# Patient Record
Sex: Male | Born: 1965 | Race: Black or African American | Hispanic: No | Marital: Single | Smoking: Never smoker
Health system: Southern US, Community
[De-identification: ages and names within clinical notes are randomized; demographics above are authoritative.]

## PROBLEM LIST (undated history)

## (undated) DIAGNOSIS — E119 Type 2 diabetes mellitus without complications: Secondary | ICD-10-CM

## (undated) DIAGNOSIS — R51 Headache: Secondary | ICD-10-CM

## (undated) DIAGNOSIS — K219 Gastro-esophageal reflux disease without esophagitis: Secondary | ICD-10-CM

## (undated) DIAGNOSIS — I10 Essential (primary) hypertension: Secondary | ICD-10-CM

## (undated) DIAGNOSIS — E785 Hyperlipidemia, unspecified: Secondary | ICD-10-CM

## (undated) HISTORY — DX: Hyperlipidemia, unspecified: E78.5

## (undated) HISTORY — PX: HERNIA REPAIR: SHX51

## (undated) HISTORY — DX: Gastro-esophageal reflux disease without esophagitis: K21.9

---

## 1998-07-25 ENCOUNTER — Emergency Department (HOSPITAL_COMMUNITY): Admission: EM | Admit: 1998-07-25 | Discharge: 1998-07-25 | Payer: Self-pay | Admitting: Emergency Medicine

## 1999-08-30 ENCOUNTER — Emergency Department (HOSPITAL_COMMUNITY): Admission: EM | Admit: 1999-08-30 | Discharge: 1999-08-30 | Payer: Self-pay | Admitting: Emergency Medicine

## 1999-10-03 ENCOUNTER — Emergency Department (HOSPITAL_COMMUNITY): Admission: EM | Admit: 1999-10-03 | Discharge: 1999-10-03 | Payer: Self-pay

## 2000-02-15 ENCOUNTER — Emergency Department (HOSPITAL_COMMUNITY): Admission: EM | Admit: 2000-02-15 | Discharge: 2000-02-16 | Payer: Self-pay | Admitting: Emergency Medicine

## 2000-02-15 ENCOUNTER — Encounter: Payer: Self-pay | Admitting: Emergency Medicine

## 2000-10-26 ENCOUNTER — Emergency Department (HOSPITAL_COMMUNITY): Admission: EM | Admit: 2000-10-26 | Discharge: 2000-10-26 | Payer: Self-pay | Admitting: Emergency Medicine

## 2001-06-11 ENCOUNTER — Emergency Department (HOSPITAL_COMMUNITY): Admission: EM | Admit: 2001-06-11 | Discharge: 2001-06-11 | Payer: Self-pay | Admitting: Emergency Medicine

## 2003-04-22 ENCOUNTER — Emergency Department (HOSPITAL_COMMUNITY): Admission: EM | Admit: 2003-04-22 | Discharge: 2003-04-22 | Payer: Self-pay | Admitting: Emergency Medicine

## 2004-02-28 ENCOUNTER — Emergency Department (HOSPITAL_COMMUNITY): Admission: EM | Admit: 2004-02-28 | Discharge: 2004-02-29 | Payer: Self-pay | Admitting: Emergency Medicine

## 2005-06-06 ENCOUNTER — Emergency Department (HOSPITAL_COMMUNITY): Admission: EM | Admit: 2005-06-06 | Discharge: 2005-06-06 | Payer: Self-pay | Admitting: Emergency Medicine

## 2007-03-12 ENCOUNTER — Emergency Department (HOSPITAL_COMMUNITY): Admission: EM | Admit: 2007-03-12 | Discharge: 2007-03-12 | Payer: Self-pay | Admitting: Emergency Medicine

## 2007-03-17 ENCOUNTER — Emergency Department (HOSPITAL_COMMUNITY): Admission: EM | Admit: 2007-03-17 | Discharge: 2007-03-17 | Payer: Self-pay | Admitting: Emergency Medicine

## 2007-04-06 ENCOUNTER — Encounter: Admission: RE | Admit: 2007-04-06 | Discharge: 2007-04-06 | Payer: Self-pay | Admitting: Cardiology

## 2007-04-23 ENCOUNTER — Emergency Department (HOSPITAL_COMMUNITY): Admission: EM | Admit: 2007-04-23 | Discharge: 2007-04-23 | Payer: Self-pay | Admitting: Emergency Medicine

## 2009-02-18 ENCOUNTER — Emergency Department (HOSPITAL_COMMUNITY): Admission: EM | Admit: 2009-02-18 | Discharge: 2009-02-19 | Payer: Self-pay | Admitting: Emergency Medicine

## 2009-02-25 ENCOUNTER — Ambulatory Visit: Payer: Self-pay | Admitting: Family Medicine

## 2009-02-25 DIAGNOSIS — N400 Enlarged prostate without lower urinary tract symptoms: Secondary | ICD-10-CM

## 2009-02-25 DIAGNOSIS — F329 Major depressive disorder, single episode, unspecified: Secondary | ICD-10-CM

## 2009-02-25 DIAGNOSIS — E785 Hyperlipidemia, unspecified: Secondary | ICD-10-CM | POA: Insufficient documentation

## 2009-02-25 DIAGNOSIS — I10 Essential (primary) hypertension: Secondary | ICD-10-CM

## 2009-03-26 ENCOUNTER — Encounter (INDEPENDENT_AMBULATORY_CARE_PROVIDER_SITE_OTHER): Payer: Self-pay | Admitting: Family Medicine

## 2009-03-26 ENCOUNTER — Ambulatory Visit: Payer: Self-pay | Admitting: Internal Medicine

## 2009-03-26 LAB — CONVERTED CEMR LAB
ALT: 18 units/L (ref 0–53)
AST: 16 units/L (ref 0–37)
Albumin: 4.5 g/dL (ref 3.5–5.2)
Alkaline Phosphatase: 61 units/L (ref 39–117)
BUN: 11 mg/dL (ref 6–23)
Basophils Absolute: 0 10*3/uL (ref 0.0–0.1)
Basophils Relative: 0 % (ref 0–1)
Bilirubin, Direct: 0.1 mg/dL (ref 0.0–0.3)
CO2: 27 meq/L (ref 19–32)
Calcium: 9.9 mg/dL (ref 8.4–10.5)
Chloride: 103 meq/L (ref 96–112)
Cholesterol: 140 mg/dL (ref 0–200)
Creatinine, Ser: 1.4 mg/dL (ref 0.40–1.50)
Eosinophils Absolute: 0.1 10*3/uL (ref 0.0–0.7)
Eosinophils Relative: 2 % (ref 0–5)
Glucose, Bld: 120 mg/dL — ABNORMAL HIGH (ref 70–99)
HCT: 43.8 % (ref 39.0–52.0)
HDL: 28 mg/dL — ABNORMAL LOW (ref >39)
Hemoglobin: 15.6 g/dL (ref 13.0–17.0)
Indirect Bilirubin: 0.5 mg/dL (ref 0.0–0.9)
LDL Cholesterol: 65 mg/dL (ref 0–99)
Lymphocytes Relative: 24 % (ref 12–46)
Lymphs Abs: 1.1 10*3/uL (ref 0.7–4.0)
MCHC: 35.6 g/dL (ref 30.0–36.0)
MCV: 91.8 fL (ref 78.0–100.0)
Monocytes Absolute: 0.3 10*3/uL (ref 0.1–1.0)
Monocytes Relative: 7 % (ref 3–12)
Neutro Abs: 3.2 10*3/uL (ref 1.7–7.7)
Neutrophils Relative %: 67 % (ref 43–77)
PSA: 1.95 ng/mL (ref 0.10–4.00)
Platelets: 224 10*3/uL (ref 150–400)
Potassium: 4.2 meq/L (ref 3.5–5.3)
RBC: 4.77 M/uL (ref 4.22–5.81)
RDW: 13.3 % (ref 11.5–15.5)
Sodium: 142 meq/L (ref 135–145)
TSH: 1.321 microintl units/mL (ref 0.350–4.500)
Total Bilirubin: 0.6 mg/dL (ref 0.3–1.2)
Total CHOL/HDL Ratio: 5
Total Protein: 7.2 g/dL (ref 6.0–8.3)
Triglycerides: 237 mg/dL — ABNORMAL HIGH (ref <150)
VLDL: 47 mg/dL — ABNORMAL HIGH (ref 0–40)
WBC: 4.8 10*3/uL (ref 4.0–10.5)

## 2009-03-28 ENCOUNTER — Ambulatory Visit: Payer: Self-pay | Admitting: Family Medicine

## 2009-03-28 DIAGNOSIS — K219 Gastro-esophageal reflux disease without esophagitis: Secondary | ICD-10-CM

## 2009-03-28 LAB — CONVERTED CEMR LAB: Hgb A1c MFr Bld: 5.5 % (ref 4.6–6.1)

## 2009-04-16 ENCOUNTER — Ambulatory Visit: Payer: Self-pay | Admitting: Internal Medicine

## 2009-05-02 ENCOUNTER — Ambulatory Visit: Payer: Self-pay | Admitting: Internal Medicine

## 2009-05-03 ENCOUNTER — Ambulatory Visit: Payer: Self-pay | Admitting: Internal Medicine

## 2009-05-20 ENCOUNTER — Ambulatory Visit: Payer: Self-pay | Admitting: Internal Medicine

## 2009-05-20 ENCOUNTER — Encounter (INDEPENDENT_AMBULATORY_CARE_PROVIDER_SITE_OTHER): Payer: Self-pay | Admitting: Family Medicine

## 2009-05-20 LAB — CONVERTED CEMR LAB
Helicobacter Pylori Antibody-IgG: 0.5
Testosterone: 372.21 ng/dL (ref 350–890)

## 2009-05-28 ENCOUNTER — Ambulatory Visit: Payer: Self-pay | Admitting: Internal Medicine

## 2009-06-17 ENCOUNTER — Ambulatory Visit: Payer: Self-pay | Admitting: Internal Medicine

## 2009-07-29 ENCOUNTER — Ambulatory Visit: Payer: Self-pay | Admitting: Internal Medicine

## 2009-08-10 ENCOUNTER — Emergency Department (HOSPITAL_COMMUNITY): Admission: EM | Admit: 2009-08-10 | Discharge: 2009-08-11 | Payer: Self-pay | Admitting: Emergency Medicine

## 2009-08-15 ENCOUNTER — Emergency Department (HOSPITAL_COMMUNITY): Admission: EM | Admit: 2009-08-15 | Discharge: 2009-08-15 | Payer: Self-pay | Admitting: Emergency Medicine

## 2009-10-07 ENCOUNTER — Emergency Department (HOSPITAL_COMMUNITY): Admission: EM | Admit: 2009-10-07 | Discharge: 2009-10-08 | Payer: Self-pay | Admitting: Emergency Medicine

## 2009-10-10 ENCOUNTER — Ambulatory Visit: Payer: Self-pay | Admitting: Internal Medicine

## 2009-11-15 ENCOUNTER — Emergency Department (HOSPITAL_COMMUNITY): Admission: EM | Admit: 2009-11-15 | Discharge: 2009-11-16 | Payer: Self-pay | Admitting: Emergency Medicine

## 2009-11-17 ENCOUNTER — Emergency Department (HOSPITAL_COMMUNITY): Admission: EM | Admit: 2009-11-17 | Discharge: 2009-11-17 | Payer: Self-pay | Admitting: Emergency Medicine

## 2009-12-26 ENCOUNTER — Encounter (INDEPENDENT_AMBULATORY_CARE_PROVIDER_SITE_OTHER): Payer: Self-pay | Admitting: Family Medicine

## 2009-12-26 LAB — CONVERTED CEMR LAB
BUN: 12 mg/dL (ref 6–23)
CO2: 30 meq/L (ref 19–32)
Calcium: 9.8 mg/dL (ref 8.4–10.5)
Chloride: 102 meq/L (ref 96–112)
Creatinine, Ser: 1.34 mg/dL (ref 0.40–1.50)
Glucose, Bld: 97 mg/dL (ref 70–99)
Hgb A1c MFr Bld: 5.6 % (ref <5.7)
Potassium: 4.3 meq/L (ref 3.5–5.3)
Sodium: 142 meq/L (ref 135–145)

## 2010-02-18 ENCOUNTER — Emergency Department (HOSPITAL_COMMUNITY)
Admission: EM | Admit: 2010-02-18 | Discharge: 2010-02-18 | Payer: Self-pay | Source: Home / Self Care | Admitting: Family Medicine

## 2010-04-13 ENCOUNTER — Encounter: Payer: Self-pay | Admitting: Cardiology

## 2010-04-22 NOTE — Letter (Signed)
Summary: SLIDING SCALE FEE  SLIDING SCALE FEE   Imported By: Arta Bruce 04/15/2009 16:58:47  _____________________________________________________________________  External Attachment:    Type:   Image     Comment:   External Document

## 2010-04-22 NOTE — Miscellaneous (Signed)
Summary: Office Visit ([facility])    Primary Provider:  Carolyne Fiscal   History of Present Illness: H/o GERD, some substernal chest discomfort after heavy meals while sitting around. Not active, no CP when active.  H/o chronic prostatitis. 2 days left on 4 weeks of Cipro, pt states improved. Recent PSA 1.95. Continues with hesitancy, dribbling, poor flow, polyuria, q 2 hours, no nocturia.  Allergies: No Known Drug Allergies  Past History:  Past Medical History: Last updated: 02/25/2009 Hypertension Hyperlipidemia Depression  Past Surgical History: Last updated: 02/25/2009 Umbilical hernia repair as a child.  Family History: Last updated: 02/25/2009 Father alive with Diabetes Mother alive with Hyperlipidemia Family History Diabetes 1st degree relative  Social History: Last updated: 02/25/2009 Occupation: Unemployed Lives with parents currently, good support Single Never Smoked Alcohol use-no Drug use-no Regular exercise-no  Risk Factors: Caffeine Use: 5-10 cups daily (02/25/2009) Exercise: no (02/25/2009)  Risk Factors: Smoking Status: never (02/25/2009) Passive Smoke Exposure: no (02/25/2009)   Review of Systems General:  Denies chills and fever. GU:  Complains of erectile dysfunction, urinary frequency, and urinary hesitancy; denies discharge, dysuria, genital sores, hematuria, incontinence, and nocturia.   Physical Exam  General:  Well-developed,well-nourished,in no acute distress; alert,appropriate and cooperative throughout examination Head:  Normocephalic and atraumatic without obvious abnormalities. No apparent alopecia or balding. Eyes:  No corneal or conjunctival inflammation noted. EOMI. Perrla.  Ears:  External ear exam shows no significant lesions or deformities.  Otoscopic examination reveals clear canals, tympanic membranes are intact bilaterally without bulging, retraction, inflammation or discharge. Hearing is grossly normal bilaterally. Nose:   External nasal examination shows no deformity or inflammation. Nasal mucosa are pink and moist without lesions or exudates. Mouth:  Oral mucosa and oropharynx without lesions or exudates.  Teeth in good repair. Lungs:  Normal respiratory effort, chest expands symmetrically. Lungs are clear to auscultation, no crackles or wheezes. Heart:  Normal rate and regular rhythm. S1 and S2 normal without gallop, murmur, click, rub or other extra sounds. Abdomen:  Bowel sounds positive,abdomen soft and non-tender without masses, organomegaly or hernias noted.   Problems:  Medical Problems Added: 1)  Dx of Gerd  (ICD-530.81)  Impression & Recommendations:  Problem # 1:  CHRONIC PROSTATITIS (ICD-601.1) Finishing 28 day course of cipro with some improvement. Begin Cardura 4mg  today. Cautions given re: SE.  Problem # 2:  HYPERLIPIDEMIA (ICD-272.4) Diet and exercise. Increase whole plants, decrease meat, esp red meats. Exercise daily. Repeat Lipids after 6months.  Problem # 3:  FAMILY HISTORY DIABETES 1ST DEGREE RELATIVE (ICD-V18.0) FBG 120 recently. Hgb A1c today.  Problem # 4:  GERD (ICD-530.81)  Omeprazole 20mg  one month trial, then trial off with food avoidance.  His updated medication list for this problem includes:    Omeprazole 20 Mg Cpdr (Omeprazole) .Marland Kitchen... 1 by mouth daily  Complete Medication List: 1)  Cipro 500 Mg Tabs (Ciprofloxacin hcl) .Marland Kitchen.. 1 by mouth two times a day x 28 days 2)  Cardura 4 Mg Tabs (Doxazosin mesylate) .Marland Kitchen.. 1 by mouth qam 3)  Omeprazole 20 Mg Cpdr (Omeprazole) .Marland Kitchen.. 1 by mouth daily  Prescriptions: OMEPRAZOLE 20 MG CPDR (OMEPRAZOLE) 1 by mouth daily  #30 x 0   Entered and Authorized by:   Syliva Overman MD   Signed by:   Syliva Overman MD on 03/28/2009   Method used:   Print then Give to Patient   RxID:   [id number] CARDURA 4 MG TABS (DOXAZOSIN MESYLATE) 1 by mouth qAM  #30 x 2  Entered and Authorized by:   Syliva Overman MD   Signed by:   Syliva Overman MD on  03/28/2009   Method used:   Print then Give to Patient   RxID:   [id number]

## 2010-04-22 NOTE — Progress Notes (Signed)
Summary: Office Visit//DEPRESSION SCREENING  Office Visit//DEPRESSION SCREENING   Imported By: Arta Bruce 04/15/2009 16:59:38  _____________________________________________________________________  External Attachment:    Type:   Image     Comment:   External Document

## 2010-04-22 NOTE — Letter (Signed)
Summary: PT INFORMATION SHEET  PT INFORMATION SHEET   Imported By: Arta Bruce 04/15/2009 16:56:36  _____________________________________________________________________  External Attachment:    Type:   Image     Comment:   External Document

## 2010-05-13 ENCOUNTER — Encounter (INDEPENDENT_AMBULATORY_CARE_PROVIDER_SITE_OTHER): Payer: Self-pay | Admitting: *Deleted

## 2010-05-13 LAB — CONVERTED CEMR LAB
Cholesterol: 140 mg/dL (ref 0–200)
HDL: 29 mg/dL — ABNORMAL LOW (ref >39)
LDL Cholesterol: 68 mg/dL (ref 0–99)
PSA: 1.49 ng/mL (ref <=4.00)
Total CHOL/HDL Ratio: 4.8
Triglycerides: 215 mg/dL — ABNORMAL HIGH (ref <150)
VLDL: 43 mg/dL — ABNORMAL HIGH (ref 0–40)

## 2010-06-09 LAB — CULTURE, ROUTINE-ABSCESS

## 2010-06-25 LAB — URINALYSIS, ROUTINE W REFLEX MICROSCOPIC
Glucose, UA: NEGATIVE mg/dL
Ketones, ur: NEGATIVE mg/dL
Protein, ur: NEGATIVE mg/dL

## 2010-09-27 ENCOUNTER — Emergency Department (HOSPITAL_COMMUNITY)
Admission: EM | Admit: 2010-09-27 | Discharge: 2010-09-28 | Disposition: A | Discharge status: Home / Self Care | Payer: Self-pay | Attending: Emergency Medicine | Admitting: Emergency Medicine

## 2010-09-27 DIAGNOSIS — R300 Dysuria: Secondary | ICD-10-CM | POA: Insufficient documentation

## 2010-09-27 DIAGNOSIS — N419 Inflammatory disease of prostate, unspecified: Secondary | ICD-10-CM | POA: Insufficient documentation

## 2010-09-28 LAB — URINALYSIS, ROUTINE W REFLEX MICROSCOPIC
Bilirubin Urine: NEGATIVE
Glucose, UA: NEGATIVE mg/dL
Ketones, ur: NEGATIVE mg/dL
Nitrite: NEGATIVE
pH: 6 (ref 5.0–8.0)

## 2010-09-28 LAB — URINE MICROSCOPIC-ADD ON

## 2010-09-29 LAB — URINE CULTURE
Colony Count: NO GROWTH
Culture  Setup Time: 201207081126

## 2010-12-04 ENCOUNTER — Emergency Department (HOSPITAL_COMMUNITY)
Admission: EM | Admit: 2010-12-04 | Discharge: 2010-12-04 | Disposition: A | Discharge status: Home / Self Care | Payer: Self-pay | Attending: Emergency Medicine | Admitting: Emergency Medicine

## 2010-12-04 DIAGNOSIS — K219 Gastro-esophageal reflux disease without esophagitis: Secondary | ICD-10-CM | POA: Insufficient documentation

## 2010-12-04 DIAGNOSIS — M549 Dorsalgia, unspecified: Secondary | ICD-10-CM | POA: Insufficient documentation

## 2010-12-11 LAB — GC/CHLAMYDIA PROBE AMP, GENITAL: Chlamydia, DNA Probe: NEGATIVE

## 2010-12-11 LAB — URINALYSIS, ROUTINE W REFLEX MICROSCOPIC
Bilirubin Urine: NEGATIVE
Glucose, UA: NEGATIVE
Hgb urine dipstick: NEGATIVE
Specific Gravity, Urine: 1.02
pH: 6.5

## 2010-12-26 LAB — URINALYSIS, ROUTINE W REFLEX MICROSCOPIC
Bilirubin Urine: NEGATIVE
Glucose, UA: NEGATIVE
Hgb urine dipstick: NEGATIVE
Ketones, ur: NEGATIVE
Nitrite: NEGATIVE
Protein, ur: NEGATIVE
Specific Gravity, Urine: 1.023
Urobilinogen, UA: 0.2
pH: 6

## 2010-12-26 LAB — GC/CHLAMYDIA PROBE AMP, GENITAL
Chlamydia, DNA Probe: NEGATIVE
GC Probe Amp, Genital: NEGATIVE

## 2010-12-26 LAB — URINE CULTURE
Colony Count: NO GROWTH
Culture: NO GROWTH

## 2011-03-07 ENCOUNTER — Emergency Department (INDEPENDENT_AMBULATORY_CARE_PROVIDER_SITE_OTHER)
Admission: EM | Admit: 2011-03-07 | Discharge: 2011-03-07 | Disposition: A | Discharge status: Home / Self Care | Payer: BC Managed Care – PPO | Source: Home / Self Care | Attending: Family Medicine | Admitting: Family Medicine

## 2011-03-07 ENCOUNTER — Encounter: Payer: Self-pay | Admitting: *Deleted

## 2011-03-07 DIAGNOSIS — T50905A Adverse effect of unspecified drugs, medicaments and biological substances, initial encounter: Secondary | ICD-10-CM

## 2011-03-07 DIAGNOSIS — I10 Essential (primary) hypertension: Secondary | ICD-10-CM

## 2011-03-07 DIAGNOSIS — T887XXA Unspecified adverse effect of drug or medicament, initial encounter: Secondary | ICD-10-CM

## 2011-03-07 HISTORY — DX: Essential (primary) hypertension: I10

## 2011-03-07 LAB — POCT I-STAT, CHEM 8
BUN: 11 mg/dL (ref 6–23)
Calcium, Ion: 1.19 mmol/L (ref 1.12–1.32)
Chloride: 103 mEq/L (ref 96–112)
Potassium: 4.2 mEq/L (ref 3.5–5.1)

## 2011-03-07 MED ORDER — DOXAZOSIN MESYLATE 8 MG PO TABS: 4.0000 mg | ORAL_TABLET | Freq: Every day | ORAL | Status: DC

## 2011-03-07 NOTE — ED Notes (Signed)
Pt with c/o dizziness and headache onset Monday - per pt had prescription transferred from one pharmacy to another realized was taking 8 mg rather than 4 mg stopped taking medication x 2 days ago

## 2011-03-07 NOTE — ED Provider Notes (Signed)
History     CSN: [id number] Arrival date & time: 03/07/2011  1:15 PM   First MD Initiated Contact with Patient 03/07/11 1230      Chief Complaint  Patient presents with  . Headache    per pt refilled doxazosin 8mg  on 12/3 started taking 12/4 started feeling dizzy 12/10 - stopped taking med thursday called pharmacy told had been taking 4mg  everyday increased to  8 - pharmacist told pt to take 1/2 tablet and call his doctor on monday   . Dizziness    (Consider location/radiation/quality/duration/timing/severity/associated sxs/prior treatment) Patient is a 45 y.o. male presenting with headaches. The history is provided by the patient.  Headache The primary symptoms include headaches. Primary symptoms do not include dizziness, focal weakness, memory loss, fever, nausea or vomiting. The symptoms began 3 to 5 days ago (found to be given higher dose of medicine from pharmacy. ). The symptoms are improving.  Medical issues also include hypertension.    Past Medical History  Diagnosis Date  . Hypertension     History reviewed. No pertinent past surgical history.  History reviewed. No pertinent family history.  History  Substance Use Topics  . Smoking status: Never Smoker   . Smokeless tobacco: Not on file  . Alcohol Use: No      Review of Systems  Constitutional: Negative for fever.  Respiratory: Negative.   Cardiovascular: Negative.   Gastrointestinal: Negative.  Negative for nausea and vomiting.  Neurological: Positive for headaches. Negative for dizziness and focal weakness.  Psychiatric/Behavioral: Negative for memory loss.    Allergies  Review of patient's allergies indicates no known allergies.  Home Medications   Current Outpatient Rx  Name Route Sig Dispense Refill  . IBUPROFEN 800 MG PO TABS Oral Take 800 mg by mouth every 8 (eight) hours as needed.      Marland Kitchen DOXAZOSIN MESYLATE 8 MG PO TABS Oral Take 0.5 tablets (4 mg total) by mouth at bedtime. 30 tablet 0     BP 126/70  Pulse 68  Temp(Src) 98.4 F (36.9 C) (Oral)  Resp 16  SpO2 96%  Physical Exam  Nursing note and vitals reviewed. Constitutional: He is oriented to person, place, and time. He appears well-developed and well-nourished.  HENT:  Head: Normocephalic.  Right Ear: External ear normal.  Left Ear: External ear normal.  Mouth/Throat: Oropharynx is clear and moist.  Eyes: Conjunctivae and EOM are normal. Pupils are equal, round, and reactive to light.  Neck: Normal range of motion. Neck supple.  Cardiovascular: Normal rate, normal heart sounds and intact distal pulses.   Pulmonary/Chest: Effort normal and breath sounds normal.  Musculoskeletal: He exhibits no edema.  Lymphadenopathy:    He has no cervical adenopathy.  Neurological: He is alert and oriented to person, place, and time.  Skin: Skin is warm and dry.  Psychiatric: He has a normal mood and affect.    ED Course  Procedures (including critical care time)  Labs Reviewed  POCT I-STAT, CHEM 8 - Abnormal; Notable for the following:    Creatinine, Ser 1.40 (*)    Glucose, Bld 105 (*)    All other components within normal limits  I-STAT, CHEM 8   No results found.   1. Hypertension   2. Medication adverse effect       MDM  i-stat wnl except for chronic increased cr.        Barkley Bruns, MD 03/07/11 [phone]

## 2012-08-01 ENCOUNTER — Emergency Department (HOSPITAL_COMMUNITY)
Admission: EM | Admit: 2012-08-01 | Discharge: 2012-08-01 | Disposition: A | Discharge status: Home / Self Care | Payer: BC Managed Care – PPO | Source: Home / Self Care | Attending: Emergency Medicine | Admitting: Emergency Medicine

## 2012-08-01 ENCOUNTER — Encounter (HOSPITAL_COMMUNITY): Payer: Self-pay | Admitting: *Deleted

## 2012-08-01 MED ORDER — GI COCKTAIL ~~LOC~~
ORAL | Status: AC
  Filled 2012-08-01: qty 30

## 2012-08-01 MED ORDER — GI COCKTAIL ~~LOC~~
30.0000 mL | Freq: Once | ORAL | Status: AC
  Administered 2012-08-01: 30 mL via ORAL

## 2012-08-01 MED ORDER — OMEPRAZOLE 20 MG PO CPDR: 20.0000 mg | DELAYED_RELEASE_CAPSULE | Freq: Two times a day (BID) | ORAL | Status: DC

## 2012-08-01 MED ORDER — SUCRALFATE 1 GM/10ML PO SUSP: 1.0000 g | Freq: Four times a day (QID) | ORAL | Status: DC

## 2012-08-01 MED ORDER — RANITIDINE HCL 150 MG PO TABS: 150.0000 mg | ORAL_TABLET | Freq: Two times a day (BID) | ORAL | Status: DC

## 2012-08-01 NOTE — ED Provider Notes (Signed)
Chief Complaint:   Chief Complaint  Patient presents with  . Abdominal Pain    History of Present Illness:    Austin Valencia is a 47 year old male who has had a four-day history of difficulty swallowing. He describes pain in the upper sternal area with swelling of any solid food or burping. This does not occur with swallowing of liquids or saliva. The pain does not radiate. He denies any dysphagia, nausea, or vomiting. He's had a history of indigestion and heartburn. He has been on Zantac for this, but ran out of medication recently. He denies any sores on his lips or in his mouth. He is in the habit of dry swallowing his pills, and one of the pills that he does try to swallow without any water is potassium. He also takes ibuprofen. He drinks occasional alcohol and does not use any tobacco or nicotine products. He's had no fever, chills, sore throat, adenopathy, difficulty breathing, pain with inspiration, palpitations, dizziness, abdominal pain, or evidence of GI bleeding.  Review of Systems:  Other than noted above, the patient denies any of the following symptoms: Constitutional:  No fever, chills, fatigue, weight loss or anorexia. Lungs:  No cough or shortness of breath. Heart:  No chest pain, palpitations, syncope or edema.  No cardiac history. Abdomen:  No nausea, vomiting, hematememesis, melena, diarrhea, or hematochezia. GU:  No dysuria, frequency, urgency, or hematuria.  No testicular pain or swelling.  PMFSH:  Past medical history, family history, social history, meds, and allergies were reviewed along with nurse's notes.  No prior abdominal surgeries or history of GI problems. No excessive  alcohol intake.  Physical Exam:   Vital signs:  BP 130/70  Pulse 72  Temp(Src) 98.6 F (37 C) (Oral)  Resp 20  SpO2 100% Gen:  Alert, oriented, in no distress. ENT: Pharynx is clear, no oral ulcerations or ulcerations of lips. Neck: No mass or tenderness or adenopathy. Lungs:  Breath sounds  clear and equal bilaterally.  No wheezes, rales or rhonchi. Heart:  Regular rhythm.  No gallops or murmers.   Abdomen:  Soft, nontender, no organomegaly or mass. Bowel sounds are normally active. No pulsatile midline abdominal mass or bruit. Skin:  Clear, warm and dry.  No rash.  Course in Urgent Care Center:   He was given 30 cc of GI cocktail.  Assessment:  The encounter diagnosis was Esophagitis.  This could be due to acid reflux or possibly due to having gotten a pill stuck in his esophagus.  Plan:   1.  The following meds were prescribed:   New Prescriptions   OMEPRAZOLE (PRILOSEC) 20 MG CAPSULE    Take 1 capsule (20 mg total) by mouth 2 (two) times daily before a meal.   RANITIDINE (ZANTAC) 150 MG TABLET    Take 1 tablet (150 mg total) by mouth 2 (two) times daily.   SUCRALFATE (CARAFATE) 1 GM/10ML SUSPENSION    Take 10 mLs (1 g total) by mouth 4 (four) times daily.   2.  The patient was instructed in symptomatic care and handouts were given. He was warned not to dry swallow pills but to use a full glass of water. Also discussed antireflux diet with him as well. 3.  The patient was told to return if becoming worse in any way, if no better in 3 or 4 days, and given some red flag symptoms such as worsening pain, food sticking in esophagus, or difficulty breathing that would indicate earlier return. 4.  Follow  up with his primary care physician, Dr. Sharyn Lull in 2 weeks.     Reuben Likes, MD 08/01/12 [phone]

## 2012-08-01 NOTE — ED Notes (Signed)
Pt  Reports  Reports    Epigastric  Pain   Which  Started  About  3  Days  Ago    He    Reports  The          Pain  Is  Worse  After  Eating            He  Has  A        History  Of  gerd

## 2012-09-05 ENCOUNTER — Encounter (INDEPENDENT_AMBULATORY_CARE_PROVIDER_SITE_OTHER): Payer: Self-pay | Admitting: General Surgery

## 2012-09-05 ENCOUNTER — Ambulatory Visit (INDEPENDENT_AMBULATORY_CARE_PROVIDER_SITE_OTHER): Payer: Self-pay | Admitting: General Surgery

## 2012-09-05 ENCOUNTER — Ambulatory Visit (INDEPENDENT_AMBULATORY_CARE_PROVIDER_SITE_OTHER): Payer: BC Managed Care – PPO | Admitting: General Surgery

## 2012-09-05 VITALS — BP 130/68 | HR 80 | Temp 97.0°F | Resp 16 | Ht 67.0 in | Wt 227.4 lb

## 2012-09-05 DIAGNOSIS — R2231 Localized swelling, mass and lump, right upper limb: Secondary | ICD-10-CM

## 2012-09-05 DIAGNOSIS — R229 Localized swelling, mass and lump, unspecified: Secondary | ICD-10-CM

## 2012-09-05 NOTE — Progress Notes (Signed)
Patient ID: Austin Valencia, male   DOB: 1966-03-14, 47 y.o.   MRN: [id number]  Chief Complaint  Patient presents with  . 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 male.  The patient is a 47 year old male with a to 4 year history of a right axillary mass. The patient states that it had grown bigger one point has now been stable in size. The patient does have some pain in the area. He states has been no drainage from the area ever.  HPI  Past Medical History  Diagnosis Date  . Hypertension   . GERD (gastroesophageal reflux disease)   . Hyperlipidemia     History reviewed. No pertinent past surgical history.  Family History  Problem Relation Age of Onset  . Cancer Father     lung    Social History History  Substance Use Topics  . Smoking status: Never Smoker   . Smokeless tobacco: Never Used  . Alcohol Use: No    No Known Allergies  Current Outpatient Prescriptions  Medication Sig Dispense Refill  . doxazosin (CARDURA) 8 MG tablet Take 0.5 tablets (4 mg total) by mouth at bedtime.  30 tablet  0  . ibuprofen (ADVIL,MOTRIN) 800 MG tablet Take 800 mg by mouth every 8 (eight) hours as needed.        Marland Kitchen omeprazole (PRILOSEC) 20 MG capsule Take 1 capsule (20 mg total) by mouth 2 (two) times daily before a meal.  30 capsule  0  . ranitidine (ZANTAC) 150 MG tablet Take 1 tablet (150 mg total) by mouth 2 (two) times daily.  60 tablet  12   No current facility-administered medications for this visit.    Review of Systems Review of Systems  Constitutional: Negative.   HENT: Negative.   Respiratory: Negative.   Cardiovascular: Negative.   Gastrointestinal: Negative.   Endocrine: Negative.   Neurological: Negative.   All other systems reviewed and are negative.    Blood pressure 130/68, pulse 80, temperature 97 F (36.1 C), temperature source Temporal, resp. rate 16, height 5\' 7"  (1.702 m), weight 227 lb 6.4 oz (103.148 kg).  Physical  Exam Physical Exam  Constitutional: He is oriented to person, place, and time. He appears well-developed and well-nourished.  HENT:  Head: Normocephalic and atraumatic.  Eyes: Conjunctivae and EOM are normal. Pupils are equal, round, and reactive to light.  Neck: Normal range of motion. Neck supple.  Cardiovascular: Normal rate, regular rhythm and normal heart sounds.   Pulmonary/Chest: Effort normal and breath sounds normal.  Abdominal: Soft.  Musculoskeletal: Normal range of motion.  Neurological: He is alert and oriented to person, place, and time.  Skin: Skin is warm and dry.       Data Reviewed none  Assessment    47 year old male with a right axillary mass likely lipoma     Plan    1. We'll proceed to the operating room for an excision of right axillary mass. 2. We discussed risks and benefits to include infection, bleeding, damage to structures, possible further workup and operations. The patient voiced understanding and wished to proceed with the procedure.        Marigene Ehlers., Cooper Moroney 09/05/2012, 2:22 PM

## 2013-05-02 ENCOUNTER — Ambulatory Visit (INDEPENDENT_AMBULATORY_CARE_PROVIDER_SITE_OTHER): Payer: BC Managed Care – PPO | Admitting: Family Medicine

## 2013-05-02 VITALS — BP 118/68 | HR 80 | Temp 98.1°F | Resp 18 | Ht 67.0 in | Wt 236.2 lb

## 2013-05-02 DIAGNOSIS — D1739 Benign lipomatous neoplasm of skin and subcutaneous tissue of other sites: Secondary | ICD-10-CM

## 2013-05-02 DIAGNOSIS — D1720 Benign lipomatous neoplasm of skin and subcutaneous tissue of unspecified limb: Secondary | ICD-10-CM

## 2013-05-02 NOTE — Progress Notes (Signed)
Subjective: Patient has a growth in his right axilla for the last 3 years. He saw her orthopedic doctor about something was told to come over here for this. He does have a primary care. It is only tender if he squeezes harder. He has not had any drainage from it.  Objective: In the right axilla he has a obvious swollen area about the size of a small egg. It is very soft and fatty feeling. There is a small central puckering when squeezed hard,, but does not seem to be associated with any drainage pore  Assessment: Right axillary lipoma  Plan: Head Windell Hummingbird PA film look at this also. We agreed that is probably a lipoma. It must be inherent to the skin somehow centrally to cause that puckering.. Will refer to Gen. surgery to get this removed.

## 2013-05-02 NOTE — Patient Instructions (Signed)
Office should contact you in a few days to let you know about the referral to a general surgeon to get this evaluated. At that time you can ask them, touched the out-of-pocket costs would be for you. Also you can check with your insurance company. This is what we call a lipoma of the axilla

## 2013-05-15 ENCOUNTER — Ambulatory Visit (INDEPENDENT_AMBULATORY_CARE_PROVIDER_SITE_OTHER): Payer: BC Managed Care – PPO | Admitting: General Surgery

## 2013-05-21 ENCOUNTER — Emergency Department (HOSPITAL_COMMUNITY)
Admission: EM | Admit: 2013-05-21 | Discharge: 2013-05-21 | Disposition: A | Discharge status: Home / Self Care | Payer: BC Managed Care – PPO | Attending: Emergency Medicine | Admitting: Emergency Medicine

## 2013-05-21 ENCOUNTER — Encounter (HOSPITAL_COMMUNITY): Payer: Self-pay | Admitting: Emergency Medicine

## 2013-05-21 DIAGNOSIS — Z79899 Other long term (current) drug therapy: Secondary | ICD-10-CM | POA: Insufficient documentation

## 2013-05-21 DIAGNOSIS — D1779 Benign lipomatous neoplasm of other sites: Secondary | ICD-10-CM | POA: Insufficient documentation

## 2013-05-21 DIAGNOSIS — K219 Gastro-esophageal reflux disease without esophagitis: Secondary | ICD-10-CM | POA: Insufficient documentation

## 2013-05-21 DIAGNOSIS — D1720 Benign lipomatous neoplasm of skin and subcutaneous tissue of unspecified limb: Secondary | ICD-10-CM

## 2013-05-21 DIAGNOSIS — Z8639 Personal history of other endocrine, nutritional and metabolic disease: Secondary | ICD-10-CM | POA: Insufficient documentation

## 2013-05-21 DIAGNOSIS — Z862 Personal history of diseases of the blood and blood-forming organs and certain disorders involving the immune mechanism: Secondary | ICD-10-CM | POA: Insufficient documentation

## 2013-05-21 DIAGNOSIS — I10 Essential (primary) hypertension: Secondary | ICD-10-CM | POA: Insufficient documentation

## 2013-05-21 DIAGNOSIS — E119 Type 2 diabetes mellitus without complications: Secondary | ICD-10-CM

## 2013-05-21 HISTORY — DX: Type 2 diabetes mellitus without complications: E11.9

## 2013-05-21 NOTE — ED Notes (Signed)
Pt reports an abscess to his R axilla for the past two years, states that it has become more painful and has begun to drain in the past few weeks. Pt a&o x4, NAD noted at this time.

## 2013-05-21 NOTE — Discharge Instructions (Signed)
Read the information below.  You may return to the Emergency Department at any time for worsening condition or any new symptoms that concern you.  Please have your doctor recheck the area to make sure that it is a lipoma (benign fatty growth). If you develop redness, swelling, pus draining from the wound, or fevers greater than 100.4, return to the ER immediately for a recheck.     Lipoma A lipoma is a noncancerous (benign) tumor composed of fat cells. They are usually found under the skin (subcutaneous). A lipoma may occur in any tissue of the body that contains fat. Common areas for lipomas to appear include the back, shoulders, buttocks, and thighs. Lipomas are a very common soft tissue growth. They are soft and grow slowly. Most problems caused by a lipoma depend on where it is growing. DIAGNOSIS  A lipoma can be diagnosed with a physical exam. These tumors rarely become cancerous, but radiographic studies can help determine this for certain. Studies used may include:  Computerized X-ray scans (CT or CAT scan).  Computerized magnetic scans (MRI). TREATMENT  Small lipomas that are not causing problems may be watched. If a lipoma continues to enlarge or causes problems, removal is often the best treatment. Lipomas can also be removed to improve appearance. Surgery is done to remove the fatty cells and the surrounding capsule. Most often, this is done with medicine that numbs the area (local anesthetic). The removed tissue is examined under a microscope to make sure it is not cancerous. Keep all follow-up appointments with your caregiver. SEEK MEDICAL CARE IF:   The lipoma becomes larger or hard.  The lipoma becomes painful, red, or increasingly swollen. These could be signs of infection or a more serious condition. Document Released: 02/27/2002 Document Revised: 06/01/2011 Document Reviewed: 08/09/2009 [facility] Patient Information 2014 [place], [place].

## 2013-05-21 NOTE — ED Provider Notes (Signed)
CSN: [id number]     Arrival date & time 05/21/13  4098 History   First MD Initiated Contact with Patient 05/21/13 [phone]     Chief Complaint  Patient presents with  . Abscess     (Consider location/radiation/quality/duration/timing/severity/associated sxs/prior Treatment) HPI Patient reports he has had a lump under his right armpit for the past 2 years.  The lump causes him no pain.  He states it originally drained clear fluid but has not drained anything in two years.  Has seen his PCP who has told him it was a benign fatty tumor in the past but he saw a chiropractor two weeks ago who told him it might be a boil and he should be seen.  Denies fevers, drainage from the area.  Past Medical History  Diagnosis Date  . Hypertension   . GERD (gastroesophageal reflux disease)   . Hyperlipidemia    History reviewed. No pertinent past surgical history. Family History  Problem Relation Age of Onset  . Cancer Father     lung   History  Substance Use Topics  . Smoking status: Never Smoker   . Smokeless tobacco: Never Used  . Alcohol Use: No    Review of Systems  Constitutional: Negative for fever and chills.  Respiratory: Negative for shortness of breath.   Cardiovascular: Negative for chest pain.  Musculoskeletal: Negative for myalgias.  Skin: Negative for color change and wound.  Allergic/Immunologic: Negative for immunocompromised state.  Neurological: Negative for weakness and numbness.  Hematological: Does not bruise/bleed easily.  All other systems reviewed and are negative.      Allergies  Review of patient's allergies indicates no known allergies.  Home Medications   Current Outpatient Rx  Name  Route  Sig  Dispense  Refill  . doxazosin (CARDURA) 8 MG tablet   Oral   Take 0.5 tablets (4 mg total) by mouth at bedtime.   30 tablet   0   . losartan (COZAAR) 50 MG tablet   Oral   Take 50 mg by mouth daily.         . ranitidine (ZANTAC) 150 MG tablet   Oral  Take 1 tablet (150 mg total) by mouth 2 (two) times daily.   60 tablet   12    BP 128/87  Pulse 65  Temp(Src) 97.7 F (36.5 C) (Oral)  Resp 20  SpO2 94% Physical Exam  Nursing note and vitals reviewed. Constitutional: He appears well-developed and well-nourished. No distress.  HENT:  Head: Normocephalic and atraumatic.  Neck: Neck supple.  Pulmonary/Chest: Effort normal.  Neurological: He is alert.  Skin: He is not diaphoretic.  Right axilla with large soft swelling.  No erythema, edema, warmth, tenderness, discharge.  No induration or fluctuance.      ED Course  Procedures (including critical care time) Labs Review Labs Reviewed - No data to display Imaging Review No results found.   EKG Interpretation None      MDM   Final diagnoses:  Lipoma of axilla    Pt with large soft swelling of right axilla that is mobile and non nodular, not indurated or fluctant.  Consistent with lipoma.  No e/o abscess or cellulitis.  Pt reassured but asked to follow up with PCP to confirm diagnosis.  Discussed findings, treatment, and follow up  with patient.  Pt given return precautions.  Pt verbalizes understanding and agrees with plan.        Clayton Bibles, PA-C 05/21/13 1050

## 2013-05-23 ENCOUNTER — Telehealth (INDEPENDENT_AMBULATORY_CARE_PROVIDER_SITE_OTHER): Payer: Self-pay | Admitting: General Surgery

## 2013-05-23 ENCOUNTER — Encounter (INDEPENDENT_AMBULATORY_CARE_PROVIDER_SITE_OTHER): Payer: Self-pay | Admitting: General Surgery

## 2013-05-23 ENCOUNTER — Ambulatory Visit (INDEPENDENT_AMBULATORY_CARE_PROVIDER_SITE_OTHER): Payer: BC Managed Care – PPO | Admitting: General Surgery

## 2013-05-23 VITALS — BP 148/84 | HR 78 | Resp 16 | Ht 67.0 in | Wt 236.0 lb

## 2013-05-23 DIAGNOSIS — D1720 Benign lipomatous neoplasm of skin and subcutaneous tissue of unspecified limb: Secondary | ICD-10-CM | POA: Insufficient documentation

## 2013-05-23 DIAGNOSIS — D1739 Benign lipomatous neoplasm of skin and subcutaneous tissue of other sites: Secondary | ICD-10-CM

## 2013-05-23 NOTE — Telephone Encounter (Signed)
Pt made aware of [facility] financial obligation will call back to schedule  °

## 2013-05-23 NOTE — Patient Instructions (Signed)
Call when ready to schedule removal of lipoma

## 2013-05-23 NOTE — Progress Notes (Signed)
Patient ID: Austin Espinoza., male   DOB: Jul 29, 1965, 48 y.o.   MRN: [id number]  Chief Complaint  Patient presents with  . Other    Eval Lipoma rt axilla    HPI Austin Freund. is a 48 y.o. male.  We're asked to see the patient in consultation by Dr. Terrence Dupont to evaluate him for a lipoma of his right axilla. The patient is a 48 year old black male who says that he has had this mass in his right axilla for the last couple of years. He does not have any acute pain but it is in an unusual position and he does notice it. He denies any fevers or chills. He has no weakness of the right arm. It does not seem to change the whole lot of the last couple years  HPI  Past Medical History  Diagnosis Date  . Hypertension   . GERD (gastroesophageal reflux disease)   . Hyperlipidemia     History reviewed. No pertinent past surgical history.  Family History  Problem Relation Age of Onset  . Cancer Father     lung    Social History History  Substance Use Topics  . Smoking status: Never Smoker   . Smokeless tobacco: Never Used  . Alcohol Use: No    No Known Allergies  Current Outpatient Prescriptions  Medication Sig Dispense Refill  . doxazosin (CARDURA) 8 MG tablet Take 0.5 tablets (4 mg total) by mouth at bedtime.  30 tablet  0  . losartan (COZAAR) 50 MG tablet Take 50 mg by mouth daily.      . ranitidine (ZANTAC) 150 MG tablet Take 1 tablet (150 mg total) by mouth 2 (two) times daily.  60 tablet  12   No current facility-administered medications for this visit.    Review of Systems Review of Systems  Constitutional: Negative.   HENT: Negative.   Eyes: Negative.   Respiratory: Negative.   Cardiovascular: Negative.   Gastrointestinal: Negative.   Endocrine: Negative.   Genitourinary: Negative.   Musculoskeletal: Negative.   Skin: Negative.   Allergic/Immunologic: Negative.   Neurological: Negative.   Hematological: Negative.   Psychiatric/Behavioral: Negative.      Blood pressure 148/84, pulse 78, resp. rate 16, height 5\' 7"  (1.702 m), weight 236 lb (107.049 kg).  Physical Exam Physical Exam  Constitutional: He is oriented to person, place, and time. He appears well-developed and well-nourished.  HENT:  Head: Normocephalic and atraumatic.  Eyes: Conjunctivae and EOM are normal. Pupils are equal, round, and reactive to light.  Neck: Normal range of motion. Neck supple.  Cardiovascular: Normal rate, regular rhythm and normal heart sounds.   Pulmonary/Chest: Effort normal and breath sounds normal.  Abdominal: Soft. Bowel sounds are normal.  Musculoskeletal: Normal range of motion.  There is a 4 cm fatty lipoma centrally in the right axilla.  Neurological: He is alert and oriented to person, place, and time.  Skin: Skin is warm and dry.  Psychiatric: He has a normal mood and affect. His behavior is normal.    Data Reviewed As above  Assessment    The patient has a moderate sized lipoma in the right axilla. Because of its size and the fact that it does cause him some occasional discomfort a figure be reasonable to remove it. I have discussed the operation to remove the lipoma with him including the risks and benefits of surgery as well as some of the technical aspects and he understands and would like to  think about it before scheduling     Plan    The patient will call back when he is ready to schedule excision of the lipoma from his right axilla        TOTH III,PAUL S 05/23/2013, 9:45 AM

## 2013-05-24 NOTE — ED Provider Notes (Signed)
Medical screening examination/treatment/procedure(s) were performed by non-physician practitioner and as supervising physician I was immediately available for consultation/collaboration.   EKG Interpretation None        Orpah Greek, MD 05/24/13 1110

## 2013-11-16 ENCOUNTER — Other Ambulatory Visit (INDEPENDENT_AMBULATORY_CARE_PROVIDER_SITE_OTHER): Payer: Self-pay | Admitting: General Surgery

## 2013-11-24 ENCOUNTER — Encounter (HOSPITAL_COMMUNITY): Payer: Self-pay | Admitting: Pharmacy Technician

## 2013-11-28 ENCOUNTER — Encounter (HOSPITAL_COMMUNITY)
Admission: RE | Admit: 2013-11-28 | Discharge: 2013-11-28 | Disposition: A | Discharge status: Home / Self Care | Payer: BC Managed Care – PPO | Source: Ambulatory Visit | Attending: General Surgery | Admitting: General Surgery

## 2013-11-28 ENCOUNTER — Ambulatory Visit (HOSPITAL_COMMUNITY)
Admission: RE | Admit: 2013-11-28 | Discharge: 2013-11-28 | Disposition: A | Discharge status: Home / Self Care | Payer: BC Managed Care – PPO | Source: Ambulatory Visit | Attending: Anesthesiology | Admitting: Anesthesiology

## 2013-11-28 ENCOUNTER — Encounter (HOSPITAL_COMMUNITY): Payer: Self-pay

## 2013-11-28 DIAGNOSIS — K219 Gastro-esophageal reflux disease without esophagitis: Secondary | ICD-10-CM | POA: Diagnosis not present

## 2013-11-28 DIAGNOSIS — I10 Essential (primary) hypertension: Secondary | ICD-10-CM | POA: Diagnosis not present

## 2013-11-28 DIAGNOSIS — D1779 Benign lipomatous neoplasm of other sites: Secondary | ICD-10-CM | POA: Diagnosis not present

## 2013-11-28 DIAGNOSIS — E785 Hyperlipidemia, unspecified: Secondary | ICD-10-CM | POA: Diagnosis not present

## 2013-11-28 HISTORY — DX: Headache: R51

## 2013-11-28 HISTORY — DX: Type 2 diabetes mellitus without complications: E11.9

## 2013-11-28 LAB — CBC
HCT: 39.1 % (ref 39.0–52.0)
Hemoglobin: 14.4 g/dL (ref 13.0–17.0)
MCH: 33.1 pg (ref 26.0–34.0)
MCHC: 36.8 g/dL — AB (ref 30.0–36.0)
MCV: 89.9 fL (ref 78.0–100.0)
Platelets: 196 10*3/uL (ref 150–400)
RBC: 4.35 MIL/uL (ref 4.22–5.81)
RDW: 12.8 % (ref 11.5–15.5)
WBC: 4.5 10*3/uL (ref 4.0–10.5)

## 2013-11-28 LAB — BASIC METABOLIC PANEL
Anion gap: 13 (ref 5–15)
BUN: 11 mg/dL (ref 6–23)
CALCIUM: 9.4 mg/dL (ref 8.4–10.5)
CO2: 26 meq/L (ref 19–32)
CREATININE: 1.26 mg/dL (ref 0.50–1.35)
Chloride: 99 mEq/L (ref 96–112)
GFR calc Af Amer: 78 mL/min — ABNORMAL LOW (ref >90)
GFR calc non Af Amer: 67 mL/min — ABNORMAL LOW (ref >90)
GLUCOSE: 133 mg/dL — AB (ref 70–99)
Potassium: 4.1 mEq/L (ref 3.7–5.3)
Sodium: 138 mEq/L (ref 137–147)

## 2013-11-28 NOTE — Progress Notes (Signed)
11/28/13 1350  OBSTRUCTIVE SLEEP APNEA  Have you ever been diagnosed with sleep apnea through a sleep study? No  Do you snore loudly (loud enough to be heard through closed doors)?  1  Do you often feel tired, fatigued, or sleepy during the daytime? 0  Has anyone observed you stop breathing during your sleep? 0  Do you have, or are you being treated for high blood pressure? 1  BMI more than 35 kg/m2? 1  Age over 46 years old? 0  Neck circumference greater than 40 cm/16 inches? 1 (17)  Gender: 1  Obstructive Sleep Apnea Score 5  Score 4 or greater  Results sent to PCP

## 2013-11-28 NOTE — Progress Notes (Signed)
11/28/13 1351  OBSTRUCTIVE SLEEP APNEA  Score 4 or greater  Results sent to PCP

## 2013-11-28 NOTE — Pre-Procedure Instructions (Addendum)
Austin Valencia.  11/28/2013   Your procedure is scheduled on:  11/29/13  Report to [facility] cone short stay admitting at 1000 AM.  Call this number if you have problems the morning of surgery: [phone]   Remember:   Do not eat food or drink liquids after midnight.   Take these medicines the morning of surgery with A SIP OF WATER: omeprazole     STOP all herbel meds, nsaids (aleve,naproxen,advil,ibuprofen) now INCLUDING VITAMINS, ASPIRIN  NO DIABETIC MED AM OF SURGERY   Do not wear jewelry, make-up or nail polish.  Do not wear lotions, powders, or perfumes. You may wear deodorant.  Do not shave 48 hours prior to surgery. Men may shave face and neck.  Do not bring valuables to the hospital.  [facility] is not responsible                  for any belongings or valuables.               Contacts, dentures or bridgework may not be worn into surgery.  Leave suitcase in the car. After surgery it may be brought to your room.  For patients admitted to the hospital, discharge time is determined by your                treatment team.               Patients discharged the day of surgery will not be allowed to drive  home.  Name and phone number of your driver:   Special Instructions:  Special Instructions: Austin Valencia - Preparing for Surgery  Before surgery, you can play an important role.  Because skin is not sterile, your skin needs to be as free of germs as possible.  You can reduce the number of germs on you skin by washing with CHG (chlorahexidine gluconate) soap before surgery.  CHG is an antiseptic cleaner which kills germs and bonds with the skin to continue killing germs even after washing.  Please DO NOT use if you have an allergy to CHG or antibacterial soaps.  If your skin becomes reddened/irritated stop using the CHG and inform your nurse when you arrive at Short Stay.  Do not shave (including legs and underarms) for at least 48 hours prior to the first CHG shower.  You may shave  your face.  Please follow these instructions carefully:   1.  Shower with CHG Soap the night before surgery and the morning of Surgery.  2.  If you choose to wash your hair, wash your hair first as usual with your normal shampoo.  3.  After you shampoo, rinse your hair and body thoroughly to remove the Shampoo.  4.  Use CHG as you would any other liquid soap.  You can apply chg directly  to the skin and wash gently with scrungie or a clean washcloth.  5.  Apply the CHG Soap to your body ONLY FROM THE NECK DOWN.  Do not use on open wounds or open sores.  Avoid contact with your eyes ears, mouth and genitals (private parts).  Wash genitals (private parts)       with your normal soap.  6.  Wash thoroughly, paying special attention to the area where your surgery will be performed.  7.  Thoroughly rinse your body with warm water from the neck down.  8.  DO NOT shower/wash with your normal soap after using and rinsing off the CHG Soap.  9.  Pat yourself dry with a clean towel.            10.  Wear clean pajamas.            11.  Place clean sheets on your bed the night of your first shower and do not sleep with pets.  Day of Surgery  Do not apply any lotions/deodorants the morning of surgery.  Please wear clean clothes to the hospital/surgery center.   Please read over the following fact sheets that you were given: Pain Booklet, Coughing and Deep Breathing and Surgical Site Infection Prevention

## 2013-11-29 ENCOUNTER — Encounter (HOSPITAL_BASED_OUTPATIENT_CLINIC_OR_DEPARTMENT_OTHER)
Admission: RE | Disposition: A | Discharge status: Home / Self Care | Payer: Self-pay | Source: Ambulatory Visit | Attending: General Surgery

## 2013-11-29 ENCOUNTER — Encounter (HOSPITAL_BASED_OUTPATIENT_CLINIC_OR_DEPARTMENT_OTHER): Payer: Self-pay | Admitting: Certified Registered"

## 2013-11-29 ENCOUNTER — Ambulatory Visit (HOSPITAL_BASED_OUTPATIENT_CLINIC_OR_DEPARTMENT_OTHER): Payer: BC Managed Care – PPO | Admitting: Certified Registered"

## 2013-11-29 ENCOUNTER — Encounter (HOSPITAL_BASED_OUTPATIENT_CLINIC_OR_DEPARTMENT_OTHER): Payer: BC Managed Care – PPO | Admitting: Certified Registered"

## 2013-11-29 ENCOUNTER — Ambulatory Visit (HOSPITAL_COMMUNITY)
Admission: RE | Admit: 2013-11-29 | Discharge: 2013-11-29 | Disposition: A | Discharge status: Home / Self Care | Payer: BC Managed Care – PPO | Source: Ambulatory Visit | Attending: General Surgery | Admitting: General Surgery

## 2013-11-29 DIAGNOSIS — D1779 Benign lipomatous neoplasm of other sites: Secondary | ICD-10-CM | POA: Diagnosis not present

## 2013-11-29 DIAGNOSIS — D1720 Benign lipomatous neoplasm of skin and subcutaneous tissue of unspecified limb: Secondary | ICD-10-CM

## 2013-11-29 DIAGNOSIS — K219 Gastro-esophageal reflux disease without esophagitis: Secondary | ICD-10-CM | POA: Insufficient documentation

## 2013-11-29 DIAGNOSIS — I10 Essential (primary) hypertension: Secondary | ICD-10-CM | POA: Insufficient documentation

## 2013-11-29 DIAGNOSIS — E785 Hyperlipidemia, unspecified: Secondary | ICD-10-CM | POA: Insufficient documentation

## 2013-11-29 HISTORY — PX: LIPOMA EXCISION: SHX5283

## 2013-11-29 LAB — GLUCOSE, CAPILLARY: GLUCOSE-CAPILLARY: 141 mg/dL — AB (ref 70–99)

## 2013-11-29 SURGERY — EXCISION LIPOMA
Anesthesia: General | Laterality: Right

## 2013-11-29 SURGERY — EXCISION LIPOMA
Anesthesia: General | Site: Axilla | Laterality: Right

## 2013-11-29 MED ORDER — OXYCODONE HCL 5 MG PO TABS
ORAL_TABLET | ORAL | Status: AC
  Filled 2013-11-29: qty 1

## 2013-11-29 MED ORDER — MIDAZOLAM HCL 5 MG/5ML IJ SOLN
INTRAMUSCULAR | Status: DC | PRN
  Administered 2013-11-29: 2 mg via INTRAVENOUS

## 2013-11-29 MED ORDER — CEFAZOLIN SODIUM-DEXTROSE 2-3 GM-% IV SOLR: 2.0000 g | INTRAVENOUS | Status: DC

## 2013-11-29 MED ORDER — OXYCODONE-ACETAMINOPHEN 5-325 MG PO TABS: 1.0000 | ORAL_TABLET | ORAL | Status: DC | PRN

## 2013-11-29 MED ORDER — BUPIVACAINE-EPINEPHRINE (PF) 0.25% -1:200000 IJ SOLN
INTRAMUSCULAR | Status: AC
  Filled 2013-11-29: qty 30

## 2013-11-29 MED ORDER — FENTANYL CITRATE 0.05 MG/ML IJ SOLN
INTRAMUSCULAR | Status: DC | PRN
  Administered 2013-11-29 (×4): 50 ug via INTRAVENOUS

## 2013-11-29 MED ORDER — BUPIVACAINE-EPINEPHRINE 0.25% -1:200000 IJ SOLN
INTRAMUSCULAR | Status: DC | PRN
Start: 2013-11-29 — End: 2013-11-29
  Administered 2013-11-29: 10 mL

## 2013-11-29 MED ORDER — OXYCODONE HCL 5 MG PO TABS
5.0000 mg | ORAL_TABLET | Freq: Once | ORAL | Status: AC | PRN
  Administered 2013-11-29: 5 mg via ORAL

## 2013-11-29 MED ORDER — FENTANYL CITRATE 0.05 MG/ML IJ SOLN
INTRAMUSCULAR | Status: AC
  Filled 2013-11-29: qty 4

## 2013-11-29 MED ORDER — CHLORHEXIDINE GLUCONATE 4 % EX LIQD: 1.0000 "application " | Freq: Once | CUTANEOUS | Status: DC

## 2013-11-29 MED ORDER — LACTATED RINGERS IV SOLN
INTRAVENOUS | Status: DC
  Administered 2013-11-29: 11:00:00 via INTRAVENOUS
  Administered 2013-11-29: 10 mL/h via INTRAVENOUS

## 2013-11-29 MED ORDER — LIDOCAINE HCL (PF) 1 % IJ SOLN
INTRAMUSCULAR | Status: AC
  Filled 2013-11-29: qty 30

## 2013-11-29 MED ORDER — OXYCODONE HCL 5 MG/5ML PO SOLN: 5.0000 mg | Freq: Once | ORAL | Status: AC | PRN

## 2013-11-29 MED ORDER — HYDROMORPHONE HCL PF 1 MG/ML IJ SOLN
0.2500 mg | INTRAMUSCULAR | Status: DC | PRN
  Administered 2013-11-29 (×2): 0.5 mg via INTRAVENOUS

## 2013-11-29 MED ORDER — CEFAZOLIN SODIUM-DEXTROSE 2-3 GM-% IV SOLR
INTRAVENOUS | Status: DC | PRN
  Administered 2013-11-29: 2 g via INTRAVENOUS

## 2013-11-29 MED ORDER — CEFAZOLIN SODIUM-DEXTROSE 2-3 GM-% IV SOLR
INTRAVENOUS | Status: AC
  Filled 2013-11-29: qty 50

## 2013-11-29 MED ORDER — PROPOFOL 10 MG/ML IV BOLUS
INTRAVENOUS | Status: AC
  Filled 2013-11-29: qty 20

## 2013-11-29 MED ORDER — LIDOCAINE HCL (CARDIAC) 20 MG/ML IV SOLN
INTRAVENOUS | Status: DC | PRN
  Administered 2013-11-29: 50 mg via INTRAVENOUS

## 2013-11-29 MED ORDER — ONDANSETRON HCL 4 MG/2ML IJ SOLN: 4.0000 mg | Freq: Once | INTRAMUSCULAR | Status: DC | PRN

## 2013-11-29 MED ORDER — DEXAMETHASONE SODIUM PHOSPHATE 4 MG/ML IJ SOLN
INTRAMUSCULAR | Status: DC | PRN
  Administered 2013-11-29: 10 mg via INTRAVENOUS

## 2013-11-29 MED ORDER — BUPIVACAINE HCL (PF) 0.25 % IJ SOLN
INTRAMUSCULAR | Status: AC
  Filled 2013-11-29: qty 30

## 2013-11-29 MED ORDER — BUPIVACAINE HCL (PF) 0.25 % IJ SOLN
INTRAMUSCULAR | Status: AC
Start: 2013-11-29 — End: 2013-11-29
  Filled 2013-11-29: qty 30

## 2013-11-29 MED ORDER — MIDAZOLAM HCL 2 MG/2ML IJ SOLN
INTRAMUSCULAR | Status: AC
  Filled 2013-11-29: qty 2

## 2013-11-29 MED ORDER — ONDANSETRON HCL 4 MG/2ML IJ SOLN
INTRAMUSCULAR | Status: DC | PRN
  Administered 2013-11-29: 4 mg via INTRAVENOUS

## 2013-11-29 MED ORDER — PROPOFOL 10 MG/ML IV BOLUS
INTRAVENOUS | Status: DC | PRN
  Administered 2013-11-29: 200 mg via INTRAVENOUS

## 2013-11-29 MED ORDER — MEPERIDINE HCL 25 MG/ML IJ SOLN: 6.2500 mg | INTRAMUSCULAR | Status: DC | PRN

## 2013-11-29 MED ORDER — HYDROMORPHONE HCL PF 1 MG/ML IJ SOLN
INTRAMUSCULAR | Status: AC
  Filled 2013-11-29: qty 1

## 2013-11-29 SURGICAL SUPPLY — 47 items
BLADE SURG 10 STRL SS (BLADE) ×3 IMPLANT
BLADE SURG 15 STRL LF DISP TIS (BLADE) ×1 IMPLANT
BLADE SURG 15 STRL SS (BLADE) ×2
CANISTER SUCT 1200ML W/VALVE (MISCELLANEOUS) IMPLANT
CHLORAPREP W/TINT 26ML (MISCELLANEOUS) ×3 IMPLANT
COVER MAYO STAND STRL (DRAPES) ×3 IMPLANT
COVER TABLE BACK 60X90 (DRAPES) ×3 IMPLANT
DECANTER SPIKE VIAL GLASS SM (MISCELLANEOUS) IMPLANT
DERMABOND ADVANCED (GAUZE/BANDAGES/DRESSINGS) ×2
DERMABOND ADVANCED .7 DNX12 (GAUZE/BANDAGES/DRESSINGS) ×1 IMPLANT
DRAPE PED LAPAROTOMY (DRAPES) ×3 IMPLANT
DRAPE UTILITY XL STRL (DRAPES) ×3 IMPLANT
ELECT COATED BLADE 2.86 ST (ELECTRODE) ×3 IMPLANT
ELECT REM PT RETURN 9FT ADLT (ELECTROSURGICAL) ×3
ELECTRODE REM PT RTRN 9FT ADLT (ELECTROSURGICAL) ×1 IMPLANT
GAUZE SPONGE 4X4 16PLY XRAY LF (GAUZE/BANDAGES/DRESSINGS) IMPLANT
GLOVE BIO SURGEON STRL SZ7.5 (GLOVE) ×3 IMPLANT
GLOVE SURG SS PI 7.0 STRL IVOR (GLOVE) ×3 IMPLANT
GOWN STRL REUS W/ TWL LRG LVL3 (GOWN DISPOSABLE) ×2 IMPLANT
GOWN STRL REUS W/TWL LRG LVL3 (GOWN DISPOSABLE) ×4
NEEDLE HYPO 25X1 1.5 SAFETY (NEEDLE) ×3 IMPLANT
NS IRRIG 1000ML POUR BTL (IV SOLUTION) ×3 IMPLANT
PACK BASIN DAY SURGERY FS (CUSTOM PROCEDURE TRAY) ×3 IMPLANT
PENCIL BUTTON HOLSTER BLD 10FT (ELECTRODE) ×3 IMPLANT
SLEEVE SCD COMPRESS KNEE MED (MISCELLANEOUS) ×3 IMPLANT
SPONGE LAP 18X18 X RAY DECT (DISPOSABLE) ×3 IMPLANT
SUT CHROMIC 3 0 SH 27 (SUTURE) IMPLANT
SUT ETHILON 3 0 PS 1 (SUTURE) IMPLANT
SUT MON AB 4-0 PC3 18 (SUTURE) ×6 IMPLANT
SUT PROLENE 3 0 PS 2 (SUTURE) IMPLANT
SUT SILK 2 0 FS (SUTURE) IMPLANT
SUT VIC AB 3-0 54X BRD REEL (SUTURE) IMPLANT
SUT VIC AB 3-0 BRD 54 (SUTURE)
SUT VIC AB 3-0 FS2 27 (SUTURE) IMPLANT
SUT VIC AB 3-0 SH 27 (SUTURE)
SUT VIC AB 3-0 SH 27X BRD (SUTURE) IMPLANT
SUT VIC AB 4-0 RB1 27 (SUTURE)
SUT VIC AB 4-0 RB1 27X BRD (SUTURE) IMPLANT
SUT VICRYL 3-0 CR8 SH (SUTURE) ×3 IMPLANT
SUT VICRYL 4-0 PS2 18IN ABS (SUTURE) IMPLANT
SUT VICRYL AB 3 0 TIES (SUTURE) IMPLANT
SYR CONTROL 10ML LL (SYRINGE) ×3 IMPLANT
TOWEL OR 17X24 6PK STRL BLUE (TOWEL DISPOSABLE) ×6 IMPLANT
TOWEL OR NON WOVEN STRL DISP B (DISPOSABLE) ×3 IMPLANT
TUBE CONNECTING 20'X1/4 (TUBING)
TUBE CONNECTING 20X1/4 (TUBING) IMPLANT
YANKAUER SUCT BULB TIP NO VENT (SUCTIONS) IMPLANT

## 2013-11-29 NOTE — Transfer of Care (Signed)
Immediate Anesthesia Transfer of Care Note  Patient: Austin Valencia.  Procedure(s) Performed: Procedure(s): EXCISION LIPOMA RIGHT AXILLA (Right)  Patient Location: PACU  Anesthesia Type:General  Level of Consciousness: awake, alert  and patient cooperative  Airway & Oxygen Therapy: Patient Spontanous Breathing and Patient connected to face mask oxygen  Post-op Assessment: Report given to PACU RN and Post -op Vital signs reviewed and stable  Post vital signs: Reviewed and stable  Complications: No apparent anesthesia complications

## 2013-11-29 NOTE — Op Note (Signed)
11/29/2013  1:22 PM  PATIENT:  Austin Valencia.  48 y.o. male  PRE-OPERATIVE DIAGNOSIS:  lipoma right axilla  POST-OPERATIVE DIAGNOSIS:  lipoma right axilla, 5cm  PROCEDURE:  Procedure(s): EXCISION LIPOMA RIGHT AXILLA (Right)  SURGEON:  Surgeon(s) and Role:    * Jovita Kussmaul, MD - Primary  PHYSICIAN ASSISTANT:   ASSISTANTS: none   ANESTHESIA:   general  EBL:  Total I/O In: 800 [I.V.:800] Out: -   BLOOD ADMINISTERED:none  DRAINS: none   LOCAL MEDICATIONS USED:  MARCAINE     SPECIMEN:  Source of Specimen:  lipoma right axilla  DISPOSITION OF SPECIMEN:  PATHOLOGY  COUNTS:  YES  TOURNIQUET:  * No tourniquets in log *  DICTATION: .Dragon Dictation After informed consent was obtained the patient was brought to the operating room and placed in the supine position on the operating table. After adequate induction of general anesthesia the patient's right axilla was prepped with ChloraPrep, allowed to dry, and draped in usual sterile manner. There was an obvious lipoma in the right axilla measuring approximately 5 cm. An elliptical incision was made with a 15 blade knife around the base of the lipoma. This incision was carried through the skin and subcutaneous tissue sharply with electrocautery. The entire lipoma was excised sharply with the electrocautery. Once the lipoma was removed it was sent to pathology for further evaluation. Hemostasis was achieved using the Bovie electrocautery. The wound was irrigated with saline and infiltrated with quarter percent Marcaine. The deep layer the wound was then closed with interrupted 3-0 Vicryl stitches. The skin was then closed with interrupted 4-0 Monocryl subcuticular stitches. Dermabond dressings were applied. The patient tolerated the procedure well. At the end of the case all needle sponge and instrument counts were correct. The patient was then awakened and taken to recovery in stable condition.   PLAN OF CARE: Discharge to home  after PACU  PATIENT DISPOSITION:  PACU - hemodynamically stable.   Delay start of Pharmacological VTE agent (>24hrs) due to surgical blood loss or risk of bleeding: not applicable

## 2013-11-29 NOTE — Anesthesia Postprocedure Evaluation (Signed)
Anesthesia Post Note  Patient: Austin Valencia.  Procedure(s) Performed: Procedure(s) (LRB): EXCISION LIPOMA RIGHT AXILLA (Right)  Anesthesia type: General  Patient location: PACU  Post pain: Pain level controlled and Adequate analgesia  Post assessment: Post-op Vital signs reviewed, Patient's Cardiovascular Status Stable, Respiratory Function Stable, Patent Airway and Pain level controlled  Last Vitals:  Filed Vitals:   11/29/13 1415  BP: 136/82  Pulse: 61  Temp:   Resp: 15    Post vital signs: Reviewed and stable  Level of consciousness: awake, alert  and oriented  Complications: No apparent anesthesia complications

## 2013-11-29 NOTE — Anesthesia Procedure Notes (Signed)
Procedure Name: LMA Insertion Date/Time: 11/29/2013 12:35 PM Performed by: Toula Moos L Pre-anesthesia Checklist: Patient identified, Emergency Drugs available, Suction available, Patient being monitored and Timeout performed Patient Re-evaluated:Patient Re-evaluated prior to inductionOxygen Delivery Method: Circle System Utilized Preoxygenation: Pre-oxygenation with 100% oxygen Intubation Type: IV induction Ventilation: Mask ventilation without difficulty LMA: LMA inserted LMA Size: 4.0 Number of attempts: 1 Airway Equipment and Method: bite block Placement Confirmation: positive ETCO2 and breath sounds checked- equal and bilateral Tube secured with: Tape Dental Injury: Teeth and Oropharynx as per pre-operative assessment

## 2013-11-29 NOTE — Discharge Instructions (Signed)

## 2013-11-29 NOTE — Anesthesia Preprocedure Evaluation (Signed)
Anesthesia Evaluation  Patient identified by MRN, date of birth, ID band Patient awake    Reviewed: Allergy & Precautions, H&P , NPO status , Patient's Chart, lab work & pertinent test results  Airway Mallampati: I TM Distance: >3 FB Neck ROM: Full    Dental   Pulmonary          Cardiovascular hypertension, Pt. on medications     Neuro/Psych    GI/Hepatic GERD-  Medicated and Controlled,  Endo/Other  diabetes, Type 2, Oral Hypoglycemic Agents  Renal/GU      Musculoskeletal   Abdominal   Peds  Hematology   Anesthesia Other Findings   Reproductive/Obstetrics                           Anesthesia Physical Anesthesia Plan  ASA: II  Anesthesia Plan: General   Post-op Pain Management:    Induction: Intravenous  Airway Management Planned: Oral ETT  Additional Equipment:   Intra-op Plan:   Post-operative Plan: Extubation in OR  Informed Consent: I have reviewed the patients History and Physical, chart, labs and discussed the procedure including the risks, benefits and alternatives for the proposed anesthesia with the patient or authorized representative who has indicated his/her understanding and acceptance.     Plan Discussed with: CRNA and Surgeon  Anesthesia Plan Comments:         Anesthesia Quick Evaluation

## 2013-11-29 NOTE — H&P (Signed)
Austin Valencia.  05/23/2013 9:10 AM   Office Visit  MRN:  [id number]   Description: 48 year old male  Provider: Autumn Messing III, MD  Department: [facility]         Diagnoses      Lipoma of axilla    -  Primary      ICD-9-CM: 214.1 ICD-10-CM: D17.20             Reason for Visit      Other      Eval Lipoma rt axilla               Current Vitals Most recent update: 05/23/2013  9:28 AM by Illene Regulus, MA      BP Pulse Resp Ht Wt BMI      148/84 78 16 5\' 7"  (1.702 m) 236 lb (107.049 kg) 36.95 kg/m2             Progress Notes      Autumn Messing III, MD at 05/23/2013  9:45 AM      Status: Signed            Patient ID: Austin Roche., male   DOB: 27-Jul-1965, 48 y.o.   MRN: [id number]    Chief Complaint   Patient presents with   .  Other       Eval Lipoma rt axilla        HPI Austin Vanduyne. is a 48 y.o. male.  We're asked to see the patient in consultation by Dr. Terrence Dupont to evaluate him for a lipoma of his right axilla. The patient is a 48 year old black male who says that he has had this mass in his right axilla for the last couple of years. He does not have any acute pain but it is in an unusual position and he does notice it. He denies any fevers or chills. He has no weakness of the right arm. It does not seem to change the whole lot of the last couple years  HPI    Past Medical History   Diagnosis  Date   .  Hypertension     .  GERD (gastroesophageal reflux disease)     .  Hyperlipidemia          History reviewed. No pertinent past surgical history.    Family History   Problem  Relation  Age of Onset   .  Cancer  Father         lung        Social History History   Substance Use Topics   .  Smoking status:  Never Smoker    .  Smokeless tobacco:  Never Used   .  Alcohol Use:  No        No Known Allergies    Current Outpatient Prescriptions   Medication  Sig  Dispense  Refill   .  doxazosin (CARDURA) 8 MG tablet  Take 0.5 tablets  (4 mg total) by mouth at bedtime.   30 tablet   0   .  losartan (COZAAR) 50 MG tablet  Take 50 mg by mouth daily.         .  ranitidine (ZANTAC) 150 MG tablet  Take 1 tablet (150 mg total) by mouth 2 (two) times daily.   60 tablet   12       No current facility-administered medications for this visit.        Review  of Systems Review of Systems  Constitutional: Negative.   HENT: Negative.   Eyes: Negative.   Respiratory: Negative.   Cardiovascular: Negative.   Gastrointestinal: Negative.   Endocrine: Negative.   Genitourinary: Negative.   Musculoskeletal: Negative.   Skin: Negative.   Allergic/Immunologic: Negative.   Neurological: Negative.   Hematological: Negative.   Psychiatric/Behavioral: Negative.       Blood pressure 148/84, pulse 78, resp. rate 16, height 5\' 7"  (1.702 m), weight 236 lb (107.049 kg).   Physical Exam Physical Exam  Constitutional: He is oriented to person, place, and time. He appears well-developed and well-nourished.  HENT:   Head: Normocephalic and atraumatic.  Eyes: Conjunctivae and EOM are normal. Pupils are equal, round, and reactive to light.  Neck: Normal range of motion. Neck supple.  Cardiovascular: Normal rate, regular rhythm and normal heart sounds.   Pulmonary/Chest: Effort normal and breath sounds normal.  Abdominal: Soft. Bowel sounds are normal.  Musculoskeletal: Normal range of motion.  There is a 4 cm fatty lipoma centrally in the right axilla.  Neurological: He is alert and oriented to person, place, and time.  Skin: Skin is warm and dry.  Psychiatric: He has a normal mood and affect. His behavior is normal.      Data Reviewed As above   Assessment    The patient has a moderate sized lipoma in the right axilla. Because of its size and the fact that it does cause him some occasional discomfort a figure be reasonable to remove it. I have discussed the operation to remove the lipoma with him including the risks and  benefits of surgery as well as some of the technical aspects and he understands and would like to think about it before scheduling      Plan    The patient will call back when he is ready to schedule excision of the lipoma from his right axilla

## 2013-11-30 ENCOUNTER — Encounter (INDEPENDENT_AMBULATORY_CARE_PROVIDER_SITE_OTHER): Payer: Self-pay

## 2013-11-30 ENCOUNTER — Encounter (HOSPITAL_BASED_OUTPATIENT_CLINIC_OR_DEPARTMENT_OTHER): Payer: Self-pay | Admitting: General Surgery

## 2013-12-09 ENCOUNTER — Telehealth (INDEPENDENT_AMBULATORY_CARE_PROVIDER_SITE_OTHER): Payer: Self-pay | Admitting: Surgery

## 2013-12-09 NOTE — Telephone Encounter (Signed)
Mr. Austin Valencia had a lipoma excised by Dr. Marlou Starks on 11/29/2013.  He has called me twice today about swelling in the wound and pain.  He was on a cell phone that was breaking up, so some communication was not good.  He is thinking about going to the ER because of the swelling.  I told him that mild swelling would be normal, but a lot of swelling wound not.  I encouraged him to go to the ER if he had a question.  Alphonsa Overall, MD, [facility] Surgery Pager: [phone] Office phone:  [phone]

## 2013-12-11 ENCOUNTER — Encounter (INDEPENDENT_AMBULATORY_CARE_PROVIDER_SITE_OTHER): Payer: Self-pay

## 2014-03-26 ENCOUNTER — Ambulatory Visit: Payer: BC Managed Care – PPO | Admitting: Family

## 2014-03-28 ENCOUNTER — Other Ambulatory Visit (INDEPENDENT_AMBULATORY_CARE_PROVIDER_SITE_OTHER): Payer: BC Managed Care – PPO

## 2014-03-28 ENCOUNTER — Encounter: Payer: Self-pay | Admitting: Family

## 2014-03-28 ENCOUNTER — Telehealth: Payer: Self-pay | Admitting: Family

## 2014-03-28 ENCOUNTER — Ambulatory Visit (INDEPENDENT_AMBULATORY_CARE_PROVIDER_SITE_OTHER): Payer: BC Managed Care – PPO | Admitting: Family

## 2014-03-28 VITALS — BP 130/88 | HR 69 | Temp 98.2°F | Resp 18 | Ht 68.0 in | Wt 239.4 lb

## 2014-03-28 DIAGNOSIS — K219 Gastro-esophageal reflux disease without esophagitis: Secondary | ICD-10-CM

## 2014-03-28 DIAGNOSIS — I10 Essential (primary) hypertension: Secondary | ICD-10-CM

## 2014-03-28 DIAGNOSIS — E118 Type 2 diabetes mellitus with unspecified complications: Secondary | ICD-10-CM

## 2014-03-28 LAB — MICROALBUMIN / CREATININE URINE RATIO
Creatinine,U: 261.7 mg/dL
MICROALB UR: 2.8 mg/dL — AB (ref 0.0–1.9)
Microalb Creat Ratio: 1.1 mg/g (ref 0.0–30.0)

## 2014-03-28 LAB — BASIC METABOLIC PANEL
BUN: 12 mg/dL (ref 6–23)
CO2: 28 meq/L (ref 19–32)
Calcium: 9.1 mg/dL (ref 8.4–10.5)
Chloride: 104 mEq/L (ref 96–112)
Creatinine, Ser: 1.7 mg/dL — ABNORMAL HIGH (ref 0.4–1.5)
GFR: 57.57 mL/min — AB (ref >60.00)
Glucose, Bld: 159 mg/dL — ABNORMAL HIGH (ref 70–99)
POTASSIUM: 4.1 meq/L (ref 3.5–5.1)
SODIUM: 138 meq/L (ref 135–145)

## 2014-03-28 LAB — HEMOGLOBIN A1C: HEMOGLOBIN A1C: 7 % — AB (ref 4.6–6.5)

## 2014-03-28 NOTE — Progress Notes (Signed)
Pre visit review using our clinic review tool, if applicable. No additional management support is needed unless otherwise documented below in the visit note. 

## 2014-03-28 NOTE — Patient Instructions (Signed)
Thank you for choosing [facility].  Summary/Instructions:  Please continue to take your medications as prescribed.   Your prescription(s) have been submitted to your pharmacy or been printed and provided for you. Please take as directed and contact our office if you believe you are having problem(s) with the medication(s) or have any questions.  Please stop by the lab on the basement level of the building for your blood work. Your results will be released to [place] (or called to you) after review, usually within 72 hours after test completion. If any changes need to be made, you will be notified at that same time.

## 2014-03-28 NOTE — Assessment & Plan Note (Signed)
Diabetes currently remains stable with Amaryl. Obtain A1c, urine microalbumin, and basic metabolic panel. Diabetic foot exam complete. There is some decrease in sensation noted to monofilament. Patient is due for diabetic eye exam. Refer to ophthalmology for eye exam. Continue current Amaryl pending lab results.

## 2014-03-28 NOTE — Telephone Encounter (Signed)
Please call the patient and inform him that his new hemoglobin A1c is 7.0 which has increased and is at goal. In addition there was some protein in his urine indicating some damage to his kidneys. Right now the best thing that we can do is to work to control his blood glucose and blood pressure to protect his kidneys. We will recheck his A1c in about 3 months to determine progress. At that time it may be necessary to increase his current medication or add an additional medication. Please let us know if he has any questions.

## 2014-03-28 NOTE — Assessment & Plan Note (Addendum)
Blood pressure remained stable at goal with current regimen. Continue doxazosin and losartan at current dosage. Eye exam will be completed with diabetic eye exam. Obtain basic metabolic panel to check kidney function.

## 2014-03-28 NOTE — Progress Notes (Signed)
   Subjective:    Patient ID: Austin Roche., male    DOB: 12-16-1965, 49 y.o.   MRN: [id number]  Chief Complaint  Patient presents with  . Establish Care    no concerns    HPI:  Austin Prater. is a 49 y.o. male who presents today to establish care and discuss his hypertension, GERD and diabetes.   1) Diabetes - currently treated with Amaryl. Last A1c was in March, which was when he was first diagnosed with diabetes. Due for an eye exam. Due for foot exam.   Lab Results  Component Value Date   HGBA1C 5.6 12/26/2009   Lab Results  Component Value Date   CREATININE 1.26 11/28/2013    2) Hypertension - currently treated with losartan and doxazosin.  BP Readings from Last 3 Encounters:  03/28/14 130/88  11/29/13 136/84  11/28/13 131/78    3) GERD - currently treated with omeprazole.   No Known Allergies   Current Outpatient Prescriptions on File Prior to Visit  Medication Sig Dispense Refill  . doxazosin (CARDURA) 8 MG tablet Take 0.5 tablets (4 mg total) by mouth at bedtime. 30 tablet 0  . glimepiride (AMARYL) 4 MG tablet Take 4 mg by mouth daily with breakfast.    . losartan (COZAAR) 50 MG tablet Take 50 mg by mouth daily.    Marland Kitchen omeprazole (PRILOSEC) 20 MG capsule Take 20 mg by mouth daily.     No current facility-administered medications on file prior to visit.    Past Medical History  Diagnosis Date  . Hypertension   . GERD (gastroesophageal reflux disease)   . Hyperlipidemia   . Diabetes mellitus without complication   . Headache(784.0)     Review of Systems  Eyes:       Indicates some changes in vision recently.  Respiratory: Negative for chest tightness and shortness of breath.   Cardiovascular: Negative for chest pain, palpitations and leg swelling.  Endocrine: Negative for polydipsia, polyphagia and polyuria.      Objective:    BP 130/88 mmHg  Pulse 69  Temp(Src) 98.2 F (36.8 C) (Oral)  Resp 18  Ht 5\' 8"  (1.727 m)  Wt 239 lb 6.4 oz  (108.591 kg)  BMI 36.41 kg/m2  SpO2 93% Nursing note and vital signs reviewed.  Physical Exam  Constitutional: He is oriented to person, place, and time. He appears well-developed and well-nourished. No distress.  Cardiovascular: Normal rate, regular rhythm, normal heart sounds and intact distal pulses.   Pulmonary/Chest: Effort normal and breath sounds normal.  Musculoskeletal:  Diabetic foot exam-bilateral feet are free from skin breakdown. Pulses are intact and appropriate bilaterally. Slight decrease in sensation noted bilaterally with monofilament.  Neurological: He is alert and oriented to person, place, and time.  Skin: Skin is warm and dry.  Psychiatric: He has a normal mood and affect. His behavior is normal. Judgment and thought content normal.       Assessment & Plan:

## 2014-03-28 NOTE — Assessment & Plan Note (Addendum)
Stable with current regimen. Continue current dosage.

## 2014-03-29 NOTE — Telephone Encounter (Signed)
Spoke with pt to discuss his blood work. He understood the results. I transferred to scheduling so he could go ahead and make a 3 mo follow up

## 2014-03-29 NOTE — Telephone Encounter (Signed)
Called pt and mother answered. She said she would get pt to call back for lab results.

## 2014-04-24 ENCOUNTER — Telehealth: Payer: Self-pay | Admitting: Family

## 2014-04-24 NOTE — Telephone Encounter (Signed)
Pt request refill for glimepiride (AMARYL) 4 MG tablet. Please send to [facility]

## 2014-04-25 LAB — HM DIABETES EYE EXAM

## 2014-04-25 MED ORDER — GLIMEPIRIDE 4 MG PO TABS: 4.0000 mg | ORAL_TABLET | Freq: Every day | ORAL | Status: DC

## 2014-04-25 NOTE — Telephone Encounter (Signed)
Medication sent to pharmacy  

## 2014-05-07 ENCOUNTER — Encounter: Payer: Self-pay | Admitting: Family

## 2014-05-22 ENCOUNTER — Encounter (HOSPITAL_COMMUNITY): Payer: Self-pay | Admitting: *Deleted

## 2014-05-22 ENCOUNTER — Emergency Department (HOSPITAL_COMMUNITY)
Admission: EM | Admit: 2014-05-22 | Discharge: 2014-05-22 | Disposition: A | Discharge status: Home / Self Care | Payer: BC Managed Care – PPO | Attending: Emergency Medicine | Admitting: Emergency Medicine

## 2014-05-22 DIAGNOSIS — E119 Type 2 diabetes mellitus without complications: Secondary | ICD-10-CM | POA: Insufficient documentation

## 2014-05-22 DIAGNOSIS — K088 Other specified disorders of teeth and supporting structures: Secondary | ICD-10-CM | POA: Insufficient documentation

## 2014-05-22 DIAGNOSIS — Z8639 Personal history of other endocrine, nutritional and metabolic disease: Secondary | ICD-10-CM | POA: Insufficient documentation

## 2014-05-22 DIAGNOSIS — I10 Essential (primary) hypertension: Secondary | ICD-10-CM | POA: Diagnosis not present

## 2014-05-22 DIAGNOSIS — Z8719 Personal history of other diseases of the digestive system: Secondary | ICD-10-CM | POA: Diagnosis not present

## 2014-05-22 DIAGNOSIS — K0889 Other specified disorders of teeth and supporting structures: Secondary | ICD-10-CM

## 2014-05-22 MED ORDER — PENICILLIN V POTASSIUM 500 MG PO TABS: 500.0000 mg | ORAL_TABLET | Freq: Four times a day (QID) | ORAL | Status: DC

## 2014-05-22 MED ORDER — OXYCODONE-ACETAMINOPHEN 5-325 MG PO TABS: 1.0000 | ORAL_TABLET | ORAL | Status: DC | PRN

## 2014-05-22 NOTE — Discharge Instructions (Signed)
Dental Pain A tooth ache may be caused by cavities (tooth decay). Cavities expose the nerve of the tooth to air and hot or cold temperatures. It may come from an infection or abscess (also called a boil or furuncle) around your tooth. It is also often caused by dental caries (tooth decay). This causes the pain you are having. DIAGNOSIS  Your caregiver can diagnose this problem by exam. TREATMENT   If caused by an infection, it may be treated with medications which kill germs (antibiotics) and pain medications as prescribed by your caregiver. Take medications as directed.  Only take over-the-counter or prescription medicines for pain, discomfort, or fever as directed by your caregiver.  Whether the tooth ache today is caused by infection or dental disease, you should see your dentist as soon as possible for further care. SEEK MEDICAL CARE IF: The exam and treatment you received today has been provided on an emergency basis only. This is not a substitute for complete medical or dental care. If your problem worsens or new problems (symptoms) appear, and you are unable to meet with your dentist, call or return to this location. SEEK IMMEDIATE MEDICAL CARE IF:   You have a fever.  You develop redness and swelling of your face, jaw, or neck.  You are unable to open your mouth.  You have severe pain uncontrolled by pain medicine. MAKE SURE YOU:   Understand these instructions.  Will watch your condition.  Will get help right away if you are not doing well or get worse. Document Released: 03/09/2005 Document Revised: 06/01/2011 Document Reviewed: 10/26/2007 [facility] Patient Information 2015 [place], [place]. This information is not intended to replace advice given to you by your health care provider. Make sure you discuss any questions you have with your health care provider. Dental Abscess A dental abscess is a collection of infected fluid (pus) from a bacterial infection in the inner part of  the tooth (pulp). It usually occurs at the end of the tooth's root.  CAUSES   Severe tooth decay.  Trauma to the tooth that allows bacteria to enter into the pulp, such as a broken or chipped tooth. SYMPTOMS   Severe pain in and around the infected tooth.  Swelling and redness around the abscessed tooth or in the mouth or face.  Tenderness.  Pus drainage.  Bad breath.  Bitter taste in the mouth.  Difficulty swallowing.  Difficulty opening the mouth.  Nausea.  Vomiting.  Chills.  Swollen neck glands. DIAGNOSIS   A medical and dental history will be taken.  An examination will be performed by tapping on the abscessed tooth.  X-rays may be taken of the tooth to identify the abscess. TREATMENT The goal of treatment is to eliminate the infection. You may be prescribed antibiotic medicine to stop the infection from spreading. A root canal may be performed to save the tooth. If the tooth cannot be saved, it may be pulled (extracted) and the abscess may be drained.  HOME CARE INSTRUCTIONS  Only take over-the-counter or prescription medicines for pain, fever, or discomfort as directed by your caregiver.  Rinse your mouth (gargle) often with salt water ( tsp salt in 8 oz [250 ml] of warm water) to relieve pain or swelling.  Do not drive after taking pain medicine (narcotics).  Do not apply heat to the outside of your face.  Return to your dentist for further treatment as directed. SEEK MEDICAL CARE IF:  Your pain is not helped by medicine.  Your pain is getting worse instead of better. SEEK IMMEDIATE MEDICAL CARE IF:  You have a fever or persistent symptoms for more than 2-3 days.  You have a fever and your symptoms suddenly get worse.  You have chills or a very bad headache.  You have problems breathing or swallowing.  You have trouble opening your mouth.  You have swelling in the neck or around the eye. Document Released: 03/09/2005 Document Revised:  12/02/2011 Document Reviewed: 06/17/2010 [facility] Patient Information 2015 [place], [place]. This information is not intended to replace advice given to you by your health care provider. Make sure you discuss any questions you have with your health care provider.

## 2014-05-22 NOTE — ED Notes (Signed)
Declined W/C at D/C and was escorted to lobby by RN. 

## 2014-05-22 NOTE — ED Provider Notes (Signed)
History of Present Illness   Patient Identification Austin Valencia. is a 49 y.o. male.  Patient information was obtained from patient and past medical records. History/Exam limitations: none. Patient presented to the Emergency Department by private vehicle.  Chief Complaint  Dental Pain   Patient who presents with complaint of toothache. Onset of symptoms was abrupt starting 8 hours ago. Patient describes pain as aching. Pain severity at onset was moderate. The pain does not radiate. Patient admits to jaw swelling but denies fever, chills, myalgias, sublingual swelling, diofficulty breathing or swallowing. Pain is aggravated by movement and palpation. Pain is alleviated by norco which patient received form a friend. The patient denies other complaints. Patient has not sought treatment by another care provider for this problem. Care prior to arrival consisted of ice, heat and narcotic, with moderate relief.   Past Medical History  Diagnosis Date  . Hypertension   . GERD (gastroesophageal reflux disease)   . Hyperlipidemia   . Diabetes mellitus without complication   . Headache(784.0)    Family History  Problem Relation Age of Onset  . Cancer Father     lung  . Diabetes Father    Scheduled Meds: Continuous Infusions: PRN Meds:  No Known Allergies History   Social History  . Marital Status: Single    Spouse Name: N/A  . Number of Children: 2  . Years of Education: 12   Occupational History  . Custodian    Social History Main Topics  . Smoking status: Never Smoker   . Smokeless tobacco: Never Used  . Alcohol Use: No  . Drug Use: No  . Sexual Activity: Yes   Other Topics Concern  . Not on file   Social History Narrative   Born in [place], [place] and raised [place], [place]. Currently resides in a house with his mother. No pets. Fun: Go to sporting events, concerts   Denies religious beliefs that would effect health care.    Review of Systems Constitutional:  negative Ears, nose, mouth, throat, and face: positive for dentalgia and facial swelling Cardiovascular: negative Gastrointestinal: negative Musculoskeletal:negative, for myalgias, arthralgias.   Physical Exam   There were no vitals taken for this visit. Physical Exam  Nursing note and vitals reviewed. Constitutional: He appears well-developed and well-nourished. No distress.  HENT:  Head: Normocephalic and atraumatic.  Eyes: Conjunctivae normal are normal. No scleral icterus.  Neck: Normal range of motion. Neck supple.  Mouth: There is mild gingival swelling in the periapical region above the front left central incisor. There is a developing periapical abscess that is forming a small head. TTP. Not large enough to drain. No sublingual swelling, oropharynx clear and moist. Cardiovascular: Normal rate, regular rhythm and normal heart sounds.   Pulmonary/Chest: Effort normal and breath sounds normal. No respiratory distress.  Abdominal: Soft. There is no tenderness.  Musculoskeletal: He exhibits no edema.  Neurological: He is alert.  Skin: Skin is warm and dry. He is not diaphoretic.  Psychiatric: His behavior is normal.     ED Course   Studies: None indicated.  Records Reviewed: Old medical records.  Treatments: Penicillin prescribed. Pain meds  Consultations: none  Disposition: Home Narcotic pain medication and penicillin Condition stable F/u with dentist  Margarita Mail, PA-C 05/22/14 [place], MD 05/22/14 [phone]

## 2014-05-22 NOTE — ED Notes (Addendum)
Pt reports dental pain started last night. Pt took a hydrocodone pill  1HR  To arrival to ED today

## 2014-05-23 ENCOUNTER — Ambulatory Visit (INDEPENDENT_AMBULATORY_CARE_PROVIDER_SITE_OTHER): Payer: BC Managed Care – PPO | Admitting: Internal Medicine

## 2014-05-23 ENCOUNTER — Encounter: Payer: Self-pay | Admitting: Internal Medicine

## 2014-05-23 VITALS — BP 118/78 | HR 71 | Temp 98.2°F | Resp 16 | Ht 68.0 in | Wt 238.0 lb

## 2014-05-23 DIAGNOSIS — T783XXD Angioneurotic edema, subsequent encounter: Secondary | ICD-10-CM

## 2014-05-23 DIAGNOSIS — K047 Periapical abscess without sinus: Secondary | ICD-10-CM

## 2014-05-23 MED ORDER — PREDNISONE 10 MG PO TABS: 10.0000 mg | ORAL_TABLET | Freq: Every day | ORAL | Status: DC

## 2014-05-23 NOTE — Progress Notes (Signed)
   Subjective:    Patient ID: Austin Valencia., male    DOB: February 27, 1966, 49 y.o.   MRN: [id number]  HPI   He began to have low-grade fever 05/19/14. As of 2/29 he noticed swelling and pain of the area around the left anterior tooth. This was associated with swelling of the lips as well as the left face.  He was seen in the emergency room yesterday and prescribed a narcotic pain pill and penicillin for probable abscessed tooth. Despite this the swelling of the lips and left face has progressed.  He saw a Dentist after the ER visit who took confirmatory Xrays.  He has slight wheezing. Also he describes some discomfort or pain in the left maxillary sinus area.  He does not monitor his blood pressure at home. He is on losartan.He states he last took this 2/29.  He  irregularly checks his glucose; it was 163 this morning.It was 7% on 03/28/14  Review of Systems  He denies frontal sinus pain, itchy/ watery eyes, dyspnea, or associated facial rash.  He had had some loose to watery stool previously but this has resolved.    Objective:   Physical Exam  Pertinent or positive findings: Head is shaven.  There is marked edema of the upper lip;it is 2 X size of lower lip. The tongue and oropharynx are not swollen.No UAO clinically. The left anterior tooth is light brown.  Nares are erythematous with polyps bilaterally.  General appearance:Adequately nourished; no acute distress or increased work of breathing is present.  No  lymphadenopathy about the head, neck, or axilla noted.  Eyes: No conjunctival inflammation or lid edema is present. There is no scleral icterus. Ears:  External ear exam shows no significant lesions or deformities.  Otoscopic examination reveals clear canals, tympanic membranes are intact bilaterally without bulging, retraction, inflammation or discharge. Nose:  External nasal examination shows no deformity or inflammation.  Oral exam: Otherwise lips and gums are healthy  appearing.There is no oropharyngeal erythema or exudate noted.  Neck:  No deformities, thyromegaly, masses, or tenderness noted.   Supple with full range of motion without pain.  Heart:  Normal rate and regular rhythm. S1 and S2 normal without gallop, murmur, click, rub or other extra sounds Lungs:Chest clear to auscultation; no wheezes, rhonchi,rales ,or rubs present. Extremities:  No cyanosis, edema, or clubbing  noted  Skin: Warm & dry w/o tenting.      Assessment & Plan:  #1 abscessed tooth  #2 lip and facial swelling most likely related to losartan, angiotensin receptor blocker.  Plan: He will continue the antibiotic and pain medicine. He'll stop the losartan and monitor his blood pressure. If it remains elevated an antihypertensive agent which is not in the ACE inhibitor or ARB class  could be started.

## 2014-05-23 NOTE — Patient Instructions (Addendum)
Swelling of the tongue is MOST likely from the blood pressure medicine. Because of this the class of blood pressure medications called angiotensin receptor blockers  AS WELL AS angiotensin-converting enzyme inhibitors cannot be used. Please consider wearing a Medihaler button bracelet stating this. Monitor BP OFF the Losartan.  Minimal Blood Pressure Goal= AVERAGE < 140/90;  Ideal is an AVERAGE < 135/85. This AVERAGE should be calculated from @ least 5-7 BP readings taken @ different times of day on different days of week. You should not respond to isolated BP readings , but rather the AVERAGE for that week .Please bring your  blood pressure cuff to office visits to verify that it is reliable.It  can also be checked against the blood pressure device at the pharmacy. Finger or wrist cuffs are not dependable; an arm cuff is.   Plain Mucinex (NOT D) for thick secretions ;force NON dairy fluids .   Nasal cleansing in the shower as discussed with lather of mild shampoo.After 10 seconds wash off lather while  exhaling through nostrils. Make sure that all residual soap is removed to prevent irritation.  Flonase OR Nasacort AQ 1 spray in each nostril twice a day as needed. Use the "crossover" technique into opposite nostril spraying toward opposite ear @ 45 degree angle, not straight up into nostril.  Plain Allegra (NOT D )  160 daily , Loratidine 10 mg , OR Zyrtec 10 mg @ bedtime  as needed for itchy eyes & sneezing.

## 2014-06-06 ENCOUNTER — Telehealth: Payer: Self-pay | Admitting: Family

## 2014-06-06 ENCOUNTER — Ambulatory Visit: Payer: BC Managed Care – PPO | Admitting: Family

## 2014-06-06 NOTE — Telephone Encounter (Signed)
Patient no showed for follow up today.  Please advise.

## 2014-06-07 NOTE — Telephone Encounter (Signed)
noted 

## 2014-06-07 NOTE — Telephone Encounter (Signed)
Ok to reschedule if he calls.

## 2014-06-28 ENCOUNTER — Encounter: Payer: Self-pay | Admitting: Family

## 2014-06-28 ENCOUNTER — Other Ambulatory Visit (INDEPENDENT_AMBULATORY_CARE_PROVIDER_SITE_OTHER): Payer: BC Managed Care – PPO

## 2014-06-28 ENCOUNTER — Telehealth: Payer: Self-pay | Admitting: Family

## 2014-06-28 ENCOUNTER — Ambulatory Visit (INDEPENDENT_AMBULATORY_CARE_PROVIDER_SITE_OTHER): Payer: BC Managed Care – PPO | Admitting: Family

## 2014-06-28 VITALS — BP 120/82 | HR 82 | Temp 98.2°F | Resp 18 | Ht 68.0 in | Wt 227.0 lb

## 2014-06-28 DIAGNOSIS — I10 Essential (primary) hypertension: Secondary | ICD-10-CM

## 2014-06-28 DIAGNOSIS — E118 Type 2 diabetes mellitus with unspecified complications: Secondary | ICD-10-CM

## 2014-06-28 LAB — BASIC METABOLIC PANEL
BUN: 10 mg/dL (ref 6–23)
CALCIUM: 10.1 mg/dL (ref 8.4–10.5)
CO2: 29 mEq/L (ref 19–32)
Chloride: 101 mEq/L (ref 96–112)
Creatinine, Ser: 1.5 mg/dL (ref 0.40–1.50)
GFR: 64.64 mL/min (ref >60.00)
Glucose, Bld: 204 mg/dL — ABNORMAL HIGH (ref 70–99)
POTASSIUM: 4.1 meq/L (ref 3.5–5.1)
Sodium: 138 mEq/L (ref 135–145)

## 2014-06-28 LAB — HEMOGLOBIN A1C: Hgb A1c MFr Bld: 7.4 % — ABNORMAL HIGH (ref 4.6–6.5)

## 2014-06-28 NOTE — Assessment & Plan Note (Signed)
Hypertension well controlled with current regimen. Continue amlodipine at current dosage. Follow up in 6 months or sooner if needed.

## 2014-06-28 NOTE — Progress Notes (Signed)
   Subjective:    Patient ID: Austin Valencia., male    DOB: 1965-04-11, 49 y.o.   MRN: [id number]   Chief Complaint  Patient presents with  . Follow-up    Hypertension, Diabetes   HPI:  Austin Valencia. is a 49 y.o. male who presents today for follow up.   1) Diabetes - Indicates that his diabetes appears to be under control. Learning to control what he eats. Denies any hypoglycemic events.   Lab Results  Component Value Date   HGBA1C 7.0* 03/28/2014    2) Blood pressure - Was previously seen in the office for a dental abscess and found to possibly have some angioedema. He was taken of the losartan and started on the amlodipine. Denies any lower extremity edema.   BP Readings from Last 3 Encounters:  06/28/14 120/82  05/23/14 118/78  05/22/14 135/87    Allergies  Allergen Reactions  . 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 Ace Inhibitors     See 05/23/14 angioedema with Losartan    Current Outpatient Prescriptions on File Prior to Visit  Medication Sig Dispense Refill  . doxazosin (CARDURA) 8 MG tablet Take 0.5 tablets (4 mg total) by mouth at bedtime. 30 tablet 0  . glimepiride (AMARYL) 4 MG tablet Take 1 tablet (4 mg total) by mouth daily with breakfast. 30 tablet 3   No current facility-administered medications on file prior to visit.    Review of Systems  Eyes:       Denies changes in vision.   Respiratory: Negative for chest tightness and shortness of breath.   Cardiovascular: Negative for chest pain, palpitations and leg swelling.  Neurological: Negative for headaches.       Objective:    BP 120/82 mmHg  Pulse 82  Temp(Src) 98.2 F (36.8 C) (Oral)  Resp 18  Ht 5\' 8"  (1.727 m)  Wt 227 lb (102.967 kg)  BMI 34.52 kg/m2  SpO2 96% Nursing note and vital signs reviewed.  Physical Exam  Constitutional: He is oriented to person, place, and time. He appears well-developed and well-nourished. No distress.  Cardiovascular: Normal rate, regular  rhythm, normal heart sounds and intact distal pulses.   Pulmonary/Chest: Effort normal and breath sounds normal.  Neurological: He is alert and oriented to person, place, and time.  Skin: Skin is warm and dry.  Psychiatric: He has a normal mood and affect. His behavior is normal. Judgment and thought content normal.       Assessment & Plan:

## 2014-06-28 NOTE — Telephone Encounter (Signed)
Please inform patient that his A1c increased to 7.4. Therefore please have him continue to take the glipizide as is currently prescribed. We will plan to follow up in about 4 months to check his next A1c. His kidney function has improved some which is good.

## 2014-06-28 NOTE — Patient Instructions (Signed)
Thank you for choosing [facility].  Summary/Instructions:  Your prescription(s) have been submitted to your pharmacy or been printed and provided for you. Please take as directed and contact our office if you believe you are having problem(s) with the medication(s) or have any questions.  Please stop by the lab on the basement level of the building for your blood work. Your results will be released to MyChart (or called to you) after review, usually within 72 hours after test completion. If any changes need to be made, you will be notified at that same time.   

## 2014-06-28 NOTE — Progress Notes (Signed)
Pre visit review using our clinic review tool, if applicable. No additional management support is needed unless otherwise documented below in the visit note. 

## 2014-06-28 NOTE — Assessment & Plan Note (Signed)
Diabetes appears to be stable and fairly well controlled with previous A1c at 7.0.  Continue current dosage of glimepiride. Discussed with patient about decreasing medication. Obtain A1c and BMET. Will determine decreases pending lab work.  Follow up in 6 months.

## 2014-06-28 NOTE — Telephone Encounter (Signed)
Pt aware of results 

## 2014-07-08 ENCOUNTER — Ambulatory Visit (INDEPENDENT_AMBULATORY_CARE_PROVIDER_SITE_OTHER): Payer: BC Managed Care – PPO | Admitting: Physician Assistant

## 2014-07-08 VITALS — BP 118/82 | HR 80 | Temp 97.9°F | Resp 18 | Ht 68.5 in | Wt 230.0 lb

## 2014-07-08 DIAGNOSIS — B9789 Other viral agents as the cause of diseases classified elsewhere: Principal | ICD-10-CM

## 2014-07-08 DIAGNOSIS — J069 Acute upper respiratory infection, unspecified: Secondary | ICD-10-CM

## 2014-07-08 MED ORDER — IPRATROPIUM BROMIDE 0.06 % NA SOLN: 2.0000 | Freq: Three times a day (TID) | NASAL | Status: DC

## 2014-07-08 MED ORDER — GUAIFENESIN ER 1200 MG PO TB12: 1.0000 | ORAL_TABLET | Freq: Two times a day (BID) | ORAL | Status: AC

## 2014-07-08 NOTE — Progress Notes (Signed)
Subjective:    Patient ID: Austin Valencia., male    DOB: Jul 26, 1965, 49 y.o.   MRN: [id number]  HPI Pt presents to clinic with 4 day h/o cold symptoms.  Everything seems worse in the am and then improves throughout the day.  His symptoms have not changed since they started 4 days ago.  He think he may have some allergies but this feels different/worse then the symptoms he normally has with allergies.    Home treatments - cold preps, left over PCN (a couple of days) Sick contacts - work,   Review of Systems  Constitutional: Positive for fever (subjective). Negative for chills.  HENT: Positive for congestion, postnasal drip, rhinorrhea (blood tinged) and sore throat (worse in the am).   Respiratory: Positive for cough.        No h/o asthma, h/o childhood allergies.  nonsmoker  Gastrointestinal: Positive for diarrhea. Negative for nausea and vomiting.  Allergic/Immunologic: Positive for environmental allergies.  Neurological: Positive for headaches.   Patient Active Problem List   Diagnosis Date Noted  . Type II diabetes mellitus with complication 53/66/4403  . 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 GERD 03/28/2009  . HYPERLIPIDEMIA 02/25/2009  . DEPRESSION 02/25/2009  . Essential hypertension 02/25/2009  . CHRONIC PROSTATITIS 02/25/2009   Prior to Admission medications   Medication Sig Start Date End Date Taking? Authorizing Provider  amLODipine (NORVASC) 5 MG tablet Take 5 mg by mouth daily.   Yes Historical Provider, MD  doxazosin (CARDURA) 8 MG tablet Take 0.5 tablets (4 mg total) by mouth at bedtime. 03/07/11  Yes Billy Fischer, MD  glimepiride (AMARYL) 4 MG tablet Take 1 tablet (4 mg total) by mouth daily with breakfast. 04/25/14  Yes Golden Circle, FNP  omeprazole (PRILOSEC) 20 MG capsule Take 20 mg by mouth daily.   Yes Historical Provider, MD   Allergies  Allergen Reactions  . 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 Ace Inhibitors     See 05/23/14 angioedema with  Losartan    Medications, allergies, past medical history, surgical history, family history, social history and problem list reviewed and updated.      Objective:   Physical Exam  Constitutional: He is oriented to person, place, and time. He appears well-developed and well-nourished.  BP 118/82 mmHg  Pulse 80  Temp(Src) 97.9 F (36.6 C) (Oral)  Resp 18  Ht 5' 8.5" (1.74 m)  Wt 230 lb (104.327 kg)  BMI 34.46 kg/m2  SpO2 95%   HENT:  Head: Normocephalic and atraumatic.  Right Ear: Hearing, tympanic membrane, external ear and ear canal normal.  Left Ear: Hearing, tympanic membrane, external ear and ear canal normal.  Nose: Mucosal edema (red) present.  Mouth/Throat: Uvula is midline, oropharynx is clear and moist and mucous membranes are normal.  Eyes: Conjunctivae are normal.  Neck: Normal range of motion.  Cardiovascular: Normal rate, regular rhythm and normal heart sounds.   No murmur heard. Pulmonary/Chest: Effort normal and breath sounds normal. He has no wheezes.  Lymphadenopathy:       Head (right side): No tonsillar and no occipital adenopathy present.       Head (left side): No tonsillar and no occipital adenopathy present.    He has no cervical adenopathy.       Right: No supraclavicular adenopathy present.       Left: No supraclavicular adenopathy present.  Neurological: He is alert and oriented to person, place, and time.  Skin: Skin  is warm and dry.  Psychiatric: He has a normal mood and affect. His behavior is normal. Judgment and thought content normal.      Assessment & Plan:  Viral URI with cough - Plan: Guaifenesin (MUCINEX MAXIMUM STRENGTH) 1200 MG TB12, ipratropium (ATROVENT) 0.06 % nasal spray  Symptomatic care d/w pt.  Windell Hummingbird PA-C  [facility] 07/08/2014 11:37 AM

## 2014-07-08 NOTE — Patient Instructions (Signed)
Please push fluids.  Tylenol and Motrin for fever and body aches.    

## 2014-08-29 ENCOUNTER — Ambulatory Visit (INDEPENDENT_AMBULATORY_CARE_PROVIDER_SITE_OTHER): Payer: BC Managed Care – PPO | Admitting: Family

## 2014-08-29 ENCOUNTER — Encounter: Payer: Self-pay | Admitting: Family

## 2014-08-29 VITALS — BP 116/76 | HR 70 | Temp 98.2°F | Wt 222.2 lb

## 2014-08-29 DIAGNOSIS — E118 Type 2 diabetes mellitus with unspecified complications: Secondary | ICD-10-CM

## 2014-08-29 DIAGNOSIS — I10 Essential (primary) hypertension: Secondary | ICD-10-CM | POA: Diagnosis not present

## 2014-08-29 MED ORDER — DOXAZOSIN MESYLATE 4 MG PO TABS: 4.0000 mg | ORAL_TABLET | Freq: Every day | ORAL | Status: DC

## 2014-08-29 MED ORDER — METFORMIN HCL 500 MG PO TABS: 500.0000 mg | ORAL_TABLET | Freq: Two times a day (BID) | ORAL | Status: DC

## 2014-08-29 NOTE — Progress Notes (Signed)
Pre visit review using our clinic review tool, if applicable. No additional management support is needed unless otherwise documented below in the visit note. 

## 2014-08-29 NOTE — Patient Instructions (Signed)
Thank you for choosing [facility].  Summary/Instructions:  Please continue to take your blood sugars at home.  Please continue to take your medications as prescribed and START the METFORMIN.  Please follow up with the LAB in about 1 MONTH for your A1C. You do not have to have an appointment or be fasting.   Your prescription(s) have been submitted to your pharmacy or been printed and provided for you. Please take as directed and contact our office if you believe you are having problem(s) with the medication(s) or have any questions.  If your symptoms worsen or fail to improve, please contact our office for further instruction, or in case of emergency go directly to the emergency room at the closest medical facility.

## 2014-08-29 NOTE — Progress Notes (Signed)
3  Subjective:    Patient ID: Austin Valencia., male    DOB: [birth year], 49 y.o.   MRN: [id number]  Chief Complaint  Patient presents with  . Follow-up    Diabetes and hypertension    HPI:  [name]. is a 49 y.o. male with a PMH of hyperlipidemia, depression, hypertension, and type 2 diabetes who presents today for an office follow-up.    1.) Type 2 diabetes - Indicates that his home blood sugars have been increased around the 300 area. He is currently taking glimepiride. Takes the medication as prescribed and denies any adverse side effects or hypoglycemic events.  Lab Results  Component Value Date   HGBA1C 7.4* 06/28/2014    2.) Hypertension - Currently maintained on amlodipine. Takes his medications as prescribed and denies adverse side effects.   BP Readings from Last 3 Encounters:  08/29/14 116/76  07/08/14 118/82  06/28/14 120/82    Allergies  Allergen Reactions  . 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 Ace Inhibitors     See 05/23/14 angioedema with Losartan    Current Outpatient Prescriptions on File Prior to Visit  Medication Sig Dispense Refill  . amLODipine (NORVASC) 5 MG tablet Take 5 mg by mouth daily.    Marland Kitchen glimepiride (AMARYL) 4 MG tablet Take 1 tablet (4 mg total) by mouth daily with breakfast. 30 tablet 3  . ipratropium (ATROVENT) 0.06 % nasal spray Place 2 sprays into the nose 3 (three) times daily. 15 mL 0  . omeprazole (PRILOSEC) 20 MG capsule Take 20 mg by mouth daily.     No current facility-administered medications on file prior to visit.    Review of Systems  Eyes:       Negative for changes in vision.   Respiratory: Negative for chest tightness and shortness of breath.   Cardiovascular: Negative for chest pain, palpitations and leg swelling.  Neurological: Positive for headaches.      Objective:    BP 116/76 mmHg  Pulse 70  Temp(Src) 98.2 F (36.8 C)  Wt 222 lb 4 oz (100.812 kg)  SpO2 94% Nursing note and vital  signs reviewed.  Physical Exam  Constitutional: He is oriented to person, place, and time. He appears well-developed and well-nourished. No distress.  Cardiovascular: Normal rate, regular rhythm, normal heart sounds and intact distal pulses.   Pulmonary/Chest: Effort normal and breath sounds normal.  Neurological: He is alert and oriented to person, place, and time.  Skin: Skin is warm and dry.  Psychiatric: He has a normal mood and affect. His behavior is normal. Judgment and thought content normal.       Assessment & Plan:   Problem List Items Addressed This Visit      Cardiovascular and Mediastinum   Essential hypertension    Blood pressure remains below goal of 140/90 with current regimen of amlodipine. Continue current dosage of amlodipine. Continue to monitor blood pressure at home. Follow-up with diabetes.      Relevant Medications   doxazosin (CARDURA) 4 MG tablet     Endocrine   Type II diabetes mellitus with complication - Primary    Indicates increased blood sugar at home greater than 300 on occasion. Currently maintained on glimepiride. Continue current dosage of glimepiride. Start metformin. Referral placed for diabetic education. Follow-up A1c in 1 month.      Relevant Medications   metFORMIN (GLUCOPHAGE) 500 MG tablet   Other Relevant Orders   Ambulatory referral  to diabetic education

## 2014-08-29 NOTE — Assessment & Plan Note (Signed)
Indicates increased blood sugar at home greater than 300 on occasion. Currently maintained on glimepiride. Continue current dosage of glimepiride. Start metformin. Referral placed for diabetic education. Follow-up A1c in 1 month.

## 2014-08-29 NOTE — Assessment & Plan Note (Signed)
Blood pressure remains below goal of 140/90 with current regimen of amlodipine. Continue current dosage of amlodipine. Continue to monitor blood pressure at home. Follow-up with diabetes.

## 2014-09-23 ENCOUNTER — Ambulatory Visit (INDEPENDENT_AMBULATORY_CARE_PROVIDER_SITE_OTHER): Payer: BC Managed Care – PPO

## 2014-09-23 ENCOUNTER — Ambulatory Visit (INDEPENDENT_AMBULATORY_CARE_PROVIDER_SITE_OTHER): Payer: BC Managed Care – PPO | Admitting: Family Medicine

## 2014-09-23 VITALS — BP 110/70 | HR 79 | Temp 98.6°F | Resp 16 | Ht 68.0 in | Wt 216.0 lb

## 2014-09-23 DIAGNOSIS — M25561 Pain in right knee: Secondary | ICD-10-CM

## 2014-09-23 DIAGNOSIS — M25461 Effusion, right knee: Secondary | ICD-10-CM | POA: Diagnosis not present

## 2014-09-23 NOTE — Progress Notes (Signed)
Subjective:    Patient ID: Austin Roche., male    DOB: 1966-03-04, 49 y.o.   MRN: [id number]  HPI Austin Witkop. is a 49 y.o. male  C/o R knee pain past 2 weeks.  No known injury.  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  Noticed when woke up one morning and had pain.  Walking ok - with a limp. Slightly tight, hard to bend all the way back.  No locking or giving way, just some popping at times, occasional catching sensation. No prior surgery, but had cortisone shot to one of his knees 3 years ago by Dr. Noemi Chapel.   Tx: over the counter knee pad.  Has not taken any new medications for this. Initial ice, but none recently.    Patient Active Problem List   Diagnosis Date Noted  . Type II diabetes mellitus with complication 97/04/6376  . 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 GERD 03/28/2009  . HYPERLIPIDEMIA 02/25/2009  . DEPRESSION 02/25/2009  . Essential hypertension 02/25/2009  . CHRONIC PROSTATITIS 02/25/2009   Past Medical History  Diagnosis Date  . Hypertension   . GERD (gastroesophageal reflux disease)   . Hyperlipidemia   . Diabetes mellitus without complication   . HYIFOYDX(412.8)    Past Surgical History  Procedure Laterality Date  . Lipoma excision Right 11/29/2013    Procedure: EXCISION LIPOMA RIGHT AXILLA;  Surgeon: Autumn Messing III, MD;  Location: [facility];  Service: General;  Laterality: Right;  . Hernia repair      umbilical child   Allergies  Allergen Reactions  . 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 Ace Inhibitors     See 05/23/14 angioedema with Losartan   Prior to Admission medications   Medication Sig Start Date End Date Taking? Authorizing Provider  amLODipine (NORVASC) 5 MG tablet Take 5 mg by mouth daily.   Yes Historical Provider, MD  doxazosin (CARDURA) 4 MG tablet Take 1 tablet (4 mg total) by mouth daily. 08/29/14  Yes Golden Circle, FNP  glimepiride (AMARYL) 4 MG tablet Take 1 tablet (4 mg total) by mouth daily with  breakfast. 04/25/14  Yes Golden Circle, FNP  metFORMIN (GLUCOPHAGE) 500 MG tablet Take 1 tablet (500 mg total) by mouth 2 (two) times daily with a meal. 08/29/14  Yes Golden Circle, FNP  omeprazole (PRILOSEC) 20 MG capsule Take 20 mg by mouth daily.   Yes Historical Provider, MD  ipratropium (ATROVENT) 0.06 % nasal spray Place 2 sprays into the nose 3 (three) times daily. Patient not taking: Reported on 09/23/2014 07/08/14   Mancel Bale, PA-C   History   Social History  . Marital Status: Single    Spouse Name: N/A  . Number of Children: 2  . Years of Education: 12   Occupational History  . Custodian    Social History Main Topics  . Smoking status: Never Smoker   . Smokeless tobacco: Never Used  . Alcohol Use: No  . Drug Use: No  . Sexual Activity: Yes   Other Topics Concern  . Not on file   Social History Narrative   Born in [place], [place] and raised [place], [place]. Currently resides in a house with his mother. No pets. Fun: Go to sporting events, concerts   Denies religious beliefs that would effect health care.      Review of Systems  Constitutional: Negative for fever and chills.  Musculoskeletal: Positive for joint swelling  and arthralgias.  Skin: Negative for rash and wound.       Objective:   Physical Exam  Constitutional: He is oriented to person, place, and time. He appears well-developed and well-nourished. No distress.  Cardiovascular: Normal rate and regular rhythm.   Pulmonary/Chest: Effort normal and breath sounds normal.  Musculoskeletal:       Right knee: He exhibits decreased range of motion (lacking approx 15-20 degrees flex vs. left. full extension, no extensor lag. ) and bony tenderness (medial jt line. ). He exhibits no LCL laxity, normal patellar mobility and normal meniscus (no click with McMurray, but some pain in medial jt line with knee flexion/IR. ). Tenderness found. Medial joint line tenderness noted. No MCL, no LCL and no patellar tendon  tenderness noted.  Negative anterior/posterior drawer. Negative lachman's.   Neurological: He is alert and oriented to person, place, and time.  NVI distally  Skin: Skin is warm and dry. No rash noted.  Psychiatric: He has a normal mood and affect. His behavior is normal.  Vitals reviewed.  Filed Vitals:   09/23/14 1339  BP: 110/70  Pulse: 79  Temp: 98.6 F (37 C)  TempSrc: Oral  Resp: 16  Height: 5\' 8"  (1.727 m)  Weight: 216 lb (97.977 kg)  SpO2: 96%    [facility] reading (PRIMARY) by  Dr. Carlota Raspberry: Right knee. Remote fragmentation of tibial tubercle. No acute bony findings.     Assessment & Plan:  Austin Ezzell. is a 49 y.o. male Right knee pain - Plan: DG Knee Complete 4 Views Right  Knee swelling, right  Possible flare of osteoarthritis, or degenerative meniscus tear without known injury. No apparent instability on exam, and minimal effusion. We'll try hinged knee brace, Tylenol or episodic use of NSAID given his history of hypertension and diabetes (heart risks discussed of NSAIDs), ice and elevation as needed.  If not improving in the next week to 10 days, consider orthopedic evaluation for possible injection versus further imaging. Return to clinic precautions discussed.   No orders of the defined types were placed in this encounter.   Patient Instructions  Your knee pain may be due to a flare of arthritis or injury to the meniscus (the cartilage inside the knee). Start with over-the-counter ibuprofen or Aleve just as needed (with diabetes and high blood pressure, there is some risk to heart with these medications, so only use as needed).  Ice to knee at the end of the day as necessary, wear the hinged knee brace as needed, and if not improving in the next week to 10 days, I can refer you back to Dr. Noemi Chapel for evaluation. Return to the clinic or go to the nearest emergency room if any of your symptoms worsen or new symptoms occur.   Knee Pain The knee is the complex joint  between your thigh and your lower leg. It is made up of bones, tendons, ligaments, and cartilage. The bones that make up the knee are:  The femur in the thigh.  The tibia and fibula in the lower leg.  The patella or kneecap riding in the groove on the lower femur. CAUSES  Knee pain is a common complaint with many causes. A few of these causes are:  Injury, such as:  A ruptured ligament or tendon injury.  Torn cartilage.  Medical conditions, such as:  Gout  Arthritis  Infections  Overuse, over training, or overdoing a physical activity. Knee pain can be minor or severe. Knee pain  can accompany debilitating injury. Minor knee problems often respond well to self-care measures or get well on their own. More serious injuries may need medical intervention or even surgery. SYMPTOMS The knee is complex. Symptoms of knee problems can vary widely. Some of the problems are:  Pain with movement and weight bearing.  Swelling and tenderness.  Buckling of the knee.  Inability to straighten or extend your knee.  Your knee locks and you cannot straighten it.  Warmth and redness with pain and fever.  Deformity or dislocation of the kneecap. DIAGNOSIS  Determining what is wrong may be very straight forward such as when there is an injury. It can also be challenging because of the complexity of the knee. Tests to make a diagnosis may include:  Your caregiver taking a history and doing a physical exam.  Routine X-rays can be used to rule out other problems. X-rays will not reveal a cartilage tear. Some injuries of the knee can be diagnosed by:  Arthroscopy a surgical technique by which a small video camera is inserted through tiny incisions on the sides of the knee. This procedure is used to examine and repair internal knee joint problems. Tiny instruments can be used during arthroscopy to repair the torn knee cartilage (meniscus).  Arthrography is a radiology technique. A contrast  liquid is directly injected into the knee joint. Internal structures of the knee joint then become visible on X-ray film.  An MRI scan is a non X-ray radiology procedure in which magnetic fields and a computer produce two- or three-dimensional images of the inside of the knee. Cartilage tears are often visible using an MRI scanner. MRI scans have largely replaced arthrography in diagnosing cartilage tears of the knee.  Blood work.  Examination of the fluid that helps to lubricate the knee joint (synovial fluid). This is done by taking a sample out using a needle and a syringe. TREATMENT The treatment of knee problems depends on the cause. Some of these treatments are:  Depending on the injury, proper casting, splinting, surgery, or physical therapy care will be needed.  Give yourself adequate recovery time. Do not overuse your joints. If you begin to get sore during workout routines, back off. Slow down or do fewer repetitions.  For repetitive activities such as cycling or running, maintain your strength and nutrition.  Alternate muscle groups. For example, if you are a weight lifter, work the upper body on one day and the lower body the next.  Either tight or weak muscles do not give the proper support for your knee. Tight or weak muscles do not absorb the stress placed on the knee joint. Keep the muscles surrounding the knee strong.  Take care of mechanical problems.  If you have flat feet, orthotics or special shoes may help. See your caregiver if you need help.  Arch supports, sometimes with wedges on the inner or outer aspect of the heel, can help. These can shift pressure away from the side of the knee most bothered by osteoarthritis.  A brace called an "unloader" brace also may be used to help ease the pressure on the most arthritic side of the knee.  If your caregiver has prescribed crutches, braces, wraps or ice, use as directed. The acronym for this is PRICE. This means  protection, rest, ice, compression, and elevation.  Nonsteroidal anti-inflammatory drugs (NSAIDs), can help relieve pain. But if taken immediately after an injury, they may actually increase swelling. Take NSAIDs with food in your stomach. Stop  them if you develop stomach problems. Do not take these if you have a history of ulcers, stomach pain, or bleeding from the bowel. Do not take without your caregiver's approval if you have problems with fluid retention, heart failure, or kidney problems.  For ongoing knee problems, physical therapy may be helpful.  Glucosamine and chondroitin are over-the-counter dietary supplements. Both may help relieve the pain of osteoarthritis in the knee. These medicines are different from the usual anti-inflammatory drugs. Glucosamine may decrease the rate of cartilage destruction.  Injections of a corticosteroid drug into your knee joint may help reduce the symptoms of an arthritis flare-up. They may provide pain relief that lasts a few months. You may have to wait a few months between injections. The injections do have a small increased risk of infection, water retention, and elevated blood sugar levels.  Hyaluronic acid injected into damaged joints may ease pain and provide lubrication. These injections may work by reducing inflammation. A series of shots may give relief for as long as 6 months.  Topical painkillers. Applying certain ointments to your skin may help relieve the pain and stiffness of osteoarthritis. Ask your pharmacist for suggestions. Many over the-counter products are approved for temporary relief of arthritis pain.  In some countries, doctors often prescribe topical NSAIDs for relief of chronic conditions such as arthritis and tendinitis. A review of treatment with NSAID creams found that they worked as well as oral medications but without the serious side effects. PREVENTION  Maintain a healthy weight. Extra pounds put more strain on your  joints.  Get strong, stay limber. Weak muscles are a common cause of knee injuries. Stretching is important. Include flexibility exercises in your workouts.  Be smart about exercise. If you have osteoarthritis, chronic knee pain or recurring injuries, you may need to change the way you exercise. This does not mean you have to stop being active. If your knees ache after jogging or playing basketball, consider switching to swimming, water aerobics, or other low-impact activities, at least for a few days a week. Sometimes limiting high-impact activities will provide relief.  Make sure your shoes fit well. Choose footwear that is right for your sport.  Protect your knees. Use the proper gear for knee-sensitive activities. Use kneepads when playing volleyball or laying carpet. Buckle your seat belt every time you drive. Most shattered kneecaps occur in car accidents.  Rest when you are tired. SEEK MEDICAL CARE IF:  You have knee pain that is continual and does not seem to be getting better.  SEEK IMMEDIATE MEDICAL CARE IF:  Your knee joint feels hot to the touch and you have a high fever. MAKE SURE YOU:   Understand these instructions.  Will watch your condition.  Will get help right away if you are not doing well or get worse. Document Released: 01/04/2007 Document Revised: 06/01/2011 Document Reviewed: 01/04/2007 [facility] Patient Information 2015 [place], [place]. This information is not intended to replace advice given to you by your health care provider. Make sure you discuss any questions you have with your health care provider.

## 2014-09-23 NOTE — Patient Instructions (Signed)
Your knee pain may be due to a flare of arthritis or injury to the meniscus (the cartilage inside the knee). Start with over-the-counter ibuprofen or Aleve just as needed (with diabetes and high blood pressure, there is some risk to heart with these medications, so only use as needed).  Ice to knee at the end of the day as necessary, wear the hinged knee brace as needed, and if not improving in the next week to 10 days, I can refer you back to Dr. Noemi Chapel for evaluation. Return to the clinic or go to the nearest emergency room if any of your symptoms worsen or new symptoms occur.   Knee Pain The knee is the complex joint between your thigh and your lower leg. It is made up of bones, tendons, ligaments, and cartilage. The bones that make up the knee are:  The femur in the thigh.  The tibia and fibula in the lower leg.  The patella or kneecap riding in the groove on the lower femur. CAUSES  Knee pain is a common complaint with many causes. A few of these causes are:  Injury, such as:  A ruptured ligament or tendon injury.  Torn cartilage.  Medical conditions, such as:  Gout  Arthritis  Infections  Overuse, over training, or overdoing a physical activity. Knee pain can be minor or severe. Knee pain can accompany debilitating injury. Minor knee problems often respond well to self-care measures or get well on their own. More serious injuries may need medical intervention or even surgery. SYMPTOMS The knee is complex. Symptoms of knee problems can vary widely. Some of the problems are:  Pain with movement and weight bearing.  Swelling and tenderness.  Buckling of the knee.  Inability to straighten or extend your knee.  Your knee locks and you cannot straighten it.  Warmth and redness with pain and fever.  Deformity or dislocation of the kneecap. DIAGNOSIS  Determining what is wrong may be very straight forward such as when there is an injury. It can also be challenging because  of the complexity of the knee. Tests to make a diagnosis may include:  Your caregiver taking a history and doing a physical exam.  Routine X-rays can be used to rule out other problems. X-rays will not reveal a cartilage tear. Some injuries of the knee can be diagnosed by:  Arthroscopy a surgical technique by which a small video camera is inserted through tiny incisions on the sides of the knee. This procedure is used to examine and repair internal knee joint problems. Tiny instruments can be used during arthroscopy to repair the torn knee cartilage (meniscus).  Arthrography is a radiology technique. A contrast liquid is directly injected into the knee joint. Internal structures of the knee joint then become visible on X-ray film.  An MRI scan is a non X-ray radiology procedure in which magnetic fields and a computer produce two- or three-dimensional images of the inside of the knee. Cartilage tears are often visible using an MRI scanner. MRI scans have largely replaced arthrography in diagnosing cartilage tears of the knee.  Blood work.  Examination of the fluid that helps to lubricate the knee joint (synovial fluid). This is done by taking a sample out using a needle and a syringe. TREATMENT The treatment of knee problems depends on the cause. Some of these treatments are:  Depending on the injury, proper casting, splinting, surgery, or physical therapy care will be needed.  Give yourself adequate recovery time. Do not  overuse your joints. If you begin to get sore during workout routines, back off. Slow down or do fewer repetitions.  For repetitive activities such as cycling or running, maintain your strength and nutrition.  Alternate muscle groups. For example, if you are a weight lifter, work the upper body on one day and the lower body the next.  Either tight or weak muscles do not give the proper support for your knee. Tight or weak muscles do not absorb the stress placed on the knee  joint. Keep the muscles surrounding the knee strong.  Take care of mechanical problems.  If you have flat feet, orthotics or special shoes may help. See your caregiver if you need help.  Arch supports, sometimes with wedges on the inner or outer aspect of the heel, can help. These can shift pressure away from the side of the knee most bothered by osteoarthritis.  A brace called an "unloader" brace also may be used to help ease the pressure on the most arthritic side of the knee.  If your caregiver has prescribed crutches, braces, wraps or ice, use as directed. The acronym for this is PRICE. This means protection, rest, ice, compression, and elevation.  Nonsteroidal anti-inflammatory drugs (NSAIDs), can help relieve pain. But if taken immediately after an injury, they may actually increase swelling. Take NSAIDs with food in your stomach. Stop them if you develop stomach problems. Do not take these if you have a history of ulcers, stomach pain, or bleeding from the bowel. Do not take without your caregiver's approval if you have problems with fluid retention, heart failure, or kidney problems.  For ongoing knee problems, physical therapy may be helpful.  Glucosamine and chondroitin are over-the-counter dietary supplements. Both may help relieve the pain of osteoarthritis in the knee. These medicines are different from the usual anti-inflammatory drugs. Glucosamine may decrease the rate of cartilage destruction.  Injections of a corticosteroid drug into your knee joint may help reduce the symptoms of an arthritis flare-up. They may provide pain relief that lasts a few months. You may have to wait a few months between injections. The injections do have a small increased risk of infection, water retention, and elevated blood sugar levels.  Hyaluronic acid injected into damaged joints may ease pain and provide lubrication. These injections may work by reducing inflammation. A series of shots may give  relief for as long as 6 months.  Topical painkillers. Applying certain ointments to your skin may help relieve the pain and stiffness of osteoarthritis. Ask your pharmacist for suggestions. Many over the-counter products are approved for temporary relief of arthritis pain.  In some countries, doctors often prescribe topical NSAIDs for relief of chronic conditions such as arthritis and tendinitis. A review of treatment with NSAID creams found that they worked as well as oral medications but without the serious side effects. PREVENTION  Maintain a healthy weight. Extra pounds put more strain on your joints.  Get strong, stay limber. Weak muscles are a common cause of knee injuries. Stretching is important. Include flexibility exercises in your workouts.  Be smart about exercise. If you have osteoarthritis, chronic knee pain or recurring injuries, you may need to change the way you exercise. This does not mean you have to stop being active. If your knees ache after jogging or playing basketball, consider switching to swimming, water aerobics, or other low-impact activities, at least for a few days a week. Sometimes limiting high-impact activities will provide relief.  Make sure your shoes fit  well. Choose footwear that is right for your sport.  Protect your knees. Use the proper gear for knee-sensitive activities. Use kneepads when playing volleyball or laying carpet. Buckle your seat belt every time you drive. Most shattered kneecaps occur in car accidents.  Rest when you are tired. SEEK MEDICAL CARE IF:  You have knee pain that is continual and does not seem to be getting better.  SEEK IMMEDIATE MEDICAL CARE IF:  Your knee joint feels hot to the touch and you have a high fever. MAKE SURE YOU:   Understand these instructions.  Will watch your condition.  Will get help right away if you are not doing well or get worse. Document Released: 01/04/2007 Document Revised: 06/01/2011 Document  Reviewed: 01/04/2007 [facility] Patient Information 2015 [place], [place]. This information is not intended to replace advice given to you by your health care provider. Make sure you discuss any questions you have with your health care provider.

## 2014-10-01 ENCOUNTER — Other Ambulatory Visit: Payer: Self-pay | Admitting: Family

## 2014-10-01 ENCOUNTER — Telehealth: Payer: Self-pay | Admitting: Family

## 2014-10-01 ENCOUNTER — Other Ambulatory Visit (INDEPENDENT_AMBULATORY_CARE_PROVIDER_SITE_OTHER): Payer: BC Managed Care – PPO

## 2014-10-01 DIAGNOSIS — E118 Type 2 diabetes mellitus with unspecified complications: Secondary | ICD-10-CM

## 2014-10-01 LAB — HEMOGLOBIN A1C: Hgb A1c MFr Bld: 9.6 % — ABNORMAL HIGH (ref 4.6–6.5)

## 2014-10-01 NOTE — Telephone Encounter (Signed)
Please inform patient that his A1c was 9.6 up from 7.4. We did add his metformin about 1 month ago so it may take a little to see the changes. Please find out what his morning blood sugar average is.

## 2014-10-02 MED ORDER — SITAGLIPTIN PHOSPHATE 100 MG PO TABS: 100.0000 mg | ORAL_TABLET | Freq: Every day | ORAL | Status: DC

## 2014-10-02 NOTE — Telephone Encounter (Signed)
I have sent a new prescription for Januvia for him to add to his current medications because his morning blood sugars remain to high. The goal is 80-130.

## 2014-10-02 NOTE — Telephone Encounter (Signed)
Sometimes 200s lower 300s

## 2014-10-03 NOTE — Telephone Encounter (Signed)
Pt aware.

## 2014-10-26 ENCOUNTER — Ambulatory Visit: Payer: BC Managed Care – PPO | Admitting: *Deleted

## 2014-11-15 ENCOUNTER — Other Ambulatory Visit: Payer: Self-pay | Admitting: Family

## 2014-12-22 ENCOUNTER — Other Ambulatory Visit: Payer: Self-pay | Admitting: Family

## 2015-01-02 ENCOUNTER — Other Ambulatory Visit: Payer: Self-pay | Admitting: Family

## 2015-01-24 ENCOUNTER — Telehealth: Payer: Self-pay | Admitting: Family

## 2015-01-24 ENCOUNTER — Other Ambulatory Visit (INDEPENDENT_AMBULATORY_CARE_PROVIDER_SITE_OTHER): Payer: BC Managed Care – PPO

## 2015-01-24 ENCOUNTER — Ambulatory Visit (INDEPENDENT_AMBULATORY_CARE_PROVIDER_SITE_OTHER): Payer: BC Managed Care – PPO | Admitting: Family

## 2015-01-24 ENCOUNTER — Encounter: Payer: Self-pay | Admitting: Family

## 2015-01-24 VITALS — BP 118/76 | HR 83 | Temp 98.2°F | Resp 18 | Ht 68.0 in | Wt 234.8 lb

## 2015-01-24 DIAGNOSIS — Z0000 Encounter for general adult medical examination without abnormal findings: Secondary | ICD-10-CM

## 2015-01-24 DIAGNOSIS — R7989 Other specified abnormal findings of blood chemistry: Secondary | ICD-10-CM | POA: Diagnosis not present

## 2015-01-24 DIAGNOSIS — N411 Chronic prostatitis: Secondary | ICD-10-CM

## 2015-01-24 LAB — CBC
HCT: 44.3 % (ref 39.0–52.0)
Hemoglobin: 15.3 g/dL (ref 13.0–17.0)
MCHC: 34.6 g/dL (ref 30.0–36.0)
MCV: 94.5 fl (ref 78.0–100.0)
PLATELETS: 236 10*3/uL (ref 150.0–400.0)
RBC: 4.69 Mil/uL (ref 4.22–5.81)
RDW: 13.3 % (ref 11.5–15.5)
WBC: 5.8 10*3/uL (ref 4.0–10.5)

## 2015-01-24 LAB — COMPREHENSIVE METABOLIC PANEL
ALBUMIN: 4.6 g/dL (ref 3.5–5.2)
ALT: 21 U/L (ref 0–53)
AST: 18 U/L (ref 0–37)
Alkaline Phosphatase: 79 U/L (ref 39–117)
BILIRUBIN TOTAL: 0.6 mg/dL (ref 0.2–1.2)
BUN: 11 mg/dL (ref 6–23)
CALCIUM: 10 mg/dL (ref 8.4–10.5)
CO2: 28 mEq/L (ref 19–32)
CREATININE: 1.35 mg/dL (ref 0.40–1.50)
Chloride: 102 mEq/L (ref 96–112)
GFR: 72.81 mL/min (ref >60.00)
Glucose, Bld: 193 mg/dL — ABNORMAL HIGH (ref 70–99)
Potassium: 4.6 mEq/L (ref 3.5–5.1)
Sodium: 138 mEq/L (ref 135–145)
Total Protein: 7.2 g/dL (ref 6.0–8.3)

## 2015-01-24 LAB — LIPID PANEL
CHOLESTEROL: 129 mg/dL (ref 0–200)
HDL: 27.7 mg/dL — ABNORMAL LOW (ref >39.00)
Total CHOL/HDL Ratio: 5

## 2015-01-24 LAB — TSH: TSH: 0.75 u[IU]/mL (ref 0.35–4.50)

## 2015-01-24 LAB — HEMOGLOBIN A1C: Hgb A1c MFr Bld: 6.4 % (ref 4.6–6.5)

## 2015-01-24 LAB — PSA: PSA: 2.73 ng/mL (ref 0.10–4.00)

## 2015-01-24 LAB — LDL CHOLESTEROL, DIRECT: Direct LDL: 69 mg/dL

## 2015-01-24 MED ORDER — SIMVASTATIN 10 MG PO TABS: 10.0000 mg | ORAL_TABLET | Freq: Every day | ORAL | Status: DC

## 2015-01-24 NOTE — Assessment & Plan Note (Signed)
1) Anticipatory Guidance: Discussed importance of wearing a seatbelt while driving and not texting while driving; changing batteries in smoke detector at least once annually; wearing suntan lotion when outside; eating a balanced and moderate diet; getting physical activity at least 30 minutes per day.  2) Immunizations / Screenings / Labs:  Due for Pneumovax and tetanus at next office visit. All other immunizations are up to date per recommendations. Obtain PSA for prostate screening. Obtain A1c for diabetes screen. All other screenings are up to date per recommendation. Obtain CBC, CMET, Lipid profile and TSH.   Overall well exam. Risk factors for cardiovascular disease include diabetes, hyperlipidemia, hypertension, obesity, sedentary lifestyle and poor nutrition. Chronic conditions currently managed with medications. Discussed importance of nutrition that provides variety and is moderate. Recommend low carbohydrate secondary to diabetes. Encouraged nutrient dense foods and decreased saturated fats with reduction of empty calories and processed foods. Increase physical activity to 30 minutes daily. Recommended weight loss of 5-10% of current body weight. Continue previously prescribed medications. Follow up office visit pending blood work to review chronic conditions. Follow up prevention exam in 1 year.

## 2015-01-24 NOTE — Telephone Encounter (Signed)
Please inform patient that his A1c is 6.4 which is excellent and therefore no changes are needed for his diabetes. His prostate, thyroid function, liver function, kidney function, white/red blood cells are within the normal limits. His cholesterol is an area that does need work. His HDL or good cholesterol is low and his triglycerides are high, which is the fat circulating in the blood. Therefore I have sent a prescription for simvastatin to his pharmacy to reduce his risk of coronary artery disease. If he develops muscle pain after starting the medication, please let us know. Otherwise we can follow up in about 3 months.

## 2015-01-24 NOTE — Progress Notes (Signed)
Subjective:    Patient ID: Austin Valencia., male    DOB: [birth year], 49 y.o.   MRN: [id number]  Chief Complaint  Patient presents with  . CPE    Fasting    HPI:  Dagen Beevers. is a 49 y.o. male who presents today for an annual wellness visit.   1) Health Maintenance -   Diet - Regular diet; Averages about 3 meals per day consisting of beef, pork, sweets, vegetables, sodas; Average caffeine intake of about 6-8 cups of caffeine daily  Exercise - No structured exercise   2) Preventative Exams / Immunizations:  Dental -- Up to date  Vision -- Up to date   Health Maintenance  Topic Date Due  . PNEUMOCOCCAL POLYSACCHARIDE VACCINE (1) 12/06/1969  . TETANUS/TDAP  12/07/1986  . FOOT EXAM  03/29/2015  . URINE MICROALBUMIN  03/29/2015  . HEMOGLOBIN A1C  04/03/2015  . OPHTHALMOLOGY EXAM  04/26/2015  . INFLUENZA VACCINE  10/22/2015  . HIV Screening  Completed  PPSV 23; Next office visit tetanus shot.    There is no immunization history on file for this patient.  Allergies  Allergen Reactions  . 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 Ace Inhibitors     See 05/23/14 angioedema with Losartan     Outpatient Prescriptions Prior to Visit  Medication Sig Dispense Refill  . amLODipine (NORVASC) 5 MG tablet Take 5 mg by mouth daily.    Marland Kitchen doxazosin (CARDURA) 4 MG tablet Take 1 tablet (4 mg total) by mouth daily. 90 tablet 1  . glimepiride (AMARYL) 4 MG tablet TAKE ONE TABLET BY MOUTH ONCE DAILY WITH BREAKFAST 30 tablet 0  . JANUVIA 100 MG tablet TAKE ONE TABLET BY MOUTH ONCE DAILY 30 tablet 0  . omeprazole (PRILOSEC) 20 MG capsule Take 20 mg by mouth daily.    . metFORMIN (GLUCOPHAGE) 500 MG tablet Take 1 tablet (500 mg total) by mouth 2 (two) times daily with a meal. 60 tablet 0  . ipratropium (ATROVENT) 0.06 % nasal spray Place 2 sprays into the nose 3 (three) times daily. (Patient not taking: Reported on 09/23/2014) 15 mL 0   No facility-administered  medications prior to visit.     Past Medical History  Diagnosis Date  . Hypertension   . GERD (gastroesophageal reflux disease)   . Hyperlipidemia   . Diabetes mellitus without complication ([place])   . WCHENIDP(824.2)      Past Surgical History  Procedure Laterality Date  . Lipoma excision Right 11/29/2013    Procedure: EXCISION LIPOMA RIGHT AXILLA;  Surgeon: Autumn Messing III, MD;  Location: [facility];  Service: General;  Laterality: Right;  . Hernia repair      umbilical child     Family History  Problem Relation Age of Onset  . Cancer Father     lung  . Diabetes Father   . Hypertension Father   . Hyperlipidemia Mother   . Hypertension Mother      Social History   Social History  . Marital Status: Single    Spouse Name: N/A  . Number of Children: 2  . Years of Education: 12   Occupational History  . Custodian    Social History Main Topics  . Smoking status: Never Smoker   . Smokeless tobacco: Never Used  . Alcohol Use: No  . Drug Use: No  . Sexual Activity: Yes   Other Topics Concern  . Not on file  Social History Narrative   Born in [place], [place] and raised [place], [place]. Currently resides in a house with his mother. No pets. Fun: Go to sporting events, concerts   Denies religious beliefs that would effect health care.      Review of Systems  Constitutional: Denies fever, chills, fatigue, or significant weight gain/loss. HENT: Head: Denies headache or neck pain Ears: Denies changes in hearing, ringing in ears, earache, drainage Nose: Denies discharge, stuffiness, itching, nosebleed, sinus pain Throat: Denies sore throat, hoarseness, dry mouth, sores, thrush Eyes: Denies loss/changes in vision, pain, redness, blurry/double vision, flashing lights Cardiovascular: Denies chest pain/discomfort, tightness, palpitations, shortness of breath with activity, difficulty lying down, swelling, sudden awakening with shortness of breath Respiratory:  Denies shortness of breath, cough, sputum production, wheezing Gastrointestinal: Denies dysphasia, heartburn, change in appetite, nausea, change in bowel habits, rectal bleeding, constipation, diarrhea, yellow skin or eyes Genitourinary: Denies frequency, urgency, burning/pain, blood in urine, Positive for decreased urine stream and some dribbling Musculoskeletal: Denies muscle/joint pain, stiffness, back pain, redness or swelling of joints, trauma Skin: Denies rashes, lumps, itching, dryness, color changes, or hair/nail changes Neurological: Denies dizziness, fainting, seizures, weakness, numbness, tingling, tremor Psychiatric - Denies nervousness, stress, depression or memory loss Endocrine: Denies heat or cold intolerance, sweating, frequent urination, excessive thirst, changes in appetite Hematologic: Denies ease of bruising or bleeding     Objective:     BP 118/76 mmHg  Pulse 83  Temp(Src) 98.2 F (36.8 C) (Oral)  Resp 18  Ht 5\' 8"  (1.727 m)  Wt 234 lb 12.8 oz (106.505 kg)  BMI 35.71 kg/m2  SpO2 94% Nursing note and vital signs reviewed.  Physical Exam  Constitutional: He is oriented to person, place, and time. He appears well-developed and well-nourished.  HENT:  Head: Normocephalic.  Right Ear: Hearing, tympanic membrane, external ear and ear canal normal.  Left Ear: Hearing, tympanic membrane, external ear and ear canal normal.  Nose: Nose normal.  Mouth/Throat: Uvula is midline, oropharynx is clear and moist and mucous membranes are normal.  Eyes: Conjunctivae and EOM are normal. Pupils are equal, round, and reactive to light.  Neck: Neck supple. No JVD present. No tracheal deviation present. No thyromegaly present.  Cardiovascular: Normal rate, regular rhythm, normal heart sounds and intact distal pulses.   Pulmonary/Chest: Effort normal and breath sounds normal.  Abdominal: Soft. Bowel sounds are normal. He exhibits no distension and no mass. There is no tenderness.  There is no rebound and no guarding.  Musculoskeletal: Normal range of motion. He exhibits no edema or tenderness.  Lymphadenopathy:    He has no cervical adenopathy.  Neurological: He is alert and oriented to person, place, and time. He has normal reflexes. No cranial nerve deficit. He exhibits normal muscle tone. Coordination normal.  Skin: Skin is warm and dry.  Psychiatric: He has a normal mood and affect. His behavior is normal. Judgment and thought content normal.       Assessment & Plan:   Problem List Items Addressed This Visit      Genitourinary   CHRONIC PROSTATITIS   Relevant Orders   Ambulatory referral to Urology     Other   Routine general medical examination at a health care facility - Primary    1) Anticipatory Guidance: Discussed importance of wearing a seatbelt while driving and not texting while driving; changing batteries in smoke detector at least once annually; wearing suntan lotion when outside; eating a balanced and moderate diet; getting physical activity at least  30 minutes per day.  2) Immunizations / Screenings / Labs:  Due for Pneumovax and tetanus at next office visit. All other immunizations are up to date per recommendations. Obtain PSA for prostate screening. Obtain A1c for diabetes screen. All other screenings are up to date per recommendation. Obtain CBC, CMET, Lipid profile and TSH.   Overall well exam. Risk factors for cardiovascular disease include diabetes, hyperlipidemia, hypertension, obesity, sedentary lifestyle and poor nutrition. Chronic conditions currently managed with medications. Discussed importance of nutrition that provides variety and is moderate. Recommend low carbohydrate secondary to diabetes. Encouraged nutrient dense foods and decreased saturated fats with reduction of empty calories and processed foods. Increase physical activity to 30 minutes daily. Recommended weight loss of 5-10% of current body weight. Continue previously  prescribed medications. Follow up office visit pending blood work to review chronic conditions. Follow up prevention exam in 1 year.        Relevant Orders   CBC (Completed)   Comprehensive metabolic panel (Completed)   Lipid panel (Completed)   PSA (Completed)   TSH (Completed)   Hemoglobin A1c (Completed)

## 2015-01-24 NOTE — Patient Instructions (Addendum)
Thank you for choosing [facility].  Summary/Instructions:  Please stop by the lab on the basement level of the building for your blood work. Your results will be released to [place] (or called to you) after review, usually within 72 hours after test completion. If any changes need to be made, you will be notified at that same time.  If your symptoms worsen or fail to improve, please contact our office for further instruction, or in case of emergency go directly to the emergency room at the closest medical facility.   Health Maintenance, Male A healthy lifestyle and preventative care can promote health and wellness.  Maintain regular health, dental, and eye exams.  Eat a healthy diet. Foods like vegetables, fruits, whole grains, low-fat dairy products, and lean protein foods contain the nutrients you need and are low in calories. Decrease your intake of foods high in solid fats, added sugars, and salt. Get information about a proper diet from your health care provider, if necessary.  Regular physical exercise is one of the most important things you can do for your health. Most adults should get at least 150 minutes of moderate-intensity exercise (any activity that increases your heart rate and causes you to sweat) each week. In addition, most adults need muscle-strengthening exercises on 2 or more days a week.   Maintain a healthy weight. The body mass index (BMI) is a screening tool to identify possible weight problems. It provides an estimate of body fat based on height and weight. Your health care provider can find your BMI and can help you achieve or maintain a healthy weight. For males 20 years and older:  A BMI below 18.5 is considered underweight.  A BMI of 18.5 to 24.9 is normal.  A BMI of 25 to 29.9 is considered overweight.  A BMI of 30 and above is considered obese.  Maintain normal blood lipids and cholesterol by exercising and minimizing your intake of saturated fat.  Eat a balanced diet with plenty of fruits and vegetables. Blood tests for lipids and cholesterol should begin at age 72 and be repeated every 5 years. If your lipid or cholesterol levels are high, you are over age 71, or you are at high risk for heart disease, you may need your cholesterol levels checked more frequently.Ongoing high lipid and cholesterol levels should be treated with medicines if diet and exercise are not working.  If you smoke, find out from your health care provider how to quit. If you do not use tobacco, do not start.  Lung cancer screening is recommended for adults aged 55-80 years who are at high risk for developing lung cancer because of a history of smoking. A yearly low-dose CT scan of the lungs is recommended for people who have at least a 30-pack-year history of smoking and are current smokers or have quit within the past 15 years. A pack year of smoking is smoking an average of 1 pack of cigarettes a day for 1 year (for example, a 30-pack-year history of smoking could mean smoking 1 pack a day for 30 years or 2 packs a day for 15 years). Yearly screening should continue until the smoker has stopped smoking for at least 15 years. Yearly screening should be stopped for people who develop a health problem that would prevent them from having lung cancer treatment.  If you choose to drink alcohol, do not have more than 2 drinks per day. One drink is considered to be 12 oz (360 mL) of  beer, 5 oz (150 mL) of wine, or 1.5 oz (45 mL) of liquor.  Avoid the use of street drugs. Do not share needles with anyone. Ask for help if you need support or instructions about stopping the use of drugs.  High blood pressure causes heart disease and increases the risk of stroke. High blood pressure is more likely to develop in:  People who have blood pressure in the end of the normal range (100-139/85-89 mm Hg).  People who are overweight or obese.  People who are African American.  If you are  59-28 years of age, have your blood pressure checked every 3-5 years. If you are 58 years of age or older, have your blood pressure checked every year. You should have your blood pressure measured twice--once when you are at a hospital or clinic, and once when you are not at a hospital or clinic. Record the average of the two measurements. To check your blood pressure when you are not at a hospital or clinic, you can use:  An automated blood pressure machine at a pharmacy.  A home blood pressure monitor.  If you are 47-62 years old, ask your health care provider if you should take aspirin to prevent heart disease.  Diabetes screening involves taking a blood sample to check your fasting blood sugar level. This should be done once every 3 years after age 24 if you are at a normal weight and without risk factors for diabetes. Testing should be considered at a younger age or be carried out more frequently if you are overweight and have at least 1 risk factor for diabetes.  Colorectal cancer can be detected and often prevented. Most routine colorectal cancer screening begins at the age of 57 and continues through age 48. However, your health care provider may recommend screening at an earlier age if you have risk factors for colon cancer. On a yearly basis, your health care provider may provide home test kits to check for hidden blood in the stool. A small camera at the end of a tube may be used to directly examine the colon (sigmoidoscopy or colonoscopy) to detect the earliest forms of colorectal cancer. Talk to your health care provider about this at age 70 when routine screening begins. A direct exam of the colon should be repeated every 5-10 years through age 70, unless early forms of precancerous polyps or small growths are found.  People who are at an increased risk for hepatitis B should be screened for this virus. You are considered at high risk for hepatitis B if:  You were born in a country where  hepatitis B occurs often. Talk with your health care provider about which countries are considered high risk.  Your parents were born in a high-risk country and you have not received a shot to protect against hepatitis B (hepatitis B vaccine).  You have HIV or AIDS.  You use needles to inject street drugs.  You live with, or have sex with, someone who has hepatitis B.  You are a man who has sex with other men (MSM).  You get hemodialysis treatment.  You take certain medicines for conditions like cancer, organ transplantation, and autoimmune conditions.  Hepatitis C blood testing is recommended for all people born from [birth year] through 1963 and any individual with known risk factors for hepatitis C.  Healthy men should no longer receive prostate-specific antigen (PSA) blood tests as part of routine cancer screening. Talk to your health care provider about prostate  cancer screening.  Testicular cancer screening is not recommended for adolescents or adult males who have no symptoms. Screening includes self-exam, a health care provider exam, and other screening tests. Consult with your health care provider about any symptoms you have or any concerns you have about testicular cancer.  Practice safe sex. Use condoms and avoid high-risk sexual practices to reduce the spread of sexually transmitted infections (STIs).  You should be screened for STIs, including gonorrhea and chlamydia if:  You are sexually active and are younger than 24 years.  You are older than 24 years, and your health care provider tells you that you are at risk for this type of infection.  Your sexual activity has changed since you were last screened, and you are at an increased risk for chlamydia or gonorrhea. Ask your health care provider if you are at risk.  If you are at risk of being infected with HIV, it is recommended that you take a prescription medicine daily to prevent HIV infection. This is called pre-exposure  prophylaxis (PrEP). You are considered at risk if:  You are a man who has sex with other men (MSM).  You are a heterosexual man who is sexually active with multiple partners.  You take drugs by injection.  You are sexually active with a partner who has HIV.  Talk with your health care provider about whether you are at high risk of being infected with HIV. If you choose to begin PrEP, you should first be tested for HIV. You should then be tested every 3 months for as long as you are taking PrEP.  Use sunscreen. Apply sunscreen liberally and repeatedly throughout the day. You should seek shade when your shadow is shorter than you. Protect yourself by wearing long sleeves, pants, a wide-brimmed hat, and sunglasses year round whenever you are outdoors.  Tell your health care provider of new moles or changes in moles, especially if there is a change in shape or color. Also, tell your health care provider if a mole is larger than the size of a pencil eraser.  A one-time screening for abdominal aortic aneurysm (AAA) and surgical repair of large AAAs by ultrasound is recommended for men aged 76-75 years who are current or former smokers.  Stay current with your vaccines (immunizations).   This information is not intended to replace advice given to you by your health care provider. Make sure you discuss any questions you have with your health care provider.   Document Released: 09/05/2007 Document Revised: 03/30/2014 Document Reviewed: 08/04/2010 Elsevier Interactive Patient Education 2016 [employer].  Diabetes and Exercise Exercising regularly is important. It is not just about losing weight. It has many health benefits, such as:  Improving your overall fitness, flexibility, and endurance.  Increasing your bone density.  Helping with weight control.  Decreasing your body fat.  Increasing your muscle strength.  Reducing stress and tension.  Improving your overall health. People  with diabetes who exercise gain additional benefits because exercise:  Reduces appetite.  Improves the body's use of blood sugar (glucose).  Helps lower or control blood glucose.  Decreases blood pressure.  Helps control blood lipids (such as cholesterol and triglycerides).  Improves the body's use of the hormone insulin by:  Increasing the body's insulin sensitivity.  Reducing the body's insulin needs.  Decreases the risk for heart disease because exercising:  Lowers cholesterol and triglycerides levels.  Increases the levels of good cholesterol (such as high-density lipoproteins [HDL]) in the body.  Lowers blood glucose levels. YOUR ACTIVITY PLAN  Choose an activity that you enjoy, and set realistic goals. To exercise safely, you should begin practicing any new physical activity slowly, and gradually increase the intensity of the exercise over time. Your health care provider or diabetes educator can help create an activity plan that works for you. General recommendations include:  Encouraging children to engage in at least 60 minutes of physical activity each day.  Stretching and performing strength training exercises, such as yoga or weight lifting, at least 2 times per week.  Performing a total of at least 150 minutes of moderate-intensity exercise each week, such as brisk walking or water aerobics.  Exercising at least 3 days per week, making sure you allow no more than 2 consecutive days to pass without exercising.  Avoiding long periods of inactivity (90 minutes or more). When you have to spend an extended period of time sitting down, take frequent breaks to walk or stretch. RECOMMENDATIONS FOR EXERCISING WITH TYPE 1 OR TYPE 2 DIABETES   Check your blood glucose before exercising. If blood glucose levels are greater than 240 mg/dL, check for urine ketones. Do not exercise if ketones are present.  Avoid injecting insulin into areas of the body that are going to be  exercised. For example, avoid injecting insulin into:  The arms when playing tennis.  The legs when jogging.  Keep a record of:  Food intake before and after you exercise.  Expected peak times of insulin action.  Blood glucose levels before and after you exercise.  The type and amount of exercise you have done.  Review your records with your health care provider. Your health care provider will help you to develop guidelines for adjusting food intake and insulin amounts before and after exercising.  If you take insulin or oral hypoglycemic agents, watch for signs and symptoms of hypoglycemia. They include:  Dizziness.  Shaking.  Sweating.  Chills.  Confusion.  Drink plenty of water while you exercise to prevent dehydration or heat stroke. Body water is lost during exercise and must be replaced.  Talk to your health care provider before starting an exercise program to make sure it is safe for you. Remember, almost any type of activity is better than none.   This information is not intended to replace advice given to you by your health care provider. Make sure you discuss any questions you have with your health care provider.   Document Released: 05/30/2003 Document Revised: 07/24/2014 Document Reviewed: 08/16/2012 Elsevier Interactive Patient Education [employer].

## 2015-01-24 NOTE — Progress Notes (Signed)
Pre visit review using our clinic review tool, if applicable. No additional management support is needed unless otherwise documented below in the visit note. 

## 2015-01-25 ENCOUNTER — Ambulatory Visit (INDEPENDENT_AMBULATORY_CARE_PROVIDER_SITE_OTHER): Payer: BC Managed Care – PPO

## 2015-01-25 DIAGNOSIS — Z23 Encounter for immunization: Secondary | ICD-10-CM | POA: Diagnosis not present

## 2015-01-25 NOTE — Telephone Encounter (Signed)
Pt aware of results 

## 2015-02-06 ENCOUNTER — Encounter: Payer: Self-pay | Admitting: Family

## 2015-02-10 ENCOUNTER — Other Ambulatory Visit: Payer: Self-pay | Admitting: Family

## 2015-03-26 ENCOUNTER — Other Ambulatory Visit: Payer: Self-pay | Admitting: Family

## 2015-04-25 ENCOUNTER — Ambulatory Visit (INDEPENDENT_AMBULATORY_CARE_PROVIDER_SITE_OTHER): Payer: BC Managed Care – PPO | Admitting: Family

## 2015-04-25 ENCOUNTER — Other Ambulatory Visit (INDEPENDENT_AMBULATORY_CARE_PROVIDER_SITE_OTHER): Payer: BC Managed Care – PPO

## 2015-04-25 ENCOUNTER — Encounter: Payer: Self-pay | Admitting: Family

## 2015-04-25 VITALS — BP 122/78 | HR 72 | Temp 98.0°F | Resp 16 | Ht 68.0 in | Wt 234.0 lb

## 2015-04-25 DIAGNOSIS — E118 Type 2 diabetes mellitus with unspecified complications: Secondary | ICD-10-CM

## 2015-04-25 DIAGNOSIS — E785 Hyperlipidemia, unspecified: Secondary | ICD-10-CM | POA: Diagnosis not present

## 2015-04-25 LAB — LIPID PANEL
CHOL/HDL RATIO: 5
CHOLESTEROL: 122 mg/dL (ref 0–200)
HDL: 26.6 mg/dL — ABNORMAL LOW (ref >39.00)
Triglycerides: 706 mg/dL — ABNORMAL HIGH (ref 0.0–149.0)

## 2015-04-25 LAB — MICROALBUMIN / CREATININE URINE RATIO
CREATININE, U: 251.1 mg/dL
MICROALB UR: 1.8 mg/dL (ref 0.0–1.9)
MICROALB/CREAT RATIO: 0.7 mg/g (ref 0.0–30.0)

## 2015-04-25 LAB — LDL CHOLESTEROL, DIRECT: Direct LDL: 56 mg/dL

## 2015-04-25 LAB — HEMOGLOBIN A1C: HEMOGLOBIN A1C: 7.2 % — AB (ref 4.6–6.5)

## 2015-04-25 NOTE — Progress Notes (Signed)
Pre visit review using our clinic review tool, if applicable. No additional management support is needed unless otherwise documented below in the visit note. 

## 2015-04-25 NOTE — Patient Instructions (Signed)
Thank you for choosing [facility].  Summary/Instructions:  Your prescription(s) have been submitted to your pharmacy or been printed and provided for you. Please take as directed and contact our office if you believe you are having problem(s) with the medication(s) or have any questions.  Please stop by the lab on the basement level of the building for your blood work. Your results will be released to [place] (or called to you) after review, usually within 72 hours after test completion. If any changes need to be made, you will be notified at that same time.  Please continue to take her medications as prescribed.  Please work to reduce the sugary and processed food intake and increase fruits, vegetables, and whole grains.  We will check your A1c and cholesterol today and determine continued use of medications as needed.  Follow-up will be pending your blood work.

## 2015-04-25 NOTE — Assessment & Plan Note (Addendum)
Hyperlipidemia appears labile with current dosage of simvastatin. Noted to have hypertriglyceridemia on previous blood work. Obtain new lipid profile to confirm. Continue current dosage of simvastatin pending lipid profile. Discussed and answered questions regarding nutrition with encouragement for reduction and sugary and processed foods and increase in nutrient dense foods. Also encouraged physical activity 30 minutes daily of moderate level intensity. Consider starting fenofibrate if triglycerides remain elevated.

## 2015-04-25 NOTE — Progress Notes (Signed)
Subjective:    Patient ID: Austin Valencia., male    DOB: 1965/05/21, 50 y.o.   MRN: [id number]  Chief Complaint  Patient presents with  . Follow-up    had lab work done when he was last seen in november and wants to go over that.    HPI:  Austin Valencia. is a 50 y.o. male who  has a past medical history of Hypertension; GERD (gastroesophageal reflux disease); Hyperlipidemia; Diabetes mellitus without complication ([place]); and [id number]). and presents today for a follow-up office visit.  1.) Diabetes - currently maintained on glimepiride. Takes medication as prescribed and denies adverse side effects. No hypoglycemic readings. Due for eye exam, urine microalbumin, and foot exam. Home blood sugars have been slightly elevated between 170-200. Denies symptoms of end organ damage.   Lab Results  Component Value Date   HGBA1C 6.4 01/24/2015   2.) Hyperlipidemia - currently maintained on simvastatin. Previous blood work shows elevated triglyceride levels. Takes medication as prescribed and denies adverse side effects or myalgias.  Lab Results  Component Value Date   CHOL 129 01/24/2015   HDL 27.70* 01/24/2015   LDLCALC 68 05/13/2010   LDLDIRECT 69.0 01/24/2015   TRIG * 01/24/2015    446.0 Triglyceride is over 400; calculations on Lipids are invalid.   CHOLHDL 5 01/24/2015    Allergies  Allergen Reactions  . 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 Ace Inhibitors     See 05/23/14 angioedema with Losartan     Current Outpatient Prescriptions on File Prior to Visit  Medication Sig Dispense Refill  . amLODipine (NORVASC) 5 MG tablet Take 5 mg by mouth daily.    Marland Kitchen doxazosin (CARDURA) 4 MG tablet Take 1 tablet (4 mg total) by mouth daily. 90 tablet 1  . glimepiride (AMARYL) 4 MG tablet TAKE ONE TABLET BY MOUTH ONCE DAILY FOR  BREAKFAST 30 tablet 2  . omeprazole (PRILOSEC) 20 MG capsule Take 20 mg by mouth daily.    . simvastatin (ZOCOR) 10 MG tablet Take 1 tablet (10  mg total) by mouth at bedtime. 30 tablet 3   No current facility-administered medications on file prior to visit.     Review of Systems  Eyes:       Negative for changes in vision.   Respiratory: Negative for chest tightness and shortness of breath.   Cardiovascular: Negative for chest pain, palpitations and leg swelling.  Neurological: Negative for weakness, numbness and headaches.      Objective:    BP 122/78 mmHg  Pulse 72  Temp(Src) 98 F (36.7 C) (Oral)  Resp 16  Ht 5\' 8"  (1.727 m)  Wt 234 lb (106.142 kg)  BMI 35.59 kg/m2  SpO2 95% Nursing note and vital signs reviewed.  Physical Exam  Constitutional: He is oriented to person, place, and time. He appears well-developed and well-nourished. No distress.  Cardiovascular: Normal rate, regular rhythm, normal heart sounds and intact distal pulses.   Pulmonary/Chest: Effort normal and breath sounds normal.  Neurological: He is alert and oriented to person, place, and time.  Diabetic Foot Exam - Simple   Simple Foot Form  Visual Inspection  No deformities, no ulcerations, no other skin breakdown bilaterally:  Yes  Sensation Testing  Intact to touch and monofilament testing bilaterally:  Yes  Pulse Check  Posterior Tibialis and Dorsalis pulse intact bilaterally:  Yes  Comments      Skin: Skin is warm and dry.  Psychiatric: He  has a normal mood and affect. His behavior is normal. Judgment and thought content normal.       Assessment & Plan:   Problem List Items Addressed This Visit      Endocrine   Type II diabetes mellitus with complication ([place]) - Primary    Type 2 diabetes with previous A1c of 6.4. Reports not having taken the Januvia since the prescription ran out. Continues to take Amaryl without side effects. Obtain A1c and urine microalbumin. Foot exam completed today. Diabetic eye exam encouraged to be completed independently. Maintained on simvastatin for CAD risk reduction. Intolerant to Ace inhibitors and  angiotensin receptor blockers. Pneumovax is up-to-date. Continue current dosage of Amaryl. Follow-up pending A1c results.      Relevant Orders   Hemoglobin A1c   Urine Microalbumin w/creat. ratio     Other   Hyperlipidemia    Hyperlipidemia appears labile with current dosage of simvastatin. Noted to have hypertriglyceridemia on previous blood work. Obtain new lipid profile to confirm. Continue current dosage of simvastatin pending lipid profile. Discussed and answered questions regarding nutrition with encouragement for reduction and sugary and processed foods and increase in nutrient dense foods. Also encouraged physical activity 30 minutes daily of moderate level intensity. Consider starting fenofibrate if triglycerides remain elevated.      Relevant Orders   Lipid Profile

## 2015-04-25 NOTE — Assessment & Plan Note (Signed)
Type 2 diabetes with previous A1c of 6.4. Reports not having taken the Januvia since the prescription ran out. Continues to take Amaryl without side effects. Obtain A1c and urine microalbumin. Foot exam completed today. Diabetic eye exam encouraged to be completed independently. Maintained on simvastatin for CAD risk reduction. Intolerant to Ace inhibitors and angiotensin receptor blockers. Pneumovax is up-to-date. Continue current dosage of Amaryl. Follow-up pending A1c results.

## 2015-04-29 ENCOUNTER — Telehealth: Payer: Self-pay | Admitting: Family

## 2015-04-29 DIAGNOSIS — E119 Type 2 diabetes mellitus without complications: Secondary | ICD-10-CM

## 2015-04-29 DIAGNOSIS — Z0100 Encounter for examination of eyes and vision without abnormal findings: Principal | ICD-10-CM

## 2015-04-29 MED ORDER — FENOFIBRATE 145 MG PO TABS: 145.0000 mg | ORAL_TABLET | Freq: Every day | ORAL | Status: DC

## 2015-04-29 NOTE — Telephone Encounter (Signed)
Please inform patient that his kidney function from the urine sample looks good. His A1c is 7.2 which is increased from previous. I would like him to continue to take the glimepiride at the current dosage and work on nutrition and physical activity. Lastly his triglycerides are significantly elevated and therefore I have sent a medication for him to start called fenofibrate. I would like him to follow up in 3 months.

## 2015-05-01 NOTE — Telephone Encounter (Signed)
Pt aware of results 

## 2015-05-27 ENCOUNTER — Other Ambulatory Visit: Payer: Self-pay | Admitting: Family

## 2015-05-30 ENCOUNTER — Encounter: Payer: Self-pay | Admitting: Family

## 2015-05-30 LAB — HM DIABETES EYE EXAM

## 2015-06-22 ENCOUNTER — Ambulatory Visit (INDEPENDENT_AMBULATORY_CARE_PROVIDER_SITE_OTHER): Payer: BC Managed Care – PPO | Admitting: Family Medicine

## 2015-06-22 VITALS — BP 100/60 | HR 81 | Temp 98.0°F | Resp 20 | Ht 67.5 in | Wt 216.5 lb

## 2015-06-22 DIAGNOSIS — E1165 Type 2 diabetes mellitus with hyperglycemia: Secondary | ICD-10-CM | POA: Diagnosis not present

## 2015-06-22 DIAGNOSIS — IMO0001 Reserved for inherently not codable concepts without codable children: Secondary | ICD-10-CM

## 2015-06-22 DIAGNOSIS — N183 Chronic kidney disease, stage 3 (moderate): Secondary | ICD-10-CM

## 2015-06-22 DIAGNOSIS — E1122 Type 2 diabetes mellitus with diabetic chronic kidney disease: Secondary | ICD-10-CM | POA: Insufficient documentation

## 2015-06-22 LAB — POCT URINALYSIS DIP (MANUAL ENTRY)
Bilirubin, UA: NEGATIVE
Blood, UA: NEGATIVE
Glucose, UA: 500 — AB
Ketones, POC UA: NEGATIVE
Leukocytes, UA: NEGATIVE
Nitrite, UA: NEGATIVE
Protein Ur, POC: NEGATIVE
Spec Grav, UA: 1.01
Urobilinogen, UA: 1
pH, UA: 5.5

## 2015-06-22 LAB — COMPLETE METABOLIC PANEL WITH GFR
ALT: 23 U/L (ref 9–46)
AST: 15 U/L (ref 10–40)
Albumin: 4.6 g/dL (ref 3.6–5.1)
Alkaline Phosphatase: 93 U/L (ref 40–115)
BUN: 13 mg/dL (ref 7–25)
CO2: 27 mmol/L (ref 20–31)
Calcium: 9.7 mg/dL (ref 8.6–10.3)
Chloride: 100 mmol/L (ref 98–110)
Creat: 1.55 mg/dL — ABNORMAL HIGH (ref 0.60–1.35)
GFR, Est African American: 61 mL/min (ref >=60)
GFR, Est Non African American: 53 mL/min — ABNORMAL LOW (ref >=60)
Glucose, Bld: 362 mg/dL — ABNORMAL HIGH (ref 65–99)
Potassium: 4.7 mmol/L (ref 3.5–5.3)
Sodium: 136 mmol/L (ref 135–146)
Total Bilirubin: 0.7 mg/dL (ref 0.2–1.2)
Total Protein: 7 g/dL (ref 6.1–8.1)

## 2015-06-22 LAB — GLUCOSE, POCT (MANUAL RESULT ENTRY): POC Glucose: 393 mg/dl — AB (ref 70–99)

## 2015-06-22 LAB — POCT GLYCOSYLATED HEMOGLOBIN (HGB A1C): Hemoglobin A1C: 12.9

## 2015-06-22 MED ORDER — DAPAGLIFLOZIN PRO-METFORMIN ER 5-1000 MG PO TB24: 5.0000 mg | ORAL_TABLET | Freq: Every morning | ORAL | Status: DC

## 2015-06-22 MED ORDER — INSULIN GLARGINE 100 UNITS/ML SOLOSTAR PEN
30.0000 [IU] | PEN_INJECTOR | Freq: Once | SUBCUTANEOUS | Status: AC
  Administered 2015-06-22: 30 [IU] via SUBCUTANEOUS

## 2015-06-22 NOTE — Patient Instructions (Signed)
Take the new diabetes medicine daily.  Xigduo XR Let's recheck your sugar in one week.

## 2015-06-22 NOTE — Progress Notes (Signed)
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 Has a two-year history of diabetes for which she has taken Amaryl.   He also has hypertension and takes doxazosin and amlodipine for that.  He has hyperlipidemia for which he takes simvastatin and fenofibrate.  Last couple weeks patient's had an increase in his urination associated with weight loss and blurry vision. About a month ago he had his eyes examined by a ophthalmologist and was found to have no acute problems in his retina. Patient denies numbness or tingling in his hands and feet. He's noticed that he's lost over 10 pounds in the last month.  Patient has had about a week of vague left-sided abdominal pain. He still having bowel movements. He's had no fever.  Objective: Middle-aged African-American individual in no acute distress HEENT: Unremarkable with normal oropharynx, moist mucous membranes. Neck: Supple no adenopathy or thyromegaly Chest: Clear to auscultation Heart: Regular, no murmur Abdomen: Soft minimally tender in the left midabdomen, no masses, no HSM, no rebound or guarding Skin: No rashes or skin breaks, no diabetic callus BP 100/60 mmHg  Pulse 81  Temp(Src) 98 F (36.7 C) (Oral)  Resp 20  Ht 5' 7.5" (1.715 m)  Wt 216 lb 8 oz (98.204 kg)  BMI 33.39 kg/m2  SpO2 96% Wt Readings from Last 3 Encounters:  06/22/15 216 lb 8 oz (98.204 kg)  04/25/15 234 lb (106.142 kg)  01/24/15 234 lb 12.8 oz (106.505 kg)   Results for orders placed or performed in visit on 06/22/15  POCT urinalysis dipstick  Result Value Ref Range   Color, UA yellow yellow   Clarity, UA clear clear   Glucose, UA =500 (A) negative   Bilirubin, UA negative negative   Ketones, POC UA negative negative   Spec Grav, UA 1.010    Blood, UA negative negative   pH, UA 5.5    Protein Ur, POC negative negative   Urobilinogen, UA 1.0    Nitrite, UA Negative Negative   Leukocytes, UA Negative Negative  POCT glucose (manual entry)  Result Value Ref  Range   POC Glucose 393 (A) 70 - 99 mg/dl  POCT glycosylated hemoglobin (Hb A1C)  Result Value Ref Range   Hemoglobin A1C 12.9      Assessment: Uncontrolled diabetes with vague left-sided abdominal pain which is probably due to the hyperglycemia.  Rechck one week Uncontrolled type 2 diabetes mellitus without complication, without long-term current use of insulin ([place]) - Plan: POCT urinalysis dipstick, POCT glucose (manual entry), POCT glycosylated hemoglobin (Hb A1C), COMPLETE METABOLIC PANEL WITH GFR, Dapagliflozin-Metformin HCl ER (XIGDUO XR) 07-998 MG TB24, insulin glargine (LANTUS) Solostar Pen 30 Units  Robyn Haber, MD

## 2015-06-29 ENCOUNTER — Ambulatory Visit (INDEPENDENT_AMBULATORY_CARE_PROVIDER_SITE_OTHER): Payer: BC Managed Care – PPO | Admitting: Family Medicine

## 2015-06-29 VITALS — BP 102/64 | HR 75 | Temp 98.1°F | Resp 16 | Ht 68.0 in | Wt 218.0 lb

## 2015-06-29 DIAGNOSIS — IMO0001 Reserved for inherently not codable concepts without codable children: Secondary | ICD-10-CM

## 2015-06-29 DIAGNOSIS — E1165 Type 2 diabetes mellitus with hyperglycemia: Secondary | ICD-10-CM | POA: Diagnosis not present

## 2015-06-29 NOTE — Patient Instructions (Signed)
     IF you received an x-ray today, you will receive an invoice from [facility]. Please contact [facility] at [phone] with questions or concerns regarding your invoice.   IF you received labwork today, you will receive an invoice from [facility]. Please contact [facility] at [phone] with questions or concerns regarding your invoice.   Our billing staff will not be able to assist you with questions regarding bills from these companies.  You will be contacted with the lab results as soon as they are available. The fastest way to get your results is to activate your My Chart account. Instructions are located on the last page of this paperwork. If you have not heard from us regarding the results in 2 weeks, please contact this office.      

## 2015-06-29 NOTE — Progress Notes (Signed)
This 50 year old gentleman is come in for recheck on his diabetes. You seen recently with a blood sugar over 400. He was given a glucometer and told to check his sugar.  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  He was also changed from glimepiride to xiduo xr. His blood sugars now are running in the 200-250 range and is feeling much better with chest symptoms resolved and polyuria diminished. He still has some nocturia. His vision is improving.  Objective:BP 102/64 mmHg  Pulse 75  Temp(Src) 98.1 F (36.7 C) (Oral)  Resp 16  Ht 5\' 8"  (1.727 m)  Wt 218 lb (98.884 kg)  BMI 33.15 kg/m2  SpO2 97% Patient checked his blood sugar this morning was 250. I spent 40 minutes with the patient discussing the importance of exercise and diet. 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  I've asked him to limit soft drinks 22 beverages a day or less and make sure he has fresh fruits and fresh vegetables at every meal. We reviewed the foods to avoid such as high carbohydrate and high fructose containing foods.  Plan: Return in one month and continue monitoring blood sugar. Work on exercise and diet Signed, Carola Frost.D.

## 2015-07-15 ENCOUNTER — Ambulatory Visit (INDEPENDENT_AMBULATORY_CARE_PROVIDER_SITE_OTHER): Payer: BC Managed Care – PPO | Admitting: Family

## 2015-07-15 ENCOUNTER — Encounter: Payer: Self-pay | Admitting: Family

## 2015-07-15 VITALS — BP 120/64 | HR 77 | Temp 97.7°F | Resp 16 | Ht 67.5 in | Wt 221.0 lb

## 2015-07-15 DIAGNOSIS — E1165 Type 2 diabetes mellitus with hyperglycemia: Secondary | ICD-10-CM

## 2015-07-15 DIAGNOSIS — IMO0001 Reserved for inherently not codable concepts without codable children: Secondary | ICD-10-CM

## 2015-07-15 MED ORDER — AMLODIPINE BESYLATE 5 MG PO TABS: 5.0000 mg | ORAL_TABLET | Freq: Every day | ORAL | Status: DC

## 2015-07-15 NOTE — Assessment & Plan Note (Signed)
Blood sugars with improved control with the current regimen. Foot exam completed today. Maintained on a statin for CAD risk reduction. Intolerant to ACE/ARBs. Pneumovax is up to date. Continue to monitor blood sugars every other morning. Eye exam is up to date. Continue current dosage of Xigduo. Follow up in 2 months for A1c. Continue with lifestyle management.

## 2015-07-15 NOTE — Progress Notes (Signed)
Subjective:    Patient ID: Austin Valencia., male    DOB: 1965-08-11, 50 y.o.   MRN: [id number]  Chief Complaint  Patient presents with  . Follow-up    had lab work done at [facility], A1c was high, needed to follow up    HPI:  Austin Valencia. is a 50 y.o. male who  has a past medical history of Hypertension; GERD (gastroesophageal reflux disease); Hyperlipidemia; Diabetes mellitus without complication ([place]); and [id number]). and presents today for a follow up office visit.  Type 2 diabetes - Currently managed on Xigduo. Recently seen at [facility] and was changed from glimepiride with improvement in his blood sugars with encouragement to decrease sugary intake and improve fruits and vegetable intake. At the time he was complaining of frequent urination, increased hunger and fatigue. Since starting the [facility], he reports feeling better with no adverse symptoms. Reports taking his blood sugars once per week.     Lab Results  Component Value Date   HGBA1C 12.9 06/22/2015    Allergies  Allergen Reactions  . 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 Ace Inhibitors     See 05/23/14 angioedema with Losartan     Current Outpatient Prescriptions on File Prior to Visit  Medication Sig Dispense Refill  . Dapagliflozin-Metformin HCl ER (XIGDUO XR) 07-998 MG TB24 Take 5 mg by mouth every morning. 30 tablet 3  . doxazosin (CARDURA) 4 MG tablet TAKE ONE TABLET BY MOUTH ONCE DAILY 90 tablet 1  . fenofibrate (TRICOR) 145 MG tablet Take 1 tablet (145 mg total) by mouth daily. 30 tablet 2  . omeprazole (PRILOSEC) 20 MG capsule Take 20 mg by mouth daily.    . simvastatin (ZOCOR) 10 MG tablet Take 1 tablet (10 mg total) by mouth at bedtime. 30 tablet 3   No current facility-administered medications on file prior to visit.    Review of Systems  Constitutional: Negative for fever and chills.  Respiratory: Negative for chest tightness and shortness of breath.   Cardiovascular: Negative for  chest pain, palpitations and leg swelling.  Neurological: Negative for dizziness, weakness and headaches.      Objective:    BP 120/64 mmHg  Pulse 77  Temp(Src) 97.7 F (36.5 C) (Oral)  Resp 16  Ht 5' 7.5" (1.715 m)  Wt 221 lb (100.245 kg)  BMI 34.08 kg/m2  SpO2 97% Nursing note and vital signs reviewed.  Physical Exam  Constitutional: He is oriented to person, place, and time. He appears well-developed and well-nourished. No distress.  Cardiovascular: Normal rate, regular rhythm, normal heart sounds and intact distal pulses.   Pulmonary/Chest: Effort normal and breath sounds normal.  Neurological: He is alert and oriented to person, place, and time.  Diabetic foot exam - bilateral feet are free from skin breakdown, cuts, and abrasions. Toenails are neatly trimmed. Pulses are intact and appropriate. Sensation is intact to monofilament bilaterally.  Skin: Skin is warm and dry.  Psychiatric: He has a normal mood and affect. His behavior is normal. Judgment and thought content normal.       Assessment & Plan:   Problem List Items Addressed This Visit      Endocrine   Uncontrolled type 2 diabetes mellitus without complication, without long-term current use of insulin ([place]) - Primary    Blood sugars with improved control with the current regimen. Foot exam completed today. Maintained on a statin for CAD risk reduction. Intolerant to ACE/ARBs. Pneumovax is up to  date. Continue to monitor blood sugars every other morning. Eye exam is up to date. Continue current dosage of Xigduo. Follow up in 2 months for A1c. Continue with lifestyle management.           I have changed Mr. Austin Valencia's amLODipine. I am also having him maintain his omeprazole, simvastatin, fenofibrate, doxazosin, and Dapagliflozin-Metformin HCl ER.   Meds ordered this encounter  Medications  . amLODipine (NORVASC) 5 MG tablet    Sig: Take 1 tablet (5 mg total) by mouth daily.    Dispense:  90 tablet    Refill:   1    Order Specific Question:  Supervising Provider    Answer:  Pricilla Holm A [id number]     Follow-up: Return in about 2 months (around 09/14/2015).  Mauricio Po, FNP

## 2015-07-15 NOTE — Patient Instructions (Addendum)
Thank you for choosing [facility].  Summary/Instructions:  Please continue to take the Xigduo as prescribed.   Monitor your blood sugars in the morning every other day.  Continue to take your medications as prescribed.   If your symptoms worsen or fail to improve, please contact our office for further instruction, or in case of emergency go directly to the emergency room at the closest medical facility.   Diabetes and Exercise Exercising regularly is important. It is not just about losing weight. It has many health benefits, such as:  Improving your overall fitness, flexibility, and endurance.  Increasing your bone density.  Helping with weight control.  Decreasing your body fat.  Increasing your muscle strength.  Reducing stress and tension.  Improving your overall health. People with diabetes who exercise gain additional benefits because exercise:  Reduces appetite.  Improves the body's use of blood sugar (glucose).  Helps lower or control blood glucose.  Decreases blood pressure.  Helps control blood lipids (such as cholesterol and triglycerides).  Improves the body's use of the hormone insulin by:  Increasing the body's insulin sensitivity.  Reducing the body's insulin needs.  Decreases the risk for heart disease because exercising:  Lowers cholesterol and triglycerides levels.  Increases the levels of good cholesterol (such as high-density lipoproteins [HDL]) in the body.  Lowers blood glucose levels. YOUR ACTIVITY PLAN  Choose an activity that you enjoy, and set realistic goals. To exercise safely, you should begin practicing any new physical activity slowly, and gradually increase the intensity of the exercise over time. Your health care provider or diabetes educator can help create an activity plan that works for you. General recommendations include:  Encouraging children to engage in at least 60 minutes of physical activity each  day.  Stretching and performing strength training exercises, such as yoga or weight lifting, at least 2 times per week.  Performing a total of at least 150 minutes of moderate-intensity exercise each week, such as brisk walking or water aerobics.  Exercising at least 3 days per week, making sure you allow no more than 2 consecutive days to pass without exercising.  Avoiding long periods of inactivity (90 minutes or more). When you have to spend an extended period of time sitting down, take frequent breaks to walk or stretch. RECOMMENDATIONS FOR EXERCISING WITH TYPE 1 OR TYPE 2 DIABETES   Check your blood glucose before exercising. If blood glucose levels are greater than 240 mg/dL, check for urine ketones. Do not exercise if ketones are present.  Avoid injecting insulin into areas of the body that are going to be exercised. For example, avoid injecting insulin into:  The arms when playing tennis.  The legs when jogging.  Keep a record of:  Food intake before and after you exercise.  Expected peak times of insulin action.  Blood glucose levels before and after you exercise.  The type and amount of exercise you have done.  Review your records with your health care provider. Your health care provider will help you to develop guidelines for adjusting food intake and insulin amounts before and after exercising.  If you take insulin or oral hypoglycemic agents, watch for signs and symptoms of hypoglycemia. They include:  Dizziness.  Shaking.  Sweating.  Chills.  Confusion.  Drink plenty of water while you exercise to prevent dehydration or heat stroke. Body water is lost during exercise and must be replaced.  Talk to your health care provider before starting an exercise program to make sure  it is safe for you. Remember, almost any type of activity is better than none.   This information is not intended to replace advice given to you by your health care provider. Make sure you  discuss any questions you have with your health care provider.   Document Released: 05/30/2003 Document Revised: 07/24/2014 Document Reviewed: 08/16/2012 Elsevier Interactive Patient Education [employer].

## 2015-07-15 NOTE — Progress Notes (Signed)
Pre visit review using our clinic review tool, if applicable. No additional management support is needed unless otherwise documented below in the visit note. 

## 2015-07-21 ENCOUNTER — Ambulatory Visit (INDEPENDENT_AMBULATORY_CARE_PROVIDER_SITE_OTHER): Payer: BC Managed Care – PPO | Admitting: Family Medicine

## 2015-07-21 ENCOUNTER — Other Ambulatory Visit: Payer: Self-pay | Admitting: Radiology

## 2015-07-21 VITALS — BP 110/72 | HR 75 | Temp 99.2°F | Resp 16 | Ht 67.5 in | Wt 217.0 lb

## 2015-07-21 DIAGNOSIS — R220 Localized swelling, mass and lump, head: Secondary | ICD-10-CM | POA: Diagnosis not present

## 2015-07-21 DIAGNOSIS — E1165 Type 2 diabetes mellitus with hyperglycemia: Secondary | ICD-10-CM | POA: Diagnosis not present

## 2015-07-21 DIAGNOSIS — IMO0001 Reserved for inherently not codable concepts without codable children: Secondary | ICD-10-CM

## 2015-07-21 MED ORDER — MUPIROCIN 2 % EX OINT: 1.0000 "application " | TOPICAL_OINTMENT | Freq: Three times a day (TID) | CUTANEOUS | Status: DC

## 2015-07-21 MED ORDER — DAPAGLIFLOZIN PRO-METFORMIN ER 5-1000 MG PO TB24: 10.0000 mg | ORAL_TABLET | Freq: Every morning | ORAL | Status: DC

## 2015-07-21 NOTE — Progress Notes (Signed)
Subjective:    Patient ID: Austin Valencia., male    DOB: [birth year], 50 y.o.   MRN: [id number] By signing my name below, I, Judithe Modest, attest that this documentation has been prepared under the direction and in the presence of [name], MD. Electronically Signed: Judithe Modest, ER Scribe. 07/21/2015. 2:25 PM.  Chief Complaint  Patient presents with  . lump on head    On left side of head, painful - notice Friday   . Follow-up    Glucose check     HPI HPI Comments: Austin Valencia. is a 50 y.o. male who presents to [facility] reporting for a medication refill. He was seen a month ago by Dr. Joseph Art and was prescribed Dapagliflozin-Metformin due to blood sugar in the 200s. He has been checking his fasting glucose in the morning three days per weeks and it has been between 160-190. He is not aware of any side effects of his medication.   He is also complaining of a mildly painful knot on the left side of his head, that is more painful with pressure. The knot is not red or hot. He has some seasonal allergies.    Past Medical History  Diagnosis Date  . Hypertension   . GERD (gastroesophageal reflux disease)   . Hyperlipidemia   . Diabetes mellitus without complication ([place])   . Headache(784.0)    Allergies  Allergen Reactions  . 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 Ace Inhibitors     See 05/23/14 angioedema with Losartan   Current Outpatient Prescriptions on File Prior to Visit  Medication Sig Dispense Refill  . amLODipine (NORVASC) 5 MG tablet Take 1 tablet (5 mg total) by mouth daily. 90 tablet 1  . Dapagliflozin-Metformin HCl ER (XIGDUO XR) 07-998 MG TB24 Take 5 mg by mouth every morning. 30 tablet 3  . doxazosin (CARDURA) 4 MG tablet TAKE ONE TABLET BY MOUTH ONCE DAILY 90 tablet 1  . fenofibrate (TRICOR) 145 MG tablet Take 1 tablet (145 mg total) by mouth daily. 30 tablet 2  . omeprazole (PRILOSEC) 20 MG capsule Take 20 mg by mouth daily.    .  simvastatin (ZOCOR) 10 MG tablet Take 1 tablet (10 mg total) by mouth at bedtime. 30 tablet 3   No current facility-administered medications on file prior to visit.    Review of Systems  Constitutional: Negative for fever and chills.  Eyes: Negative for visual disturbance.  Genitourinary: Negative for dysuria.  Skin: Negative for color change and wound.  Neurological: Negative for dizziness and light-headedness.      Objective:  BP 110/72 mmHg  Pulse 75  Temp(Src) 99.2 F (37.3 C) (Oral)  Resp 16  Ht 5' 7.5" (1.715 m)  Wt 217 lb (98.431 kg)  BMI 33.47 kg/m2  SpO2 97%  Physical Exam  Constitutional: He is oriented to person, place, and time. He appears well-developed and well-nourished. No distress.  HENT:  Head: Normocephalic and atraumatic.  Eyes: Pupils are equal, round, and reactive to light.  Neck: Neck supple.  Cardiovascular: Normal rate.   Pulmonary/Chest: Effort normal. No respiratory distress.  Musculoskeletal: Normal range of motion.  Neurological: He is alert and oriented to person, place, and time. Coordination normal.  Skin: Skin is warm and dry. He is not diaphoretic.  1/2cm poorly defined sub cutaneous nodule. No fluctuance or warmth, no overlying skin changes. Not adhered to overlying skin. No pre or postauricular adenopathy. Normal thyroid. No  cervical or supraclavicular adenopathy.   Psychiatric: He has a normal mood and affect. His behavior is normal.  Nursing note and vitals reviewed.     Assessment & Plan:   1. Uncontrolled type 2 diabetes mellitus without complication, without long-term current use of insulin (H[facility])   2. Lump of scalp - feels very benign - like a tiny lipoma or subcutaneous tissue thickening - could even be a rogue lymph node. Does not feel well defined enough to be a cyst - rec wet warm compresses and can follow w/ top mupirocin prn. Watchful waiting, RTC for further eval if becomes tender, enlarges, or any other concerns.  home cbgs  are much improved on xigou 07-998 qd rather than the sufonylurea but still above goal so double up on pill to max dose. Recheck in 3 mos.   Meds ordered this encounter  Medications  . Dapagliflozin-Metformin HCl ER (XIGDUO XR) 07-998 MG TB24    Sig: Take 10 mg by mouth every morning. Take 2 tabs with breakfast daily    Dispense:  60 tablet    Refill:  11  . mupirocin ointment (BACTROBAN) 2 %    Sig: Apply 1 application topically 3 (three) times daily.    Dispense:  22 g    Refill:  1    I personally performed the services described in this documentation, which was scribed in my presence. The recorded information has been reviewed and considered, and addended by me as needed.  [name], MD MPH

## 2015-07-21 NOTE — Patient Instructions (Addendum)
IF you received an x-ray today, you will receive an invoice from [facility]. Please contact [facility] at [phone] with questions or concerns regarding your invoice.   IF you received labwork today, you will receive an invoice from [facility]. Please contact [facility] at [phone] with questions or concerns regarding your invoice.   Our billing staff will not be able to assist you with questions regarding bills from these companies.  You will be contacted with the lab results as soon as they are available. The fastest way to get your results is to activate your My Chart account. Instructions are located on the last page of this paperwork. If you have not heard from [place] regarding the results in 2 weeks, please contact this office.     Heat to the scalp (warm wet washcloth, consider wrapping it around a spoon dipped in boiling water) 2 to 4 times a day followed by mupirocin topically.  If it is growing or getting bigger come back.  Double up on your Xigduo XR - 2 tabs with breakfast - you should get this for free indefinitely.  Recheck in about 3 months. Watch for getting to low of sugars - check if you feel they are to low.  Hypoglycemia Hypoglycemia occurs when the glucose in your blood is too low. Glucose is a type of sugar that is your body's main energy source. Hormones, such as insulin and glucagon, control the level of glucose in the blood. Insulin lowers blood glucose and glucagon increases blood glucose. Having too much insulin in your blood stream, or not eating enough food containing sugar, can result in hypoglycemia. Hypoglycemia can happen to people with or without diabetes. It can develop quickly and can be a medical emergency.  CAUSES   Missing or delaying meals.  Not eating enough carbohydrates at meals.  Taking too much diabetes medicine.  Not timing your oral diabetes medicine or insulin doses with meals, snacks, and  exercise.  Nausea and vomiting.  Certain medicines.  Severe illnesses, such as hepatitis, kidney disorders, and certain eating disorders.  Increased activity or exercise without eating something extra or adjusting medicines.  Drinking too much alcohol.  A nerve disorder that affects body functions like your heart rate, blood pressure, and digestion (autonomic neuropathy).  A condition where the stomach muscles do not function properly (gastroparesis). Therefore, medicines and food may not absorb properly.  Rarely, a tumor of the pancreas can produce too much insulin. SYMPTOMS   Hunger.  Sweating (diaphoresis).  Change in body temperature.  Shakiness.  Headache.  Anxiety.  Lightheadedness.  Irritability.  Difficulty concentrating.  Dry mouth.  Tingling or numbness in the hands or feet.  Restless sleep or sleep disturbances.  Altered speech and coordination.  Change in mental status.  Seizures or prolonged convulsions.  Combativeness.  Drowsiness (lethargic).  Weakness.  Increased heart rate or palpitations.  Confusion.  Pale, gray skin color.  Blurred or double vision.  Fainting. DIAGNOSIS  A physical exam and medical history will be performed. Your caregiver may make a diagnosis based on your symptoms. Blood tests and other lab tests may be performed to confirm a diagnosis. Once the diagnosis is made, your caregiver will see if your signs and symptoms go away once your blood glucose is raised.  TREATMENT  Usually, you can easily treat your hypoglycemia when you notice symptoms.  Check your blood glucose. If it is less than 70 mg/dl, take one of the following:  3-4 glucose tablets.    cup juice.    cup regular soda.   1 cup skim milk.   -1 tube of glucose gel.   5-6 hard candies.   Avoid high-fat drinks or food that may delay a rise in blood glucose levels.  Do not take more than the recommended amount of sugary foods,  drinks, gel, or tablets. Doing so will cause your blood glucose to go too high.   Wait 10-15 minutes and recheck your blood glucose. If it is still less than 70 mg/dl or below your target range, repeat treatment.   Eat a snack if it is more than 1 hour until your next meal.  There may be a time when your blood glucose may go so low that you are unable to treat yourself at home when you start to notice symptoms. You may need someone to help you. You may even faint or be unable to swallow. If you cannot treat yourself, someone will need to bring you to the hospital.  [place]  If you have diabetes, follow your diabetes management plan by:  Taking your medicines as directed.  Following your exercise plan.  Following your meal plan. Do not skip meals. Eat on time.  Testing your blood glucose regularly. Check your blood glucose before and after exercise. If you exercise longer or different than usual, be sure to check blood glucose more frequently.  Wearing your medical alert jewelry that says you have diabetes.  Identify the cause of your hypoglycemia. Then, develop ways to prevent the recurrence of hypoglycemia.  Do not take a hot bath or shower right after an insulin shot.  Always carry treatment with you. Glucose tablets are the easiest to carry.  If you are going to drink alcohol, drink it only with meals.  Tell friends or family members ways to keep you safe during a seizure. This may include removing hard or sharp objects from the area or turning you on your side.  Maintain a healthy weight. SEEK MEDICAL CARE IF:   You are having problems keeping your blood glucose in your target range.  You are having frequent episodes of hypoglycemia.  You feel you might be having side effects from your medicines.  You are not sure why your blood glucose is dropping so low.  You notice a change in vision or a new problem with your vision. SEEK IMMEDIATE MEDICAL CARE IF:    Confusion develops.  A change in mental status occurs.  The inability to swallow develops.  Fainting occurs.   This information is not intended to replace advice given to you by your health care provider. Make sure you discuss any questions you have with your health care provider.   Document Released: 03/09/2005 Document Revised: 03/14/2013 Document Reviewed: 11/13/2014 Elsevier Interactive Patient Education [employer].

## 2015-08-17 ENCOUNTER — Other Ambulatory Visit: Payer: Self-pay | Admitting: Family

## 2015-08-20 ENCOUNTER — Other Ambulatory Visit: Payer: Self-pay | Admitting: *Deleted

## 2015-08-20 MED ORDER — FENOFIBRATE 145 MG PO TABS: 145.0000 mg | ORAL_TABLET | Freq: Every day | ORAL | Status: DC

## 2015-09-12 ENCOUNTER — Encounter: Payer: Self-pay | Admitting: Family

## 2015-09-12 ENCOUNTER — Ambulatory Visit (INDEPENDENT_AMBULATORY_CARE_PROVIDER_SITE_OTHER): Payer: BC Managed Care – PPO | Admitting: Family

## 2015-09-12 VITALS — BP 122/80 | HR 86 | Temp 98.6°F | Resp 16 | Ht 67.5 in | Wt 225.0 lb

## 2015-09-12 DIAGNOSIS — E1165 Type 2 diabetes mellitus with hyperglycemia: Secondary | ICD-10-CM | POA: Diagnosis not present

## 2015-09-12 DIAGNOSIS — Z23 Encounter for immunization: Secondary | ICD-10-CM

## 2015-09-12 DIAGNOSIS — E785 Hyperlipidemia, unspecified: Secondary | ICD-10-CM | POA: Diagnosis not present

## 2015-09-12 DIAGNOSIS — IMO0001 Reserved for inherently not codable concepts without codable children: Secondary | ICD-10-CM

## 2015-09-12 LAB — POCT GLYCOSYLATED HEMOGLOBIN (HGB A1C): Hemoglobin A1C: 6.2

## 2015-09-12 MED ORDER — SIMVASTATIN 10 MG PO TABS: 10.0000 mg | ORAL_TABLET | Freq: Every day | ORAL | Status: DC

## 2015-09-12 NOTE — Assessment & Plan Note (Addendum)
Type 2 diabetes with significantly improved control with in POCT A1c of 6.2. Continue current dosage of Xigduo. All diabetes prevention is up-to-date. Maintained on simvastatin for CAD risk reduction. Follow-up in 6 months or sooner.

## 2015-09-12 NOTE — Patient Instructions (Addendum)
Thank you for choosing [facility].  Summary/Instructions:  Please continue to take your medication as prescribed.   Please stop by the lab when fasting for a cholesterol/triglyceride check. The lab is open from 7:30 in the morning until 5:30 in the evening with no appointment necessary. Please ensure no food or drink with the exception of black coffee or water prior to the test.  Your prescription(s) have been submitted to your pharmacy or been printed and provided for you. Please take as directed and contact our office if you believe you are having problem(s) with the medication(s) or have any questions.  Please stop by the lab on the basement level of the building for your blood work. Your results will be released to [place] (or called to you) after review, usually within 72 hours after test completion. If any changes need to be made, you will be notified at that same time.  If your symptoms worsen or fail to improve, please contact our office for further instruction, or in case of emergency go directly to the emergency room at the closest medical facility.

## 2015-09-12 NOTE — Progress Notes (Signed)
Pre visit review using our clinic review tool, if applicable. No additional management support is needed unless otherwise documented below in the visit note. 

## 2015-09-12 NOTE — Assessment & Plan Note (Signed)
Hyperlipidemia currently maintained on simvastatin and fenofibrate. Obtain lipid profile to check current status and improvement in overall lipids. Continue current dosage of simvastatin and fenofibrate pending lipid profile results.

## 2015-09-12 NOTE — Progress Notes (Signed)
Subjective:    Patient ID: Austin Valencia., male    DOB: 12/09/1965, 50 y.o.   MRN: [id number]  Chief Complaint  Patient presents with  . Follow-up    diabetes    HPI:  Austin Valencia. is a 50 y.o. male who  has a past medical history of Hypertension; GERD (gastroesophageal reflux disease); Hyperlipidemia; Diabetes mellitus without complication ([place]); and [id number]). and presents today for a follow up office visit.   1.) Type 2 diabetes - Currently maintained on the Xigduo. Reports taking the medication as prescribed and denies adverse side effects or hypoglycemic readings. Blood sugars at home . Denies symptoms of end organ damage. Blood sugars at home have been averaging around 130. Continues to work on nutrition and exercise.  Lab Results  Component Value Date   HGBA1C 6.2 09/12/2015    2.) Hyperlipidemia - currently maintained on simvastatin and fenofibrate. Reports taking the medication as prescribed and denies adverse side effects. No symptoms of myalgias. Denies chest pain, shortness of breath, or heart palpitations.  Lab Results  Component Value Date   CHOL 122 04/25/2015   HDL 26.60* 04/25/2015   LDLCALC 68 05/13/2010   LDLDIRECT 56.0 04/25/2015   TRIG * 04/25/2015    706.0 Triglyceride is over 400; calculations on Lipids are invalid.   CHOLHDL 5 04/25/2015    Allergies  Allergen Reactions  . 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 Ace Inhibitors     See 05/23/14 angioedema with Losartan     Current Outpatient Prescriptions on File Prior to Visit  Medication Sig Dispense Refill  . amLODipine (NORVASC) 5 MG tablet Take 1 tablet (5 mg total) by mouth daily. 90 tablet 1  . Dapagliflozin-Metformin HCl ER (XIGDUO XR) 07-998 MG TB24 Take 10 mg by mouth every morning. Take 2 tabs with breakfast daily 60 tablet 11  . fenofibrate (TRICOR) 145 MG tablet Take 1 tablet (145 mg total) by mouth daily. 30 tablet 11  . omeprazole (PRILOSEC) 20 MG capsule Take  20 mg by mouth daily.     No current facility-administered medications on file prior to visit.     Review of Systems  Constitutional: Negative for fever and chills.  Eyes: Negative for visual disturbance.  Respiratory: Negative for chest tightness and shortness of breath.   Cardiovascular: Negative for chest pain, palpitations and leg swelling.  Endocrine: Negative for polydipsia, polyphagia and polyuria.      Objective:    BP 122/80 mmHg  Pulse 86  Temp(Src) 98.6 F (37 C) (Oral)  Resp 16  Ht 5' 7.5" (1.715 m)  Wt 225 lb (102.059 kg)  BMI 34.70 kg/m2  SpO2 96% Nursing note and vital signs reviewed.  Physical Exam  Constitutional: He is oriented to person, place, and time. He appears well-developed and well-nourished. No distress.  Cardiovascular: Normal rate, regular rhythm, normal heart sounds and intact distal pulses.   Pulmonary/Chest: Effort normal and breath sounds normal.  Neurological: He is alert and oriented to person, place, and time.  Skin: Skin is warm and dry.  Psychiatric: He has a normal mood and affect. His behavior is normal. Judgment and thought content normal.       Assessment & Plan:   Problem List Items Addressed This Visit      Endocrine   Uncontrolled type 2 diabetes mellitus without complication, without long-term current use of insulin ([place]) - Primary    Type 2 diabetes with significantly improved control with  in POCT A1c of 6.2. Continue current dosage of Xigduo. All diabetes prevention is up-to-date. Maintained on simvastatin for CAD risk reduction. Follow-up in 6 months or sooner.      Relevant Medications   simvastatin (ZOCOR) 10 MG tablet   Other Relevant Orders   POCT HgB A1C (Completed)     Other   Hyperlipidemia    Hyperlipidemia currently maintained on simvastatin and fenofibrate. Obtain lipid profile to check current status and improvement in overall lipids. Continue current dosage of simvastatin and fenofibrate pending lipid  profile results.      Relevant Medications   simvastatin (ZOCOR) 10 MG tablet   Other Relevant Orders   Lipid Profile    Other Visit Diagnoses    Need for Tdap vaccination        Relevant Orders    Tdap vaccine greater than or equal to 7yo IM (Completed)        I have discontinued Mr. Atlas's doxazosin and mupirocin ointment. I am also having him maintain his omeprazole, amLODipine, Dapagliflozin-Metformin HCl ER, fenofibrate, alfuzosin, and simvastatin.   Meds ordered this encounter  Medications  . alfuzosin (UROXATRAL) 10 MG 24 hr tablet    Sig: Take 10 mg by mouth daily with breakfast.  . simvastatin (ZOCOR) 10 MG tablet    Sig: Take 1 tablet (10 mg total) by mouth at bedtime.    Dispense:  90 tablet    Refill:  1    Order Specific Question:  Supervising Provider    Answer:  Pricilla Holm A [id number]     Follow-up: Return in about 6 months (around 03/13/2016).  Mauricio Po, FNP

## 2015-09-13 ENCOUNTER — Ambulatory Visit: Payer: BC Managed Care – PPO | Admitting: Family

## 2015-09-23 ENCOUNTER — Other Ambulatory Visit (INDEPENDENT_AMBULATORY_CARE_PROVIDER_SITE_OTHER): Payer: BC Managed Care – PPO

## 2015-09-23 DIAGNOSIS — E785 Hyperlipidemia, unspecified: Secondary | ICD-10-CM

## 2015-09-23 LAB — LIPID PANEL
CHOLESTEROL: 105 mg/dL (ref 0–200)
HDL: 27.9 mg/dL — ABNORMAL LOW (ref >39.00)
LDL CALC: 58 mg/dL (ref 0–99)
NonHDL: 76.78
TRIGLYCERIDES: 95 mg/dL (ref 0.0–149.0)
Total CHOL/HDL Ratio: 4
VLDL: 19 mg/dL (ref 0.0–40.0)

## 2015-10-04 IMAGING — CR DG CHEST 2V
2 series · 2 of 2 positions shown · non-contrast
Comparison: None.

CLINICAL DATA: GERD. Hypertension. Excision of lipoma from right
axilla. Preop chest. Dyspnea on exertion. Diabetes.

EXAM:
CHEST  2 VIEW

[w chest pa]
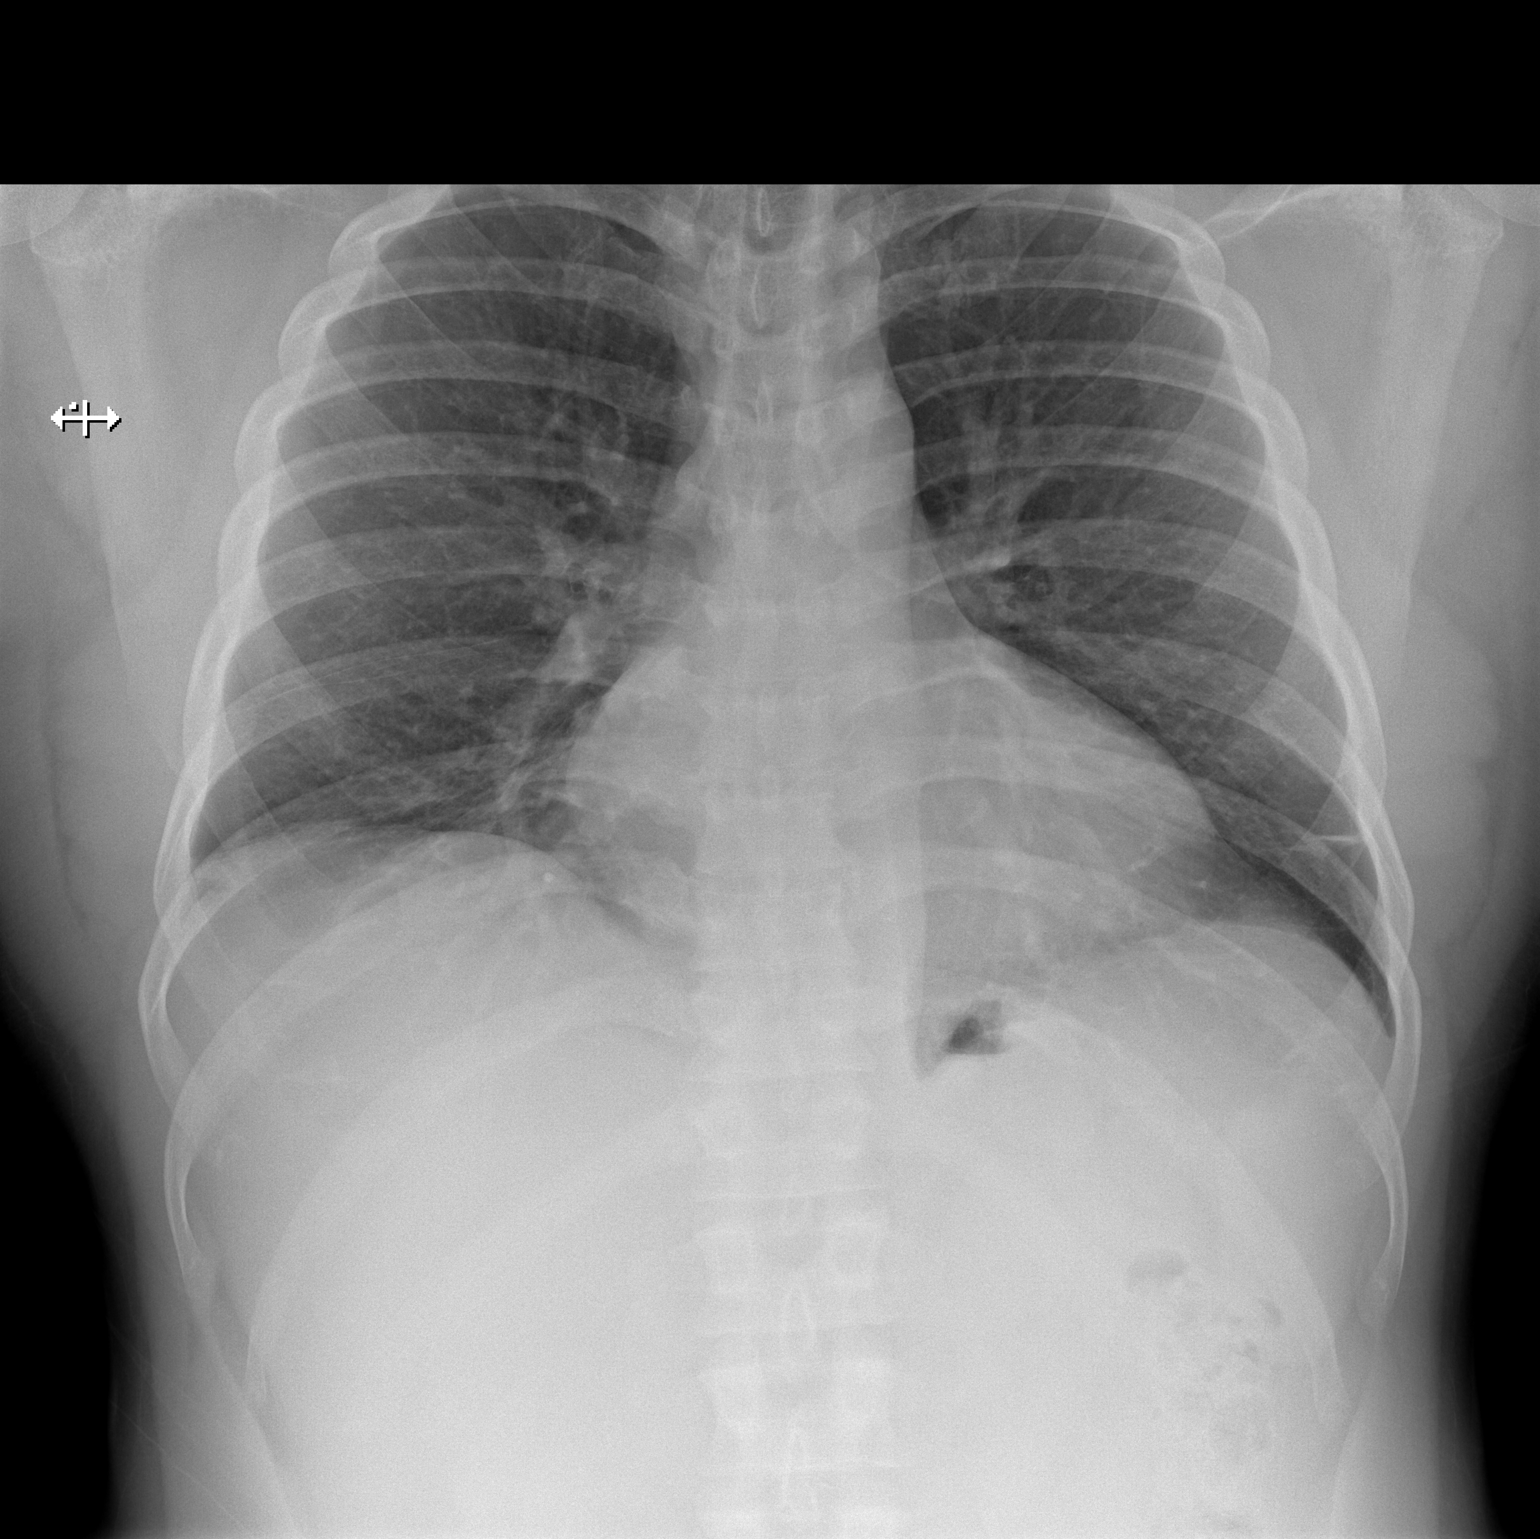

[w chest lat]
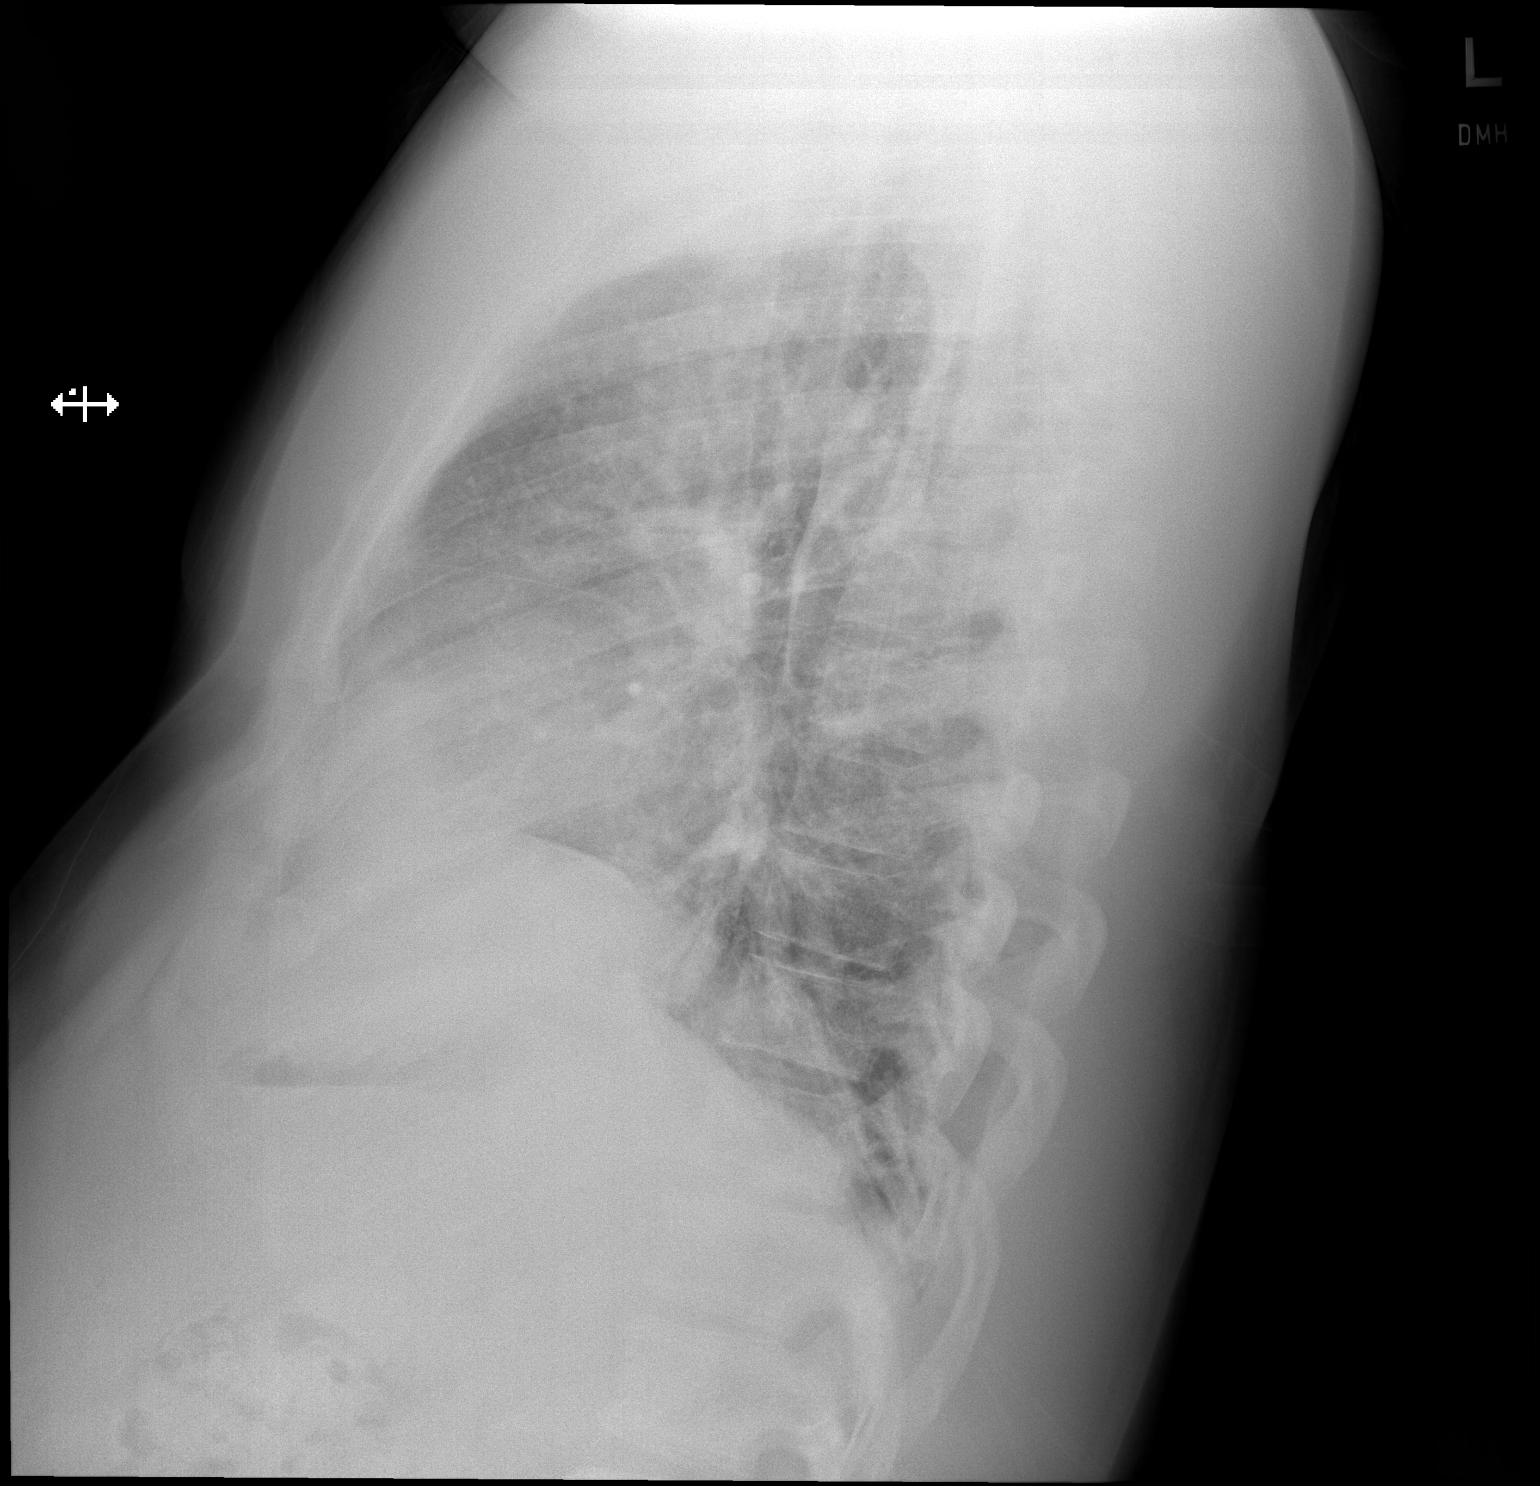

[2 of 2 positions shown; findings below may reference images not displayed]

FINDINGS: Shallow inflation. Minimal bibasilar atelectasis. There are no focal
consolidations or pleural effusions. No pulmonary edema. Heart size
is upper limits normal.
IMPRESSION: No active cardiopulmonary disease.

## 2016-01-27 ENCOUNTER — Emergency Department (HOSPITAL_COMMUNITY)
Admission: EM | Admit: 2016-01-27 | Discharge: 2016-01-27 | Disposition: A | Discharge status: Home / Self Care | Payer: BC Managed Care – PPO | Attending: Emergency Medicine | Admitting: Emergency Medicine

## 2016-01-27 ENCOUNTER — Encounter (HOSPITAL_COMMUNITY): Payer: Self-pay | Admitting: *Deleted

## 2016-01-27 DIAGNOSIS — Z79899 Other long term (current) drug therapy: Secondary | ICD-10-CM | POA: Diagnosis not present

## 2016-01-27 DIAGNOSIS — E119 Type 2 diabetes mellitus without complications: Secondary | ICD-10-CM | POA: Insufficient documentation

## 2016-01-27 DIAGNOSIS — N342 Other urethritis: Secondary | ICD-10-CM | POA: Diagnosis not present

## 2016-01-27 DIAGNOSIS — I10 Essential (primary) hypertension: Secondary | ICD-10-CM | POA: Diagnosis not present

## 2016-01-27 DIAGNOSIS — Z202 Contact with and (suspected) exposure to infections with a predominantly sexual mode of transmission: Secondary | ICD-10-CM

## 2016-01-27 LAB — URINALYSIS, ROUTINE W REFLEX MICROSCOPIC
Bilirubin Urine: NEGATIVE
Ketones, ur: NEGATIVE mg/dL
LEUKOCYTES UA: NEGATIVE
Nitrite: NEGATIVE
Protein, ur: NEGATIVE mg/dL
SPECIFIC GRAVITY, URINE: 1.025 (ref 1.005–1.030)
pH: 5.5 (ref 5.0–8.0)

## 2016-01-27 LAB — URINE MICROSCOPIC-ADD ON: SQUAMOUS EPITHELIAL / LPF: NONE SEEN

## 2016-01-27 MED ORDER — METRONIDAZOLE 500 MG PO TABS
2000.0000 mg | ORAL_TABLET | Freq: Once | ORAL | Status: AC
  Administered 2016-01-27: 2000 mg via ORAL
  Filled 2016-01-27: qty 4

## 2016-01-27 MED ORDER — LIDOCAINE HCL (PF) 1 % IJ SOLN
INTRAMUSCULAR | Status: AC
  Filled 2016-01-27: qty 5

## 2016-01-27 MED ORDER — CEFTRIAXONE SODIUM 250 MG IJ SOLR
250.0000 mg | Freq: Once | INTRAMUSCULAR | Status: AC
  Administered 2016-01-27: 250 mg via INTRAMUSCULAR
  Filled 2016-01-27: qty 250

## 2016-01-27 MED ORDER — AZITHROMYCIN 250 MG PO TABS
1000.0000 mg | ORAL_TABLET | Freq: Once | ORAL | Status: AC
  Administered 2016-01-27: 1000 mg via ORAL
  Filled 2016-01-27: qty 4

## 2016-01-27 MED ORDER — DOXYCYCLINE HYCLATE 100 MG PO CAPS: 100.0000 mg | ORAL_CAPSULE | Freq: Two times a day (BID) | ORAL | 0 refills | Status: DC

## 2016-01-27 NOTE — ED Provider Notes (Signed)
[facility] DEPT Provider Note   CSN: [id number] Arrival date & time: 01/27/16  0137     History   Chief Complaint Chief Complaint  Patient presents with  . Exposure to STD    HPI Austin Sulla. is a 50 y.o. male.  Patient reports today history of tingling and burning when he urinates. He does report a slight urethral discharge as well. No external sores noted. No testicular pain or swelling. Patient reports that he had a recent sexual encounter with a woman he didn't know well and the condom broke.      Past Medical History:  Diagnosis Date  . Diabetes mellitus without complication ([place])   . GERD (gastroesophageal reflux disease)   . Headache(784.0)   . Hyperlipidemia   . Hypertension     Patient Active Problem List   Diagnosis Date Noted  . Uncontrolled type 2 diabetes mellitus without complication, without long-term current use of insulin ([place]) 06/22/2015  . 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 GERD 03/28/2009  . Hyperlipidemia 02/25/2009  . DEPRESSION 02/25/2009  . Essential hypertension 02/25/2009  . BPH (benign prostatic hyperplasia) 02/25/2009    Past Surgical History:  Procedure Laterality Date  . HERNIA REPAIR     umbilical child  . LIPOMA EXCISION Right 11/29/2013   Procedure: EXCISION LIPOMA RIGHT AXILLA;  Surgeon: Autumn Messing III, MD;  Location: [facility];  Service: General;  Laterality: Right;       Home Medications    Prior to Admission medications   Medication Sig Start Date End Date Taking? Authorizing Provider  amLODipine (NORVASC) 5 MG tablet Take 1 tablet (5 mg total) by mouth daily. 07/15/15  Yes Golden Circle, FNP  Dapagliflozin-Metformin HCl ER (XIGDUO XR) 07-998 MG TB24 Take 10 mg by mouth every morning. Take 2 tabs with breakfast daily 07/21/15  Yes Shawnee Knapp, MD  fenofibrate (TRICOR) 145 MG tablet Take 1 tablet (145 mg total) by mouth daily. 08/20/15  Yes Golden Circle, FNP  omeprazole (PRILOSEC) 20 MG capsule  Take 20 mg by mouth daily.   Yes Historical Provider, MD  simvastatin (ZOCOR) 10 MG tablet Take 1 tablet (10 mg total) by mouth at bedtime. 09/12/15  Yes Golden Circle, FNP  alfuzosin (UROXATRAL) 10 MG 24 hr tablet Take 10 mg by mouth daily with breakfast.    Historical Provider, MD  doxycycline (VIBRAMYCIN) 100 MG capsule Take 1 capsule (100 mg total) by mouth 2 (two) times daily. 01/27/16   Orpah Greek, MD    Family History Family History  Problem Relation Age of Onset  . Cancer Father     lung  . Diabetes Father   . Hypertension Father   . Hyperlipidemia Mother   . Hypertension Mother     Social History Social History  Substance Use Topics  . Smoking status: Never Smoker  . Smokeless tobacco: Never Used  . Alcohol use No     Allergies   Losartan and Ace inhibitors   Review of Systems Review of Systems  Genitourinary: Positive for discharge and dysuria.  All other systems reviewed and are negative.    Physical Exam Updated Vital Signs BP 140/82   Pulse 95   Temp 97.7 F (36.5 C) (Oral)   Resp 16   Ht 5\' 7"  (1.702 m)   Wt 221 lb (100.2 kg)   SpO2 96%   BMI 34.61 kg/m   Physical Exam  Constitutional: He is oriented to person, place, and  time. He appears well-developed and well-nourished. No distress.  HENT:  Head: Normocephalic and atraumatic.  Right Ear: Hearing normal.  Left Ear: Hearing normal.  Nose: Nose normal.  Mouth/Throat: Oropharynx is clear and moist and mucous membranes are normal.  Eyes: Conjunctivae and EOM are normal. Pupils are equal, round, and reactive to light.  Neck: Normal range of motion. Neck supple.  Cardiovascular: Regular rhythm, S1 normal and S2 normal.  Exam reveals no gallop and no friction rub.   No murmur heard. Pulmonary/Chest: Effort normal and breath sounds normal. No respiratory distress. He exhibits no tenderness.  Abdominal: Soft. Normal appearance and bowel sounds are normal. There is no  hepatosplenomegaly. There is no tenderness. There is no rebound, no guarding, no tenderness at McBurney's point and negative Murphy's sign. No hernia.  Genitourinary: Testes normal and penis normal. Circumcised.  Musculoskeletal: Normal range of motion.  Lymphadenopathy: No inguinal adenopathy noted on the right or left side.  Neurological: He is alert and oriented to person, place, and time. He has normal strength. No cranial nerve deficit or sensory deficit. Coordination normal. GCS eye subscore is 4. GCS verbal subscore is 5. GCS motor subscore is 6.  Skin: Skin is warm, dry and intact. No rash noted. No cyanosis.  Psychiatric: He has a normal mood and affect. His speech is normal and behavior is normal. Thought content normal.  Nursing note and vitals reviewed.    ED Treatments / Results  Labs (all labs ordered are listed, but only abnormal results are displayed) Labs Reviewed  URINALYSIS, ROUTINE W REFLEX MICROSCOPIC (NOT AT [facility]) - Abnormal; Notable for the following:       Result Value   Glucose, UA >1000 (*)    Hgb urine dipstick SMALL (*)    All other components within normal limits  URINE MICROSCOPIC-ADD ON - Abnormal; Notable for the following:    Bacteria, UA RARE (*)    All other components within normal limits  GC/CHLAMYDIA PROBE AMP ([place]) NOT AT [facility]    EKG  EKG Interpretation None       Radiology No results found.  Procedures Procedures (including critical care time)  Medications Ordered in ED Medications  lidocaine (PF) (XYLOCAINE) 1 % injection (not administered)  cefTRIAXone (ROCEPHIN) injection 250 mg (250 mg Intramuscular Given 01/27/16 0206)  azithromycin (ZITHROMAX) tablet 1,000 mg (1,000 mg Oral Given 01/27/16 0207)  metroNIDAZOLE (FLAGYL) tablet 2,000 mg (2,000 mg Oral Given 01/27/16 0208)     Initial Impression / Assessment and Plan / ED Course  I have reviewed the triage vital signs and the nursing notes.  Pertinent labs & imaging  results that were available during my care of the patient were reviewed by me and considered in my medical decision making (see chart for details).  Clinical Course   Patient with urethral discharge and dysuria consistent with urethritis secondary to STD. GC and chlamydia pending. Patient treated empirically.  Final Clinical Impressions(s) / ED Diagnoses   Final diagnoses:  Urethritis  Possible exposure to STD    New Prescriptions New Prescriptions   DOXYCYCLINE (VIBRAMYCIN) 100 MG CAPSULE    Take 1 capsule (100 mg total) by mouth 2 (two) times daily.     Orpah Greek, MD 01/27/16 [phone]

## 2016-01-27 NOTE — ED Triage Notes (Signed)
Pt states that he may have been exposed to an std, started having "tingling" sensation to penis area today,

## 2016-01-28 LAB — GC/CHLAMYDIA PROBE AMP (~~LOC~~) NOT AT ARMC
CHLAMYDIA, DNA PROBE: NEGATIVE
NEISSERIA GONORRHEA: POSITIVE — AB

## 2016-02-07 ENCOUNTER — Ambulatory Visit (INDEPENDENT_AMBULATORY_CARE_PROVIDER_SITE_OTHER): Payer: BC Managed Care – PPO | Admitting: Urgent Care

## 2016-02-07 ENCOUNTER — Ambulatory Visit (HOSPITAL_COMMUNITY)
Admission: RE | Admit: 2016-02-07 | Discharge: 2016-02-07 | Disposition: A | Discharge status: Home / Self Care | Payer: BC Managed Care – PPO | Source: Ambulatory Visit | Attending: Urgent Care | Admitting: Urgent Care

## 2016-02-07 ENCOUNTER — Ambulatory Visit (INDEPENDENT_AMBULATORY_CARE_PROVIDER_SITE_OTHER): Payer: BC Managed Care – PPO

## 2016-02-07 VITALS — BP 110/62 | HR 87 | Temp 98.2°F | Resp 18 | Ht 67.0 in | Wt 227.2 lb

## 2016-02-07 DIAGNOSIS — R109 Unspecified abdominal pain: Secondary | ICD-10-CM | POA: Diagnosis not present

## 2016-02-07 DIAGNOSIS — R1084 Generalized abdominal pain: Secondary | ICD-10-CM | POA: Diagnosis not present

## 2016-02-07 DIAGNOSIS — E119 Type 2 diabetes mellitus without complications: Secondary | ICD-10-CM

## 2016-02-07 DIAGNOSIS — J9811 Atelectasis: Secondary | ICD-10-CM | POA: Diagnosis not present

## 2016-02-07 DIAGNOSIS — K760 Fatty (change of) liver, not elsewhere classified: Secondary | ICD-10-CM | POA: Diagnosis not present

## 2016-02-07 DIAGNOSIS — K4090 Unilateral inguinal hernia, without obstruction or gangrene, not specified as recurrent: Secondary | ICD-10-CM | POA: Diagnosis not present

## 2016-02-07 DIAGNOSIS — E781 Pure hyperglyceridemia: Secondary | ICD-10-CM | POA: Diagnosis not present

## 2016-02-07 LAB — COMPLETE METABOLIC PANEL WITH GFR
ALBUMIN: 4.5 g/dL (ref 3.6–5.1)
ALK PHOS: 47 U/L (ref 40–115)
ALT: 19 U/L (ref 9–46)
AST: 21 U/L (ref 10–40)
BILIRUBIN TOTAL: 0.7 mg/dL (ref 0.2–1.2)
BUN: 18 mg/dL (ref 7–25)
CALCIUM: 9.3 mg/dL (ref 8.6–10.3)
CO2: 25 mmol/L (ref 20–31)
Chloride: 102 mmol/L (ref 98–110)
Creat: 1.47 mg/dL — ABNORMAL HIGH (ref 0.60–1.35)
GFR, EST AFRICAN AMERICAN: 64 mL/min (ref >=60)
GFR, EST NON AFRICAN AMERICAN: 56 mL/min — AB (ref >=60)
GLUCOSE: 197 mg/dL — AB (ref 65–99)
Potassium: 3.7 mmol/L (ref 3.5–5.3)
SODIUM: 138 mmol/L (ref 135–146)
TOTAL PROTEIN: 6.9 g/dL (ref 6.1–8.1)

## 2016-02-07 LAB — POCT CBC
Granulocyte percent: 66.5 %G (ref 37–80)
HCT, POC: 41.5 % — AB (ref 43.5–53.7)
HEMOGLOBIN: 14.7 g/dL (ref 14.1–18.1)
LYMPH, POC: 1.5 (ref 0.6–3.4)
MCH: 33 pg — AB (ref 27–31.2)
MCHC: 35.4 g/dL (ref 31.8–35.4)
MCV: 93 fL (ref 80–97)
MID (cbc): 0.3 (ref 0–0.9)
MPV: 7.2 fL (ref 0–99.8)
PLATELET COUNT, POC: 207 10*3/uL (ref 142–424)
POC Granulocyte: 3.6 (ref 2–6.9)
POC LYMPH PERCENT: 28.2 %L (ref 10–50)
POC MID %: 5.3 %M (ref 0–12)
RBC: 4.46 M/uL — AB (ref 4.69–6.13)
RDW, POC: 13.3 %
WBC: 5.4 10*3/uL (ref 4.6–10.2)

## 2016-02-07 LAB — POCT URINALYSIS DIP (MANUAL ENTRY)
Bilirubin, UA: NEGATIVE
Glucose, UA: >=1000 — AB
Leukocytes, UA: NEGATIVE
NITRITE UA: NEGATIVE
PH UA: 5.5
PROTEIN UA: NEGATIVE
RBC UA: NEGATIVE
Spec Grav, UA: 1.02
UROBILINOGEN UA: 0.2

## 2016-02-07 LAB — POC MICROSCOPIC URINALYSIS (UMFC): Mucus: ABSENT

## 2016-02-07 LAB — POCT GLYCOSYLATED HEMOGLOBIN (HGB A1C): HEMOGLOBIN A1C: 5.7

## 2016-02-07 LAB — GLUCOSE, POCT (MANUAL RESULT ENTRY): POC Glucose: 192 mg/dl — AB (ref 70–99)

## 2016-02-07 MED ORDER — IOPAMIDOL (ISOVUE-300) INJECTION 61%: 30.0000 mL | Freq: Once | INTRAVENOUS | Status: DC | PRN

## 2016-02-07 MED ORDER — IOPAMIDOL (ISOVUE-300) INJECTION 61%
100.0000 mL | Freq: Once | INTRAVENOUS | Status: AC | PRN
  Administered 2016-02-07: 100 mL via INTRAVENOUS

## 2016-02-07 MED ORDER — IOPAMIDOL (ISOVUE-300) INJECTION 61%
INTRAVENOUS | Status: AC
  Filled 2016-02-07: qty 30

## 2016-02-07 NOTE — Progress Notes (Signed)
MRN: [id number] DOB: 11-20-1965  Subjective:   Austin Valencia. is a 50 y.o. male presenting for chief complaint of Abdominal Pain (mid stomach pain started around 3am today) and Back Pain (lower back pain 3am today )  Reports sudden onset of mid-lower abdominal pain over night, has been radiating to his back over same area. Has had associated nausea, subjective fever, heart racing. Had a 6 month history of intermittent left-sided groin pain which is still there and is worried that it is related. Has not been worked up for a hernia. Denies diarrhea, bloody stools, constipation, chest pain. Patient is a diabetic, last a1c was 6.2%, managed with Xigduo. Of note, patient was treated for urethritis earlier this month with doxycycline. He completed this course of antibiotic and notes that those symptoms are resolved.  [name] has a current medication list which includes the following prescription(s): alfuzosin, amlodipine, dapagliflozin-metformin hcl er, fenofibrate, omeprazole, and simvastatin. Also is allergic to losartan and ace inhibitors.  [place]  has a past medical history of Diabetes mellitus without complication ([place]); GERD (gastroesophageal reflux disease); Headache(784.0); Hyperlipidemia; and Hypertension. Also  has a past surgical history that includes Lipoma excision (Right, 11/29/2013) and Hernia repair.   Objective:   Vitals: BP 110/62   Pulse 87   Temp 98.2 F (36.8 C) (Oral)   Resp 18   Ht 5\' 7"  (1.702 m)   Wt 227 lb 3.2 oz (103.1 kg)   SpO2 95%   BMI 35.58 kg/m   Physical Exam  Constitutional: He is oriented to person, place, and time. He appears well-developed and well-nourished.  Cardiovascular: Normal rate, regular rhythm and intact distal pulses.  Exam reveals no gallop and no friction rub.   No murmur heard. Pulmonary/Chest: No respiratory distress. He has no wheezes. He has no rales.  Abdominal: Soft. Bowel sounds are normal. He exhibits distension. He exhibits no  mass. There is tenderness (left sided). There is no rebound and no guarding.  Well-healed surgical scar near umbilicus.  Neurological: He is alert and oriented to person, place, and time.   Results for orders placed or performed in visit on 02/07/16 (from the past 24 hour(s))  POCT CBC     Status: Abnormal   Collection Time: 02/07/16  2:36 PM  Result Value Ref Range   WBC 5.4 4.6 - 10.2 K/uL   Lymph, poc 1.5 0.6 - 3.4   POC LYMPH PERCENT 28.2 10 - 50 %L   MID (cbc) 0.3 0 - 0.9   POC MID % 5.3 0 - 12 %M   POC Granulocyte 3.6 2 - 6.9   Granulocyte percent 66.5 37 - 80 %G   RBC 4.46 (A) 4.69 - 6.13 M/uL   Hemoglobin 14.7 14.1 - 18.1 g/dL   HCT, POC 41.5 (A) 43.5 - 53.7 %   MCV 93.0 80 - 97 fL   MCH, POC 33.0 (A) 27 - 31.2 pg   MCHC 35.4 31.8 - 35.4 g/dL   RDW, POC 13.3 %   Platelet Count, POC 207 142 - 424 K/uL   MPV 7.2 0 - 99.8 fL  POCT urinalysis dipstick     Status: Abnormal   Collection Time: 02/07/16  2:37 PM  Result Value Ref Range   Color, UA yellow yellow   Clarity, UA clear clear   Glucose, UA >=1,000 (A) negative   Bilirubin, UA negative negative   Ketones, POC UA trace (5) (A) negative   Spec Grav, UA 1.020  Blood, UA negative negative   pH, UA 5.5    Protein Ur, POC negative negative   Urobilinogen, UA 0.2    Nitrite, UA Negative Negative   Leukocytes, UA Negative Negative  POCT glucose (manual entry)     Status: Abnormal   Collection Time: 02/07/16  2:37 PM  Result Value Ref Range   POC Glucose 192 (A) 70 - 99 mg/dl  POCT Microscopic Urinalysis ([facility])     Status: Abnormal   Collection Time: 02/07/16  2:38 PM  Result Value Ref Range   WBC,UR,HPF,POC Few (A) None WBC/hpf   RBC,UR,HPF,POC None None RBC/hpf   Bacteria Few (A) None, Too numerous to count   Mucus Absent Absent   Epithelial Cells, UR Per Microscopy Few (A) None, Too numerous to count cells/hpf  POCT glycosylated hemoglobin (Hb A1C)     Status: None   Collection Time: 02/07/16  2:41 PM    Result Value Ref Range   Hemoglobin A1C 5.7    Dg Abd 1 View  Result Date: 02/07/2016 CLINICAL DATA:  Left side abdominal pain. EXAM: ABDOMEN - 1 VIEW COMPARISON:  Single-view of the abdomen 04/06/2007. FINDINGS: The bowel gas pattern is nonobstructive. No evidence of urinary tract stone. Two ovoid radiopaque densities in the right abdomen are likely tablets. No focal bony abnormality. IMPRESSION: Negative exam. Electronically Signed   By: Inge Rise M.D.   On: 02/07/2016 14:59   Assessment and Plan :   1. Generalized abdominal pain 2. Left sided abdominal pain - Will send patient for abdominal CT stat due to acute onset of his abdominal pain. Last Cr in 09/2015 was elevated, okay to perform CT without contrast if it remains elevated today. Labs pending.  3. Type 2 diabetes mellitus without complication, without long-term current use of insulin ([facility]) - Decrease Xigduo dose to 5-1000mg  once daily. Recheck with PCP or here at our clinic in 3 months. Consider adding lisinopril at follow up. Labs pending.  4. Hypertriglyceridemia - Continue fenofibrate, stop simvastatin.   Jaynee Eagles, PA-C [facility] [phone] 02/07/2016 2:11 PM

## 2016-02-07 NOTE — Patient Instructions (Addendum)
[place] long ct today at 445pm for lab then ct scan at 545pm. Pa in process.  [place], [place], [place] [address]     Abdominal Pain, Adult Abdominal pain can be caused by many things. Often, abdominal pain is not serious and it gets better with no treatment or by being treated at home. However, sometimes abdominal pain is serious. Your health care provider will do a medical history and a physical exam to try to determine the cause of your abdominal pain. Follow these instructions at home:  Take over-the-counter and prescription medicines only as told by your health care provider. Do not take a laxative unless told by your health care provider.  Drink enough fluid to keep your urine clear or pale yellow.  Watch your condition for any changes.  Keep all follow-up visits as told by your health care provider. This is important. Contact a health care provider if:  Your abdominal pain changes or gets worse.  You are not hungry or you lose weight without trying.  You are constipated or have diarrhea for more than 2-3 days.  You have pain when you urinate or have a bowel movement.  Your abdominal pain wakes you up at night.  Your pain gets worse with meals, after eating, or with certain foods.  You are throwing up and cannot keep anything down.  You have a fever. Get help right away if:  Your pain does not go away as soon as your health care provider told you to expect.  You cannot stop throwing up.  Your pain is only in areas of the abdomen, such as the right side or the left lower portion of the abdomen.  You have bloody or black stools, or stools that look like tar.  You have severe pain, cramping, or bloating in your abdomen.  You have signs of dehydration, such as:  Dark urine, very little urine, or no urine.  Cracked lips.  Dry mouth.  Sunken eyes.  Sleepiness.  Weakness. This information is not intended to replace advice given to you by your health  care provider. Make sure you discuss any questions you have with your health care provider. Document Released: 12/17/2004 Document Revised: 09/27/2015 Document Reviewed: 08/21/2015 Elsevier Interactive Patient Education  2017 [employer].    IF you received an x-ray today, you will receive an invoice from [facility]. Please contact [facility] at [phone] with questions or concerns regarding your invoice.   IF you received labwork today, you will receive an invoice from [employer]. Please contact [facility] at [phone] with questions or concerns regarding your invoice.   Our billing staff will not be able to assist you with questions regarding bills from these companies.  You will be contacted with the lab results as soon as they are available. The fastest way to get your results is to activate your My Chart account. Instructions are located on the last page of this paperwork. If you have not heard from [place] regarding the results in 2 weeks, please contact this office.

## 2016-02-08 LAB — LIPASE: Lipase: 17 U/L (ref 7–60)

## 2016-02-08 LAB — MICROALBUMIN, URINE: MICROALB UR: 1.2 mg/dL

## 2016-02-10 ENCOUNTER — Other Ambulatory Visit: Payer: Self-pay | Admitting: Urgent Care

## 2016-02-10 DIAGNOSIS — K4090 Unilateral inguinal hernia, without obstruction or gangrene, not specified as recurrent: Secondary | ICD-10-CM

## 2016-03-12 ENCOUNTER — Encounter: Payer: Self-pay | Admitting: Family

## 2016-03-12 ENCOUNTER — Ambulatory Visit (INDEPENDENT_AMBULATORY_CARE_PROVIDER_SITE_OTHER): Payer: BC Managed Care – PPO | Admitting: Family

## 2016-03-12 VITALS — BP 120/62 | HR 82 | Temp 98.5°F | Resp 16 | Ht 67.0 in | Wt 230.0 lb

## 2016-03-12 DIAGNOSIS — K4090 Unilateral inguinal hernia, without obstruction or gangrene, not specified as recurrent: Secondary | ICD-10-CM | POA: Diagnosis not present

## 2016-03-12 DIAGNOSIS — E119 Type 2 diabetes mellitus without complications: Secondary | ICD-10-CM

## 2016-03-12 DIAGNOSIS — I10 Essential (primary) hypertension: Secondary | ICD-10-CM

## 2016-03-12 NOTE — Patient Instructions (Signed)
Thank you for choosing [facility].  SUMMARY AND INSTRUCTIONS:  Medication:  Please continue to take your medications as prescribed.   Follow up:  If your symptoms worsen or fail to improve, please contact our office for further instruction, or in case of emergency go directly to the emergency room at the closest medical facility.     Inguinal Hernia, Adult Introduction An inguinal hernia is when fat or the intestines push through the area where the leg meets the lower belly (groin) and make a rounded lump (bulge). This condition happens over time. There are three types of inguinal hernias. These types include:  Hernias that can be pushed back into the belly (are reducible).  Hernias that cannot be pushed back into the belly (are incarcerated).  Hernias that cannot be pushed back into the belly and lose their blood supply (get strangulated). This type needs emergency surgery. Follow these instructions at home: Lifestyle  Drink enough fluid to keep your urine (pee) clear or pale yellow.  Eat plenty of fruits, vegetables, and whole grains. These have a lot of fiber. Talk with your doctor if you have questions.  Avoid lifting heavy objects.  Avoid standing for long periods of time.  Do not use tobacco products. These include cigarettes, chewing tobacco, or e-cigarettes. If you need help quitting, ask your doctor.  Try to stay at a healthy weight. General instructions  Do not try to force the hernia back in.  Watch your hernia for any changes in color or size. Let your doctor know if there are any changes.  Take over-the-counter and prescription medicines only as told by your doctor.  Keep all follow-up visits as told by your doctor. This is important. Contact a doctor if:  You have a fever.  You have new symptoms.  Your symptoms get worse. Get help right away if:  The area where the legs meets the lower belly has:  Pain that gets worse suddenly.  A bulge  that gets bigger suddenly and does not go down.  A bulge that turns red or purple.  A bulge that is painful to the touch.  You are a man and your scrotum:  Suddenly feels painful.  Suddenly changes in size.  You feel sick to your stomach (nauseous) and this feeling does not go away.  You throw up (vomit) and this keeps happening.  You feel your heart beating a lot more quickly than normal.  You cannot poop (have a bowel movement) or pass gas. This information is not intended to replace advice given to you by your health care provider. Make sure you discuss any questions you have with your health care provider. Document Released: 04/09/2006 Document Revised: 08/15/2015 Document Reviewed: 01/17/2014  2017 Elsevier

## 2016-03-12 NOTE — Assessment & Plan Note (Signed)
Diabetes appears medically controlled with current regimen with most recent A1c of 5.7. All diabetic prevention is up-to-date per recommendations. Not currently maintained on Ace/ARB or statin for CAD risk reduction. Continue current dosage of Xigudo. Monitor blood pressures at home 1-3 times per week. Follow-up in 6 months or sooner if needed.

## 2016-03-12 NOTE — Assessment & Plan Note (Signed)
Blood pressure appears well controlled with current regimen and below goal 140/90. No adverse side effects. Denies worse headache of life with no new symptoms of end organ damage noted on physical exam. Continue current dosage of amlodipine. Encouraged to monitor blood pressure at home and follow sodium diet.

## 2016-03-12 NOTE — Assessment & Plan Note (Signed)
Recently diagnosed with left inguinal hernia and followed up with general surgery with the recommendations of possible repair. Not currently experiencing symptoms. Discussed watchful waiting versus surgical repair. Patient will contemplate and follow-up with general surgery as needed.

## 2016-03-12 NOTE — Progress Notes (Signed)
Subjective:    Patient ID: Austin Valencia., male    DOB: [birth year], 50 y.o.   MRN: [id number]  Chief Complaint  Patient presents with  . Follow-up    diabetes, wants you to know he has an inguinal hernia that was found when he went to the ED recently    HPI:  Austin Valencia. is a 50 y.o. male who  has a past medical history of Diabetes mellitus without complication ([place]); GERD (gastroesophageal reflux disease); Headache(784.0); Hyperlipidemia; and Hypertension. and presents today for a follow up office visit.   1.) Type 2 diabetes - Currently maintained on Xigduo. Reports taking the medication as prescribed and denies adverse side effects or hypoglycemic events. Blood sugars at home have been averaging around the 120s . All recommended diabetic screening is up to date.   Lab Results  Component Value Date   HGBA1C 5.7 02/07/2016    2.) Hypertension - Currently maintained amlodipine. Reports taking the medication as prescribed and denies adverse side effects or hypotensive readings. Denies worst headache of life with no new symptoms of end organ damage.  BP Readings from Last 3 Encounters:  03/12/16 120/62  02/07/16 110/62  01/27/16 140/82    3.) Hernia - This is a new problem. Associated symptom of an inguinal hernia was noted during a CT scan and has been followed up by General Surgery with the recommendation for watchful waiting or possible repair. Not currently experiencing pain but does have occasional pains that with increased chest pressure.    Allergies  Allergen Reactions  . 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 Ace Inhibitors     See 05/23/14 angioedema with Losartan      Outpatient Medications Prior to Visit  Medication Sig Dispense Refill  . alfuzosin (UROXATRAL) 10 MG 24 hr tablet Take 10 mg by mouth daily with breakfast.    . amLODipine (NORVASC) 5 MG tablet Take 1 tablet (5 mg total) by mouth daily. 90 tablet 1  . Dapagliflozin-Metformin HCl  ER (XIGDUO XR) 07-998 MG TB24 Take 10 mg by mouth every morning. Take 2 tabs with breakfast daily 60 tablet 11  . fenofibrate (TRICOR) 145 MG tablet Take 1 tablet (145 mg total) by mouth daily. 30 tablet 11  . omeprazole (PRILOSEC) 20 MG capsule Take 20 mg by mouth daily.    . simvastatin (ZOCOR) 10 MG tablet Take 1 tablet (10 mg total) by mouth at bedtime. (Patient not taking: Reported on 03/12/2016) 90 tablet 1   No facility-administered medications prior to visit.      Past Surgical History:  Procedure Laterality Date  . HERNIA REPAIR     umbilical child  . LIPOMA EXCISION Right 11/29/2013   Procedure: EXCISION LIPOMA RIGHT AXILLA;  Surgeon: Autumn Messing III, MD;  Location: [facility];  Service: General;  Laterality: Right;     Past Medical History:  Diagnosis Date  . Diabetes mellitus without complication ([place])   . GERD (gastroesophageal reflux disease)   . Headache(784.0)   . Hyperlipidemia   . Hypertension      Review of Systems  Constitutional: Negative for chills and fever.  Eyes:       Negative for changes in vision  Respiratory: Negative for cough, chest tightness, shortness of breath and wheezing.   Cardiovascular: Negative for chest pain, palpitations and leg swelling.  Endocrine: Negative for polydipsia, polyphagia and polyuria.  Neurological: Negative for dizziness, weakness, light-headedness and headaches.  Objective:    BP 120/62 (BP Location: Left Arm, Patient Position: Sitting, Cuff Size: Large)   Pulse 82   Temp 98.5 F (36.9 C) (Oral)   Resp 16   Ht 5\' 7"  (1.702 m)   Wt 230 lb (104.3 kg)   SpO2 95%   BMI 36.02 kg/m  Nursing note and vital signs reviewed.  Physical Exam  Constitutional: He is oriented to person, place, and time. He appears well-developed and well-nourished. No distress.  Cardiovascular: Normal rate, regular rhythm, normal heart sounds and intact distal pulses.   Pulmonary/Chest: Effort normal and breath sounds  normal.  Abdominal: A hernia is present. Hernia confirmed positive in the left inguinal area.  Neurological: He is alert and oriented to person, place, and time.  Skin: Skin is warm and dry.  Psychiatric: He has a normal mood and affect. His behavior is normal. Judgment and thought content normal.       Assessment & Plan:   Problem List Items Addressed This Visit      Cardiovascular and Mediastinum   Essential hypertension    Blood pressure appears well controlled with current regimen and below goal 140/90. No adverse side effects. Denies worse headache of life with no new symptoms of end organ damage noted on physical exam. Continue current dosage of amlodipine. Encouraged to monitor blood pressure at home and follow sodium diet.        Endocrine   Controlled type 2 diabetes mellitus ([place]) - Primary    Diabetes appears medically controlled with current regimen with most recent A1c of 5.7. All diabetic prevention is up-to-date per recommendations. Not currently maintained on Ace/ARB or statin for CAD risk reduction. Continue current dosage of Xigudo. Monitor blood pressures at home 1-3 times per week. Follow-up in 6 months or sooner if needed.        Other   Left inguinal hernia    Recently diagnosed with left inguinal hernia and followed up with general surgery with the recommendations of possible repair. Not currently experiencing symptoms. Discussed watchful waiting versus surgical repair. Patient will contemplate and follow-up with general surgery as needed.          I have discontinued Mr. Pirani's simvastatin. I am also having him maintain his omeprazole, amLODipine, Dapagliflozin-Metformin HCl ER, fenofibrate, and alfuzosin.   Follow-up: Return if symptoms worsen or fail to improve.  Austin Po, FNP

## 2016-06-09 ENCOUNTER — Ambulatory Visit: Payer: BC Managed Care – PPO | Admitting: Family

## 2016-06-12 ENCOUNTER — Ambulatory Visit (INDEPENDENT_AMBULATORY_CARE_PROVIDER_SITE_OTHER): Payer: BC Managed Care – PPO | Admitting: Family Medicine

## 2016-06-12 ENCOUNTER — Ambulatory Visit (INDEPENDENT_AMBULATORY_CARE_PROVIDER_SITE_OTHER): Payer: BC Managed Care – PPO

## 2016-06-12 VITALS — BP 136/81 | HR 88 | Temp 98.2°F | Resp 18 | Ht 67.0 in | Wt 236.0 lb

## 2016-06-12 DIAGNOSIS — N183 Chronic kidney disease, stage 3 (moderate): Secondary | ICD-10-CM

## 2016-06-12 DIAGNOSIS — R1032 Left lower quadrant pain: Secondary | ICD-10-CM

## 2016-06-12 DIAGNOSIS — R1012 Left upper quadrant pain: Secondary | ICD-10-CM | POA: Diagnosis not present

## 2016-06-12 DIAGNOSIS — E1122 Type 2 diabetes mellitus with diabetic chronic kidney disease: Secondary | ICD-10-CM | POA: Diagnosis not present

## 2016-06-12 LAB — POCT URINALYSIS DIP (MANUAL ENTRY)
Bilirubin, UA: NEGATIVE
Blood, UA: NEGATIVE
Ketones, POC UA: NEGATIVE
Leukocytes, UA: NEGATIVE
Nitrite, UA: NEGATIVE
Protein Ur, POC: NEGATIVE
SPEC GRAV UA: 1.005 (ref 1.030–1.035)
UROBILINOGEN UA: 0.2 (ref ?–2.0)
pH, UA: 6 (ref 5.0–8.0)

## 2016-06-12 LAB — POC MICROSCOPIC URINALYSIS (UMFC): MUCUS RE: ABSENT

## 2016-06-12 MED ORDER — POLYETHYLENE GLYCOL 3350 17 GM/SCOOP PO POWD: 34.0000 g | Freq: Every day | ORAL | 1 refills | Status: DC

## 2016-06-12 MED ORDER — GLIPIZIDE ER 10 MG PO TB24: 10.0000 mg | ORAL_TABLET | Freq: Every day | ORAL | 0 refills | Status: DC

## 2016-06-12 MED ORDER — BLOOD GLUC METER DISP-STRIPS DEVI: 11 refills | Status: AC

## 2016-06-12 NOTE — Patient Instructions (Addendum)
Hold your xigduo for the next 2 weeks and start miralax generic (polyethylene glycol 3350)  Put 2 doses a capufl of miralax (polyethylene glycol) into 16 oz of any clear, non-carbonated liquid (sugar free sports drink, tea, or water) and drink this within an hour. Continue this daily until you are feeling less bloated and gassy. You can reduce it to 1 dose a day if you prefer.  If the diarrhea occurs soon after starting process without passing a significant stool volume or if you are having any fecal incontinence you should keep going as this may be the initial miralax washing around the larger stools.   Make an appointment asap with your PCP Mr. Elna Breslow to decide what else to use for your sugars. He may want to restart you on the xigduo as your sugars have done very well on this as long as your kidneys are doing better.  Or you may want to just restart on part of it.  In the meantime, I am going to start you on glucotrol which is probably not the best long term medication for you but I think it is the best until we get your abdominal pain to go away as most of the other diabetes medicines have abdominal pain as a side effect.  Do not use with any otc pain medication other than tylenol/acetaminophen - so no aleve, ibuprofen, motrin, advil, etc.  CHeck you blood sugars first thing in the morning and once a day after a meal. If you are getting >200, double up on your glucotrol to take 2 tabs/d and call or come back in here or with Mr. Elna Breslow.  IF you received an x-ray today, you will receive an invoice from [facility]. Please contact [facility] at [phone] with questions or concerns regarding your invoice.   IF you received labwork today, you will receive an invoice from [facility]. Please contact [facility] at [phone] with questions or concerns regarding your invoice.   Our billing staff will not be able to assist you with questions regarding bills from these companies.  You  will be contacted with the lab results as soon as they are available. The fastest way to get your results is to activate your My Chart account. Instructions are located on the last page of this paperwork. If you have not heard from [place] regarding the results in 2 weeks, please contact this office.     Constipation, Adult Constipation is when a person has fewer bowel movements in a week than normal, has difficulty having a bowel movement, or has stools that are dry, hard, or larger than normal. Constipation may be caused by an underlying condition. It may become worse with age if a person takes certain medicines and does not take in enough fluids. Follow these instructions at home: Eating and drinking    Eat foods that have a lot of fiber, such as fresh fruits and vegetables, whole grains, and beans.  Limit foods that are high in fat, low in fiber, or overly processed, such as french fries, hamburgers, cookies, candies, and soda.  Drink enough fluid to keep your urine clear or pale yellow. General instructions   Exercise regularly or as told by your health care provider.  Go to the restroom when you have the urge to go. Do not hold it in.  Take over-the-counter and prescription medicines only as told by your health care provider. These include any fiber supplements.  Practice pelvic floor retraining exercises, such as deep breathing while  relaxing the lower abdomen and pelvic floor relaxation during bowel movements.  Watch your condition for any changes.  Keep all follow-up visits as told by your health care provider. This is important. Contact a health care provider if:  You have pain that gets worse.  You have a fever.  You do not have a bowel movement after 4 days.  You vomit.  You are not hungry.  You lose weight.  You are bleeding from the anus.  You have thin, pencil-like stools. Get help right away if:  You have a fever and your symptoms suddenly get worse.  You  leak stool or have blood in your stool.  Your abdomen is bloated.  You have severe pain in your abdomen.  You feel dizzy or you faint. This information is not intended to replace advice given to you by your health care provider. Make sure you discuss any questions you have with your health care provider. Document Released: 12/06/2003 Document Revised: 09/27/2015 Document Reviewed: 08/28/2015 Elsevier Interactive Patient Education  2017 [employer] Inc[employer]   Chronic Kidney Disease, Adult Chronic kidney disease (CKD) occurs when the kidneys are damaged during a period of 3 or more months. The kidneys are two organs that do many important jobs in the body, which include:  Removing wastes and extra fluids from the blood.  Making hormones that maintain the amount of fluid in your tissues and blood vessels.  Maintaining the right amount of fluids and chemicals in the body. A small amount of kidney damage may not cause problems, but a large amount of damage may make it difficult or impossible for the kidneys to work the way they should. If steps are not taken to slow down the kidney damage or stop it from getting worse, the kidneys may stop working permanently (end stage kidney disease). Most of the time, CKD does not go away, but it can often be controlled. People who have CKD can usually live normal lives. What are the causes? The most common causes of this condition are diabetes and high blood pressure (hypertension). Other causes include:  Heart and blood vessel (cardiovascular) disease.  Kidney diseases:  Glomerulonephritis.  Interstitial nephritis.  Polycystic kidney disease.  Renal vascular disease.  Diseases that affect the immune system.  Genetic diseases.  Medicines that damage the kidneys, such as anti-inflammatory medicines.  Poisoning.  Being around or in contact with poisonous (toxic) substances.  A kidney or urinary infection that occurs again  (recurs).  Vasculitis.  Repeat kidney infections.  A problem with urine flow that may be caused by:  Cancer.  Having kidney stones more than one time.  An enlarged prostate in males. What increases the risk? This condition is more likely to develop in people who are:  Older than age 42.  Male.  Of African-American descent.  Current smokers or former smokers.  Obese. You may also have an increased risk for CKD if you have a family history of CKDor youfrequently take medicines that are damaging to the kidneys. What are the signs or symptoms? Symptoms develop slowly and may not be obvious until the kidney damage becomes severe. It is possible to have a kidney disease for years without showing any symptoms. Symptoms of this condition can include:  Swelling (edema) of the face, legs, ankles, or feet.  Numbness, tingling, or loss of feeling (sensation) in the hands or feet.  Tiredness (lethargy).  Nausea or vomiting.  Confusion or trouble concentrating.  Problems with urination, such as:  Painful  or burning feeling during urination.  Decreased urine production.  Frequent urination, especially at night.  Bloody urine.  Muscle twitches and cramps, especially in the legs.  Shortness of breath.  Weakness.  Constant itchiness.  Loss of appetite.  Metallic taste in the mouth.  Trouble sleeping.  Pale lining of the eyelids and surface of the eye (conjunctiva). How is this diagnosed? This condition may be diagnosed with various tests. Tests may include:  Blood tests.  Urine tests.  Imaging tests.  A test in which a sample of tissue is removed from the kidneys to be looked at under a microscope (kidney biopsy). These test results will help your health care provider determine what class of CKD you have. How is this treated? Most cases of CKD cannot be cured. Treatment usually involves relieving symptoms and preventing or slowing the progression of the  disease. Treatment may include:  A special diet, which may require you to avoid alcohol, salty foods (sodium), and foods that are high in potassium, calcium, and protein.  Medicines:  To lower blood pressure.  To relieve low blood count (anemia).  To relieve swelling.  To protect your bones.  To improve the balance of electrolytes in your blood.  Removing toxic waste from the body by using hemodialysis or peritoneal dialysis if the kidneys can no longer do their job (kidney failure).  Management of other conditions that are causing your CKD or making it worse. Follow these instructions at home:  Follow your prescribed diet.  Take over-the-counter and prescription medicines only as told by your health care provider.  Do not take any new medicines unless approved by your health care provider. Many medicines can worsen your kidney damage.  Do not take any vitamin and mineral supplements unless approved by your health care provider. Many nutritional supplements can worsen your kidney damage.  The dose of some medicines that you take may need to be adjusted.  Do not use any tobacco products, such as cigarettes, chewing tobacco, and e-cigarettes. If you need help quitting, ask your health care provider.  Keep all follow-up visits as told by your health care provider. This is important.  Keep track of your blood pressure. Report changes in your blood pressure as told by your health care provider.  Achieve and maintain a healthy weight. If you need help with this, ask your health care provider.  Start or continue an exercise plan. Try to exercise at least 30 minutes a day, 5 days a week.  Stay current with immunizations as told by your health care provider. Where to find more information:  American Association of Kidney Patients: BombTimer.gl  National Kidney Foundation: www.kidney.[place]: https://mathis.com/  [facility]:  www.lifeoptions.org and www.kidneyschool.org Contact a health care provider if:  Your symptoms get worse.  You develop new symptoms. Get help right away if:  You develop symptoms of end-stage kidney disease, which include:  Headaches.  Abnormally dark or light skin.  Numbness in the hands or feet.  Easy bruising.  Frequent hiccups.  Chest pain.  Shortness of breath.  End of menstruation in women.  You have a fever.  You have decreased urine production.  You have pain or bleeding when you urinate. This information is not intended to replace advice given to you by your health care provider. Make sure you discuss any questions you have with your health care provider. Document Released: 12/17/2007 Document Revised: 08/15/2015 Document Reviewed: 11/06/2011 Elsevier Interactive Patient Education  2017  [employer].
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
## 2016-06-12 NOTE — Progress Notes (Deleted)
   Subjective:    Patient ID: Austin Valencia., male    DOB: 04/09/1965, 51 y.o.   MRN: [id number]  HPI    Review of Systems     Objective:   Physical Exam    BP 136/81   Pulse 88   Temp 98.2 F (36.8 C) (Oral)   Resp 18   Ht 5\' 7"  (1.702 m)   Wt 236 lb (107 kg)   SpO2 92%   BMI 36.96 kg/m      Assessment & Plan:

## 2016-06-13 LAB — CBC WITH DIFFERENTIAL/PLATELET
BASOS: 0 %
Basophils Absolute: 0 10*3/uL (ref 0.0–0.2)
EOS (ABSOLUTE): 0.1 10*3/uL (ref 0.0–0.4)
Eos: 2 %
HEMOGLOBIN: 14.6 g/dL (ref 13.0–17.7)
Hematocrit: 44 % (ref 37.5–51.0)
IMMATURE GRANS (ABS): 0 10*3/uL (ref 0.0–0.1)
IMMATURE GRANULOCYTES: 0 %
LYMPHS: 24 %
Lymphocytes Absolute: 1.2 10*3/uL (ref 0.7–3.1)
MCH: 31.7 pg (ref 26.6–33.0)
MCHC: 33.2 g/dL (ref 31.5–35.7)
MCV: 96 fL (ref 79–97)
MONOCYTES: 7 %
Monocytes Absolute: 0.4 10*3/uL (ref 0.1–0.9)
NEUTROS PCT: 67 %
Neutrophils Absolute: 3.2 10*3/uL (ref 1.4–7.0)
PLATELETS: 213 10*3/uL (ref 150–379)
RBC: 4.6 x10E6/uL (ref 4.14–5.80)
RDW: 13.5 % (ref 12.3–15.4)
WBC: 4.9 10*3/uL (ref 3.4–10.8)

## 2016-06-13 LAB — COMPREHENSIVE METABOLIC PANEL
A/G RATIO: 1.9 (ref 1.2–2.2)
ALBUMIN: 4.4 g/dL (ref 3.5–5.5)
ALT: 22 IU/L (ref 0–44)
AST: 15 IU/L (ref 0–40)
Alkaline Phosphatase: 85 IU/L (ref 39–117)
BUN/Creatinine Ratio: 8 — ABNORMAL LOW (ref 9–20)
BUN: 12 mg/dL (ref 6–24)
Bilirubin Total: 0.4 mg/dL (ref 0.0–1.2)
CALCIUM: 9.2 mg/dL (ref 8.7–10.2)
CHLORIDE: 96 mmol/L (ref 96–106)
CO2: 26 mmol/L (ref 18–29)
Creatinine, Ser: 1.6 mg/dL — ABNORMAL HIGH (ref 0.76–1.27)
GFR calc Af Amer: 58 mL/min/{1.73_m2} — ABNORMAL LOW (ref >59)
GFR, EST NON AFRICAN AMERICAN: 50 mL/min/{1.73_m2} — AB (ref >59)
GLUCOSE: 333 mg/dL — AB (ref 65–99)
Globulin, Total: 2.3 g/dL (ref 1.5–4.5)
POTASSIUM: 4.4 mmol/L (ref 3.5–5.2)
Sodium: 137 mmol/L (ref 134–144)
TOTAL PROTEIN: 6.7 g/dL (ref 6.0–8.5)

## 2016-06-13 LAB — LIPASE: LIPASE: 43 U/L (ref 13–78)

## 2016-06-13 LAB — HGB A1C W/O EAG: Hgb A1c MFr Bld: 7.5 % — ABNORMAL HIGH (ref 4.8–5.6)

## 2016-06-26 ENCOUNTER — Ambulatory Visit (INDEPENDENT_AMBULATORY_CARE_PROVIDER_SITE_OTHER): Payer: BC Managed Care – PPO | Admitting: Family

## 2016-06-26 ENCOUNTER — Encounter: Payer: Self-pay | Admitting: Family

## 2016-06-26 DIAGNOSIS — E119 Type 2 diabetes mellitus without complications: Secondary | ICD-10-CM | POA: Diagnosis not present

## 2016-06-26 MED ORDER — DULAGLUTIDE 0.75 MG/0.5ML ~~LOC~~ SOAJ: 0.7500 mg | SUBCUTANEOUS | 0 refills | Status: DC

## 2016-06-26 NOTE — Progress Notes (Signed)
Subjective:    Patient ID: Austin Roche., male    DOB: 07-22-1965, 51 y.o.   MRN: [id number]  Chief Complaint  Patient presents with  . Follow-up    diabetes    HPI:  Austin Tipler. is a 51 y.o. male who  has a past medical history of Diabetes mellitus without complication ([place]); GERD (gastroesophageal reflux disease); Headache(784.0); Hyperlipidemia; and Hypertension. and presents today for an office follow up.  Diabetes - Currently maintained on glipizide.  Reports taking the medication as prescribed and denies adverse side effects or hypoglycemic readings. Blood sugars at home have been elevated since starting the glipizide in the 300's. Recently taken off Xigduo with concerns for abdominal pain. Denies new symptoms of end organ damage. No excessive hunger, thirst, or urination. Working on a low/carbohydrate modified oral intake.   Lab Results  Component Value Date   HGBA1C 7.5 (H) 06/12/2016     Lab Results  Component Value Date   CREATININE 1.60 (H) 06/12/2016   BUN 12 06/12/2016   NA 137 06/12/2016   K 4.4 06/12/2016   CL 96 06/12/2016   CO2 26 06/12/2016       Allergies  Allergen Reactions  . 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 Ace Inhibitors     See 05/23/14 angioedema with Losartan      Outpatient Medications Prior to Visit  Medication Sig Dispense Refill  . alfuzosin (UROXATRAL) 10 MG 24 hr tablet Take 10 mg by mouth daily with breakfast.    . amLODipine (NORVASC) 5 MG tablet Take 1 tablet (5 mg total) by mouth daily. 90 tablet 1  . Blood Gluc Meter Disp-Strips (BLOOD GLUCOSE METER DISPOSABLE) DEVI Please given strips of type to work w/ pt's meter. Needs to check tid due to medication change, hyperglycemia 100 each 11  . fenofibrate (TRICOR) 145 MG tablet Take 1 tablet (145 mg total) by mouth daily. 30 tablet 11  . glipiZIDE (GLUCOTROL XL) 10 MG 24 hr tablet Take 1 tablet (10 mg total) by mouth daily with breakfast. 30 tablet 0  .  omeprazole (PRILOSEC) 20 MG capsule Take 20 mg by mouth daily.    . polyethylene glycol powder (GLYCOLAX/MIRALAX) powder Take 34 g by mouth daily. 500 g 1   No facility-administered medications prior to visit.      Review of Systems  Eyes:       Negative for changes in vision.  Respiratory: Negative for chest tightness and shortness of breath.   Cardiovascular: Negative for chest pain, palpitations and leg swelling.  Endocrine: Negative for polydipsia, polyphagia and polyuria.  Neurological: Negative for dizziness, weakness, light-headedness and headaches.      Objective:    BP 122/80 (BP Location: Left Arm, Patient Position: Sitting, Cuff Size: Large)   Pulse 68   Temp 98.6 F (37 C) (Oral)   Resp 16   Ht 5\' 7"  (1.702 m)   Wt 229 lb (103.9 kg)   SpO2 96%   BMI 35.87 kg/m  Nursing note and vital signs reviewed.  Physical Exam  Constitutional: He is oriented to person, place, and time. He appears well-developed and well-nourished. No distress.  Cardiovascular: Normal rate, regular rhythm, normal heart sounds and intact distal pulses.   Pulmonary/Chest: Effort normal and breath sounds normal.  Neurological: He is alert and oriented to person, place, and time.  Skin: Skin is warm and dry.  Psychiatric: He has a normal mood and affect. His behavior  is normal. Judgment and thought content normal.       Assessment & Plan:   Problem List Items Addressed This Visit      Endocrine   Controlled type 2 diabetes mellitus ([place])    Worsening control of type 2 diabetes with A1c of 7.5 and home blood sugar readings in the 2-300 with change from previous regimen of Xigduo. Question abdominal pain origin from medication versus constipation. No further evidence of abdominal pain since stopping medication and starting MiraLAX as well. Continue current dosage of glipizide. Start Trulicity. Continue to monitor blood sugars at home and follow low/carbohydrate modified diet. Follow up in 2  weeks with blood sugar numbers.       Relevant Medications   Dulaglutide (TRULICITY) 0.26 VZ/8.5YI SOPN       I am having Austin Valencia start on Dulaglutide. I am also having him maintain his omeprazole, amLODipine, fenofibrate, alfuzosin, glipiZIDE, BLOOD GLUCOSE METER DISPOSABLE, and polyethylene glycol powder.   Meds ordered this encounter  Medications  . Dulaglutide (TRULICITY) 5.02 DX/4.1OI SOPN    Sig: Inject 0.75 mg into the skin once a week.    Dispense:  2 pen    Refill:  0    Order Specific Question:   Supervising Provider    Answer:   Pricilla Holm A [[id number]]     Follow-up: Return in about 2 months (around 08/26/2016), or if symptoms worsen or fail to improve.  Mauricio Po, FNP

## 2016-06-26 NOTE — Patient Instructions (Addendum)
Thank you for choosing [facility].  SUMMARY AND INSTRUCTIONS:  Check on the price of Trulicity or Bydueron.   Continue to take the glipizide.  Follow up in 2 weeks with blood sugar numbers.  Continue to monitor blood sugars at home.   Medication:  Your prescription(s) have been submitted to your pharmacy or been printed and provided for you. Please take as directed and contact our office if you believe you are having problem(s) with the medication(s) or have any questions.  Follow up:  If your symptoms worsen or fail to improve, please contact our office for further instruction, or in case of emergency go directly to the emergency room at the closest medical facility.

## 2016-06-26 NOTE — Assessment & Plan Note (Signed)
Worsening control of type 2 diabetes with A1c of 7.5 and home blood sugar readings in the 2-300 with change from previous regimen of Xigduo. Question abdominal pain origin from medication versus constipation. No further evidence of abdominal pain since stopping medication and starting MiraLAX as well. Continue current dosage of glipizide. Start Trulicity. Continue to monitor blood sugars at home and follow low/carbohydrate modified diet. Follow up in 2 weeks with blood sugar numbers.

## 2016-06-29 ENCOUNTER — Ambulatory Visit: Payer: BC Managed Care – PPO | Admitting: Family

## 2016-07-14 ENCOUNTER — Encounter: Payer: Self-pay | Admitting: Physician Assistant

## 2016-07-14 ENCOUNTER — Ambulatory Visit (INDEPENDENT_AMBULATORY_CARE_PROVIDER_SITE_OTHER): Payer: BC Managed Care – PPO | Admitting: Physician Assistant

## 2016-07-14 ENCOUNTER — Telehealth: Payer: Self-pay | Admitting: Family

## 2016-07-14 VITALS — BP 119/80 | HR 72 | Temp 98.0°F | Resp 18 | Ht 67.32 in | Wt 219.6 lb

## 2016-07-14 DIAGNOSIS — E1165 Type 2 diabetes mellitus with hyperglycemia: Secondary | ICD-10-CM

## 2016-07-14 DIAGNOSIS — E119 Type 2 diabetes mellitus without complications: Secondary | ICD-10-CM

## 2016-07-14 DIAGNOSIS — IMO0001 Reserved for inherently not codable concepts without codable children: Secondary | ICD-10-CM

## 2016-07-14 LAB — POCT URINALYSIS DIP (MANUAL ENTRY)
BILIRUBIN UA: NEGATIVE
BILIRUBIN UA: NEGATIVE mg/dL
Blood, UA: NEGATIVE
Glucose, UA: >=1000 mg/dL — AB
LEUKOCYTES UA: NEGATIVE
NITRITE UA: NEGATIVE
PROTEIN UA: NEGATIVE mg/dL
Spec Grav, UA: 1.01 (ref 1.010–1.025)
Urobilinogen, UA: 0.2 E.U./dL
pH, UA: 5.5 (ref 5.0–8.0)

## 2016-07-14 LAB — GLUCOSE, POCT (MANUAL RESULT ENTRY): POC Glucose: 272 mg/dl — AB (ref 70–99)

## 2016-07-14 MED ORDER — DULAGLUTIDE 0.75 MG/0.5ML ~~LOC~~ SOAJ: 0.7500 mg | SUBCUTANEOUS | 0 refills | Status: DC

## 2016-07-14 NOTE — Progress Notes (Signed)
PRIMARY CARE AT [place], [place] [address] [phone]  Date:  07/14/2016   Name:  Jeyson Deshotel.   DOB:  Oct 14, 1965   MRN:  [id number]  PCP:  Mauricio Po, FNP    History of Present Illness:  Jahki Witham. is a 51 y.o. male patient who presents to PCP with  Chief Complaint  Patient presents with  . Fatigue    pt states - blood sugar was elevated     He felt very fatigue when he woke up this morning.  His blood sugar was 354.   Polyuria, without hematuria or dysuria.  He has polydipsia. Mild nausea earlier, but this has resolved.  No tremulousness. His blood sugar has ranged 275-350 in the last week.  His blood sugar is generally 150 with morning fast.   Patient started taking the xigduo after running out of the trulicity, despite this being discontinued.  He had some leftover  He has been eating late at night this last week, chili cheese fries, and drank apple juice.    Patient Active Problem List   Diagnosis Date Noted  . Left inguinal hernia 03/12/2016  . Controlled type 2 diabetes mellitus ([place]) 06/22/2015  . 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 GERD 03/28/2009  . Hyperlipidemia 02/25/2009  . DEPRESSION 02/25/2009  . Essential hypertension 02/25/2009  . BPH (benign prostatic hyperplasia) 02/25/2009    Past Medical History:  Diagnosis Date  . Diabetes mellitus without complication ([place])   . GERD (gastroesophageal reflux disease)   . Headache(784.0)   . Hyperlipidemia   . Hypertension     Past Surgical History:  Procedure Laterality Date  . HERNIA REPAIR     umbilical child  . LIPOMA EXCISION Right 11/29/2013   Procedure: EXCISION LIPOMA RIGHT AXILLA;  Surgeon: Autumn Messing III, MD;  Location: [facility];  Service: General;  Laterality: Right;    Social History  Substance Use Topics  . Smoking status: Never Smoker  . Smokeless tobacco: Never Used  . Alcohol use No    Family History  Problem Relation Age of Onset  .  Cancer Father     lung  . Diabetes Father   . Hypertension Father   . Hyperlipidemia Mother   . Hypertension Mother     Allergies  Allergen Reactions  . 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 Ace Inhibitors     See 05/23/14 angioedema with Losartan    Medication list has been reviewed and updated.  Current Outpatient Prescriptions on File Prior to Visit  Medication Sig Dispense Refill  . alfuzosin (UROXATRAL) 10 MG 24 hr tablet Take 10 mg by mouth daily with breakfast.    . amLODipine (NORVASC) 5 MG tablet Take 1 tablet (5 mg total) by mouth daily. 90 tablet 1  . Blood Gluc Meter Disp-Strips (BLOOD GLUCOSE METER DISPOSABLE) DEVI Please given strips of type to work w/ pt's meter. Needs to check tid due to medication change, hyperglycemia 100 each 11  . Dulaglutide (TRULICITY) 4.23 NT/6.1WE SOPN Inject 0.75 mg into the skin once a week. 2 pen 0  . fenofibrate (TRICOR) 145 MG tablet Take 1 tablet (145 mg total) by mouth daily. 30 tablet 11  . glipiZIDE (GLUCOTROL XL) 10 MG 24 hr tablet Take 1 tablet (10 mg total) by mouth daily with breakfast. 30 tablet 0  . omeprazole (PRILOSEC) 20 MG capsule Take 20 mg by mouth daily.    . polyethylene  glycol powder (GLYCOLAX/MIRALAX) powder Take 34 g by mouth daily. 500 g 1   No current facility-administered medications on file prior to visit.     ROS ROS otherwise unremarkable unless listed above.  Physical Examination: BP 119/80 (BP Location: Right Arm, Patient Position: Sitting, Cuff Size: Large)   Pulse 72   Temp 98 F (36.7 C) (Oral)   Resp 18   Ht 5' 7.32" (1.71 m)   Wt 219 lb 9.6 oz (99.6 kg)   SpO2 96%   BMI 34.06 kg/m  Ideal Body Weight: Weight in (lb) to have BMI = 25: 160.8  Physical Exam  Constitutional: He is oriented to person, place, and time. He appears well-developed and well-nourished. No distress.  HENT:  Head: Normocephalic and atraumatic.  Eyes: Conjunctivae and EOM are normal. Pupils are equal, round, and  reactive to light.  Cardiovascular: Normal rate and regular rhythm.   No murmur heard. Pulmonary/Chest: Effort normal. No apnea. No respiratory distress. He has no decreased breath sounds. He has no wheezes. He has no rhonchi.  Neurological: He is alert and oriented to person, place, and time.  Skin: Skin is warm and dry. He is not diaphoretic.  Psychiatric: He has a normal mood and affect. His behavior is normal.     Results for orders placed or performed in visit on 07/14/16  POCT urinalysis dipstick  Result Value Ref Range   Color, UA yellow yellow   Clarity, UA clear clear   Glucose, UA >=1,000 (A) negative mg/dL   Bilirubin, UA negative negative   Ketones, POC UA negative negative mg/dL   Spec Grav, UA 1.010 1.010 - 1.025   Blood, UA negative negative   pH, UA 5.5 5.0 - 8.0   Protein Ur, POC negative negative mg/dL   Urobilinogen, UA 0.2 0.2 or 1.0 E.U./dL   Nitrite, UA Negative Negative   Leukocytes, UA Negative Negative  POCT glucose (manual entry)  Result Value Ref Range   POC Glucose 272 (A) 70 - 99 mg/dl    Assessment and Plan: Nisaiah Bechtol. is a 51 y.o. male who is here today for cc of fatigue and high blood sugar found last night. He will restart the trulicity which he has received today from his primary care. Discussed appropriate foods and nutrition. Discussed his central obesity and dm2, as well as his risk of cardiovascular, kidney disease progression.   Advised him to stop the xigduo, and reviewed the choice not to continue this renal burdened drug.  He voiced understanding. Blood sugar elevated but otherwise non-acute.  Glucosuria consistent with restarting the xigduo. Obtaining kidney and liver function Uncontrolled type 2 diabetes mellitus without complication, without long-term current use of insulin ([place]) - Plan: POCT urinalysis dipstick, POCT glucose (manual entry), BMP8+EGFR  Ivar Drape, PA-C [facility] 4/27/20188:22 AM

## 2016-07-14 NOTE — Patient Instructions (Addendum)
I am obtaining your kidney function and electrolytes.  It is trimming down.   I would like you to take the trulicity as prescribed weekly. You need to allow 2-3 hours eating prior to bedtime. Avoid sugary foods, and foods that turn into sugar.  Such as, breads, pastas, rices, potatoes, artificial drinks (sodas, sweet teas, juices).  Do not drink natural juices at this time as well. If you eat fruits, eat the ones that end in -berry; strawberry or blueberry. If there is an issue with obtaining the trulicity you need to let us know right away.   Please review diabetic diet below. Diabetes Mellitus and Food It is important for you to manage your blood sugar (glucose) level. Your blood glucose level can be greatly affected by what you eat. Eating healthier foods in the appropriate amounts throughout the day at about the same time each day will help you control your blood glucose level. It can also help slow or prevent worsening of your diabetes mellitus. Healthy eating may even help you improve the level of your blood pressure and reach or maintain a healthy weight. General recommendations for healthful eating and cooking habits include:  Eating meals and snacks regularly. Avoid going long periods of time without eating to lose weight.  Eating a diet that consists mainly of plant-based foods, such as fruits, vegetables, nuts, legumes, and whole grains.  Using low-heat cooking methods, such as baking, instead of high-heat cooking methods, such as deep frying. Work with your dietitian to make sure you understand how to use the Nutrition Facts information on food labels. How can food affect me? Carbohydrates  Carbohydrates affect your blood glucose level more than any other type of food. Your dietitian will help you determine how many carbohydrates to eat at each meal and teach you how to count carbohydrates. Counting carbohydrates is important to keep your blood glucose at a healthy level, especially  if you are using insulin or taking certain medicines for diabetes mellitus. Alcohol  Alcohol can cause sudden decreases in blood glucose (hypoglycemia), especially if you use insulin or take certain medicines for diabetes mellitus. Hypoglycemia can be a life-threatening condition. Symptoms of hypoglycemia (sleepiness, dizziness, and disorientation) are similar to symptoms of having too much alcohol. If your health care provider has given you approval to drink alcohol, do so in moderation and use the following guidelines:  Women should not have more than one drink per day, and men should not have more than two drinks per day. One drink is equal to:  12 oz of beer.  5 oz of wine.  1 oz of hard liquor.  Do not drink on an empty stomach.  Keep yourself hydrated. Have water, diet soda, or unsweetened iced tea.  Regular soda, juice, and other mixers might contain a lot of carbohydrates and should be counted. What foods are not recommended? As you make food choices, it is important to remember that all foods are not the same. Some foods have fewer nutrients per serving than other foods, even though they might have the same number of calories or carbohydrates. It is difficult to get your body what it needs when you eat foods with fewer nutrients. Examples of foods that you should avoid that are high in calories and carbohydrates but low in nutrients include:  Trans fats (most processed foods list trans fats on the Nutrition Facts label).  Regular soda.  Juice.  Candy.  Sweets, such as cake, pie, doughnuts, and cookies.  Fried foods.  What foods can I eat? Eat nutrient-rich foods, which will nourish your body and keep you healthy. The food you should eat also will depend on several factors, including:  The calories you need.  The medicines you take.  Your weight.  Your blood glucose level.  Your blood pressure level.  Your cholesterol level. You should eat a variety of foods,  including:  Protein.  Lean cuts of meat.  Proteins low in saturated fats, such as fish, egg whites, and beans. Avoid processed meats.  Fruits and vegetables.  Fruits and vegetables that may help control blood glucose levels, such as apples, mangoes, and yams.  Dairy products.  Choose fat-free or low-fat dairy products, such as milk, yogurt, and cheese.  Grains, bread, pasta, and rice.  Choose whole grain products, such as multigrain bread, whole oats, and brown rice. These foods may help control blood pressure.  Fats.  Foods containing healthful fats, such as nuts, avocado, olive oil, canola oil, and fish. Does everyone with diabetes mellitus have the same meal plan? Because every person with diabetes mellitus is different, there is not one meal plan that works for everyone. It is very important that you meet with a dietitian who will help you create a meal plan that is just right for you. This information is not intended to replace advice given to you by your health care provider. Make sure you discuss any questions you have with your health care provider. Document Released: 12/04/2004 Document Revised: 08/15/2015 Document Reviewed: 02/03/2013 Elsevier Interactive Patient Education  2017 [employer].   Diabetes Mellitus and Exercise Exercising regularly is important for your overall health, especially when you have diabetes (diabetes mellitus). Exercising is not only about losing weight. It has many health benefits, such as increasing muscle strength and bone density and reducing body fat and stress. This leads to improved fitness, flexibility, and endurance, all of which result in better overall health. Exercise has additional benefits for people with diabetes, including:  Reducing appetite.  Helping to lower and control blood glucose.  Lowering blood pressure.  Helping to control amounts of fatty substances (lipids) in the blood, such as cholesterol and  triglycerides.  Helping the body to respond better to insulin (improving insulin sensitivity).  Reducing how much insulin the body needs.  Decreasing the risk for heart disease by:  Lowering cholesterol and triglyceride levels.  Increasing the levels of good cholesterol.  Lowering blood glucose levels. What is my activity plan? Your health care provider or certified diabetes educator can help you make a plan for the type and frequency of exercise (activity plan) that works for you. Make sure that you:  Do at least 150 minutes of moderate-intensity or vigorous-intensity exercise each week. This could be brisk walking, biking, or water aerobics.  Do stretching and strength exercises, such as yoga or weightlifting, at least 2 times a week.  Spread out your activity over at least 3 days of the week.  Get some form of physical activity every day.  Do not go more than 2 days in a row without some kind of physical activity.  Avoid being inactive for more than 90 minutes at a time. Take frequent breaks to walk or stretch.  Choose a type of exercise or activity that you enjoy, and set realistic goals.  Start slowly, and gradually increase the intensity of your exercise over time. What do I need to know about managing my diabetes?  Check your blood glucose before and after exercising.  If  your blood glucose is higher than 240 mg/dL (13.3 mmol/L) before you exercise, check your urine for ketones. If you have ketones in your urine, do not exercise until your blood glucose returns to normal.  Know the symptoms of low blood glucose (hypoglycemia) and how to treat it. Your risk for hypoglycemia increases during and after exercise. Common symptoms of hypoglycemia can include:  Hunger.  Anxiety.  Sweating and feeling clammy.  Confusion.  Dizziness or feeling light-headed.  Increased heart rate or palpitations.  Blurry vision.  Tingling or numbness around the mouth, lips, or  tongue.  Tremors or shakes.  Irritability.  Keep a rapid-acting carbohydrate snack available before, during, and after exercise to help prevent or treat hypoglycemia.  Avoid injecting insulin into areas of the body that are going to be exercised. For example, avoid injecting insulin into:  The arms, when playing tennis.  The legs, when jogging.  Keep records of your exercise habits. Doing this can help you and your health care provider adjust your diabetes management plan as needed. Write down:  Food that you eat before and after you exercise.  Blood glucose levels before and after you exercise.  The type and amount of exercise you have done.  When your insulin is expected to peak, if you use insulin. Avoid exercising at times when your insulin is peaking.  When you start a new exercise or activity, work with your health care provider to make sure the activity is safe for you, and to adjust your insulin, medicines, or food intake as needed.  Drink plenty of water while you exercise to prevent dehydration or heat stroke. Drink enough fluid to keep your urine clear or pale yellow. This information is not intended to replace advice given to you by your health care provider. Make sure you discuss any questions you have with your health care provider. Document Released: 05/30/2003 Document Revised: 09/27/2015 Document Reviewed: 08/19/2015 Elsevier Interactive Patient Education  2017 [employer].   IF you received an x-ray today, you will receive an invoice from [facility]. Please contact [facility] at [phone] with questions or concerns regarding your invoice.   IF you received labwork today, you will receive an invoice from [facility]. Please contact [facility] at [phone] with questions or concerns regarding your invoice.   Our billing staff will not be able to assist you with questions regarding bills from these companies.  You will be contacted with the  lab results as soon as they are available. The fastest way to get your results is to activate your My Chart account. Instructions are located on the last page of this paperwork. If you have not heard from [place] regarding the results in 2 weeks, please contact this office.

## 2016-07-14 NOTE — Telephone Encounter (Signed)
Rx sent 

## 2016-07-14 NOTE — Telephone Encounter (Signed)
Pt came by and needs script for Dulaglutide (TRULICITY) 8.67 JQ/4.9EE SOPN [[id number]]  Sent into [facility] on [address].  If it was sent in on 4/6 he never went and picked that up.  Pt rec'd samples on his visit on 4/6

## 2016-07-15 LAB — BMP8+EGFR
BUN/Creatinine Ratio: 8 — ABNORMAL LOW (ref 9–20)
BUN: 12 mg/dL (ref 6–24)
CALCIUM: 9.9 mg/dL (ref 8.7–10.2)
CO2: 21 mmol/L (ref 18–29)
Chloride: 97 mmol/L (ref 96–106)
Creatinine, Ser: 1.57 mg/dL — ABNORMAL HIGH (ref 0.76–1.27)
GFR calc Af Amer: 59 mL/min/{1.73_m2} — ABNORMAL LOW (ref >59)
GFR, EST NON AFRICAN AMERICAN: 51 mL/min/{1.73_m2} — AB (ref >59)
Glucose: 263 mg/dL — ABNORMAL HIGH (ref 65–99)
Potassium: 4.3 mmol/L (ref 3.5–5.2)
SODIUM: 140 mmol/L (ref 134–144)

## 2016-07-17 ENCOUNTER — Other Ambulatory Visit: Payer: Self-pay

## 2016-07-17 DIAGNOSIS — E119 Type 2 diabetes mellitus without complications: Secondary | ICD-10-CM

## 2016-07-17 MED ORDER — DULAGLUTIDE 0.75 MG/0.5ML ~~LOC~~ SOAJ: 0.7500 mg | SUBCUTANEOUS | 2 refills | Status: DC

## 2016-08-12 ENCOUNTER — Ambulatory Visit: Payer: BC Managed Care – PPO | Admitting: Physician Assistant

## 2016-08-13 ENCOUNTER — Encounter: Payer: Self-pay | Admitting: Physician Assistant

## 2016-08-13 ENCOUNTER — Ambulatory Visit (INDEPENDENT_AMBULATORY_CARE_PROVIDER_SITE_OTHER): Payer: BC Managed Care – PPO | Admitting: Physician Assistant

## 2016-08-13 VITALS — BP 120/77 | HR 73 | Resp 16 | Wt 207.0 lb

## 2016-08-13 DIAGNOSIS — IMO0001 Reserved for inherently not codable concepts without codable children: Secondary | ICD-10-CM

## 2016-08-13 DIAGNOSIS — E1165 Type 2 diabetes mellitus with hyperglycemia: Secondary | ICD-10-CM

## 2016-08-13 LAB — BASIC METABOLIC PANEL
BUN/Creatinine Ratio: 10 (ref 9–20)
BUN: 14 mg/dL (ref 6–24)
CALCIUM: 9 mg/dL (ref 8.7–10.2)
CO2: 21 mmol/L (ref 18–29)
CREATININE: 1.35 mg/dL — AB (ref 0.76–1.27)
Chloride: 96 mmol/L (ref 96–106)
GFR calc non Af Amer: 62 mL/min/{1.73_m2} (ref >59)
GFR, EST AFRICAN AMERICAN: 71 mL/min/{1.73_m2} (ref >59)
GLUCOSE: 419 mg/dL — AB (ref 65–99)
POTASSIUM: 4.1 mmol/L (ref 3.5–5.2)
Sodium: 132 mmol/L — ABNORMAL LOW (ref 134–144)

## 2016-08-13 NOTE — Patient Instructions (Addendum)
Please await contact for the dietician referral. Sounds like you are doing a  Good job.  To review, watch your fruit intake.  Eating fruits that end in -berry, is a good rule of thumb, and you can pair a fruit with protein/dairy like cottage cheese, and other cheeses.  Avoid breads, pastas, and rices.  No sodas.  No grapes or bananas.  If you do eat grapes, keep it less than a handful.   Continue to do you walking, that sounds great. And you can try Myfitnesspal to log your caloric intake and help lose weight.   Keep up the good work!! Diabetes Mellitus and Food It is important for you to manage your blood sugar (glucose) level. Your blood glucose level can be greatly affected by what you eat. Eating healthier foods in the appropriate amounts throughout the day at about the same time each day will help you control your blood glucose level. It can also help slow or prevent worsening of your diabetes mellitus. Healthy eating may even help you improve the level of your blood pressure and reach or maintain a healthy weight. General recommendations for healthful eating and cooking habits include:  Eating meals and snacks regularly. Avoid going long periods of time without eating to lose weight.  Eating a diet that consists mainly of plant-based foods, such as fruits, vegetables, nuts, legumes, and whole grains.  Using low-heat cooking methods, such as baking, instead of high-heat cooking methods, such as deep frying. Work with your dietitian to make sure you understand how to use the Nutrition Facts information on food labels. How can food affect me? Carbohydrates  Carbohydrates affect your blood glucose level more than any other type of food. Your dietitian will help you determine how many carbohydrates to eat at each meal and teach you how to count carbohydrates. Counting carbohydrates is important to keep your blood glucose at a healthy level, especially if you are using insulin or taking certain  medicines for diabetes mellitus. Alcohol  Alcohol can cause sudden decreases in blood glucose (hypoglycemia), especially if you use insulin or take certain medicines for diabetes mellitus. Hypoglycemia can be a life-threatening condition. Symptoms of hypoglycemia (sleepiness, dizziness, and disorientation) are similar to symptoms of having too much alcohol. If your health care provider has given you approval to drink alcohol, do so in moderation and use the following guidelines:  Women should not have more than one drink per day, and men should not have more than two drinks per day. One drink is equal to:  12 oz of beer.  5 oz of wine.  1 oz of hard liquor.  Do not drink on an empty stomach.  Keep yourself hydrated. Have water, diet soda, or unsweetened iced tea.  Regular soda, juice, and other mixers might contain a lot of carbohydrates and should be counted. What foods are not recommended? As you make food choices, it is important to remember that all foods are not the same. Some foods have fewer nutrients per serving than other foods, even though they might have the same number of calories or carbohydrates. It is difficult to get your body what it needs when you eat foods with fewer nutrients. Examples of foods that you should avoid that are high in calories and carbohydrates but low in nutrients include:  Trans fats (most processed foods list trans fats on the Nutrition Facts label).  Regular soda.  Juice.  Candy.  Sweets, such as cake, pie, doughnuts, and cookies.  Fried foods.  What foods can I eat? Eat nutrient-rich foods, which will nourish your body and keep you healthy. The food you should eat also will depend on several factors, including:  The calories you need.  The medicines you take.  Your weight.  Your blood glucose level.  Your blood pressure level.  Your cholesterol level. You should eat a variety of foods, including:  Protein.  Lean cuts of  meat.  Proteins low in saturated fats, such as fish, egg whites, and beans. Avoid processed meats.  Fruits and vegetables.  Fruits and vegetables that may help control blood glucose levels, such as apples, mangoes, and yams.  Dairy products.  Choose fat-free or low-fat dairy products, such as milk, yogurt, and cheese.  Grains, bread, pasta, and rice.  Choose whole grain products, such as multigrain bread, whole oats, and brown rice. These foods may help control blood pressure.  Fats.  Foods containing healthful fats, such as nuts, avocado, olive oil, canola oil, and fish. Does everyone with diabetes mellitus have the same meal plan? Because every person with diabetes mellitus is different, there is not one meal plan that works for everyone. It is very important that you meet with a dietitian who will help you create a meal plan that is just right for you. This information is not intended to replace advice given to you by your health care provider. Make sure you discuss any questions you have with your health care provider. Document Released: 12/04/2004 Document Revised: 08/15/2015 Document Reviewed: 02/03/2013 Elsevier Interactive Patient Education  2017 [employer].    IF you received an x-ray today, you will receive an invoice from [facility]. Please contact [facility] Radiology at [phone] with questions or concerns regarding your invoice.   IF you received labwork today, you will receive an invoice from [place]. Please contact [facility] at [phone] with questions or concerns regarding your invoice.   Our billing staff will not be able to assist you with questions regarding bills from these companies.  You will be contacted with the lab results as soon as they are available. The fastest way to get your results is to activate your My Chart account. Instructions are located on the last page of this paperwork. If you have not heard from [place] regarding the results in  2 weeks, please contact this office.

## 2016-08-13 NOTE — Progress Notes (Signed)
PRIMARY CARE AT [place], [place] [address] [phone]  Date:  08/13/2016   Name:  Austin Valencia.   DOB:  1965-09-24   MRN:  [id number]  PCP:  [place], FNP    History of Present Illness:  Austin Sigmund. is a 51 y.o. male patient who presents to PCP with  Chief Complaint  Patient presents with  . Diabetes    check diabetes     --patient reports compliance over the last 4 weeks of using the trulicity.  He is administering the medication on Mondays each week.  Uses locations such as the outer arms, thigh, and abdomen. --morning fast glucose of 196-217 --He is walking every morning for 30 minutes --cutting down processed foods, breads.  Eating a lot of fruit such as watermelon, bananas, and grapes.   --no blurry vision, palpitaitons,  Diarrhea, abdominal pain, or fatigue.     Patient Active Problem List   Diagnosis Date Noted  . Left inguinal hernia 03/12/2016  . Controlled type 2 diabetes mellitus ([place]) 06/22/2015  . 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 GERD 03/28/2009  . Hyperlipidemia 02/25/2009  . DEPRESSION 02/25/2009  . Essential hypertension 02/25/2009  . BPH (benign prostatic hyperplasia) 02/25/2009    Past Medical History:  Diagnosis Date  . Diabetes mellitus without complication ([place])   . GERD (gastroesophageal reflux disease)   . Headache(784.0)   . Hyperlipidemia   . Hypertension     Past Surgical History:  Procedure Laterality Date  . HERNIA REPAIR     umbilical child  . LIPOMA EXCISION Right 11/29/2013   Procedure: EXCISION LIPOMA RIGHT AXILLA;  Surgeon: Autumn Messing III, MD;  Location: [facility];  Service: General;  Laterality: Right;    Social History  Substance Use Topics  . Smoking status: Never Smoker  . Smokeless tobacco: Never Used  . Alcohol use No    Family History  Problem Relation Age of Onset  . Cancer Father        lung  . Diabetes Father   . Hypertension Father   . Hyperlipidemia Mother    . Hypertension Mother     Allergies  Allergen Reactions  . 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 Ace Inhibitors     See 05/23/14 angioedema with Losartan    Medication list has been reviewed and updated.  Current Outpatient Prescriptions on File Prior to Visit  Medication Sig Dispense Refill  . alfuzosin (UROXATRAL) 10 MG 24 hr tablet Take 10 mg by mouth daily with breakfast.    . amLODipine (NORVASC) 5 MG tablet Take 1 tablet (5 mg total) by mouth daily. 90 tablet 1  . Blood Gluc Meter Disp-Strips (BLOOD GLUCOSE METER DISPOSABLE) DEVI Please given strips of type to work w/ pt's meter. Needs to check tid due to medication change, hyperglycemia 100 each 11  . Dulaglutide (TRULICITY) 9.56 OZ/3.0QM SOPN Inject 0.75 mg into the skin once a week. 4 pen 2  . fenofibrate (TRICOR) 145 MG tablet Take 1 tablet (145 mg total) by mouth daily. 30 tablet 11  . omeprazole (PRILOSEC) 20 MG capsule Take 20 mg by mouth daily.    . polyethylene glycol powder (GLYCOLAX/MIRALAX) powder Take 34 g by mouth daily. 500 g 1   No current facility-administered medications on file prior to visit.     ROS ROS otherwise unremarkable unless listed above.  Physical Examination: BP 120/77 (Cuff Size: Large)   Pulse  73   Resp 16   SpO2 96%  Ideal Body Weight:    Physical Exam  Constitutional: He is oriented to person, place, and time. He appears well-developed and well-nourished. No distress.  HENT:  Head: Normocephalic and atraumatic.  Eyes: Conjunctivae and EOM are normal. Pupils are equal, round, and reactive to light.  Cardiovascular: Normal rate.   Pulses:      Dorsalis pedis pulses are 2+ on the right side, and 2+ on the left side.  Pulmonary/Chest: Effort normal. No respiratory distress. He has no decreased breath sounds. He has no wheezes. He has no rhonchi.  Feet:  Right Foot:  Protective Sensation: 6 sites tested. 6 sites sensed.  Skin Integrity: Negative for skin breakdown.  Left  Foot:  Protective Sensation: 6 sites tested. 6 sites sensed.  Skin Integrity: Negative for skin breakdown.  Neurological: He is alert and oriented to person, place, and time.  Skin: Skin is warm and dry. He is not diaphoretic.  Psychiatric: He has a normal mood and affect. His behavior is normal.     Assessment and Plan: Austin Voong. is a 51 y.o. male who is here today for follow up of diabetes. stable Advised to return in 1-2 months for recheck of a1c.  Advised food diet.  Recommend diabetic dietician.  This was placed prior but he never showed.  He states that he would like to follow through at this time.  Uncontrolled type 2 diabetes mellitus without complication, without long-term current use of insulin ([place]) - Plan: Basic metabolic panel, HM Diabetes Foot Exam, CANCELED: POCT glycosylated hemoglobin (Hb A1C)  Ivar Drape, PA-C [facility] 5/26/20189:50 PM

## 2016-08-15 ENCOUNTER — Encounter: Payer: Self-pay | Admitting: Family Medicine

## 2016-08-15 ENCOUNTER — Ambulatory Visit (INDEPENDENT_AMBULATORY_CARE_PROVIDER_SITE_OTHER): Payer: BC Managed Care – PPO | Admitting: Family Medicine

## 2016-08-15 VITALS — BP 116/74 | HR 72 | Temp 98.4°F | Resp 17 | Ht 67.0 in | Wt 203.1 lb

## 2016-08-15 DIAGNOSIS — E088 Diabetes mellitus due to underlying condition with unspecified complications: Secondary | ICD-10-CM | POA: Diagnosis not present

## 2016-08-15 DIAGNOSIS — E0865 Diabetes mellitus due to underlying condition with hyperglycemia: Secondary | ICD-10-CM

## 2016-08-15 DIAGNOSIS — L03011 Cellulitis of right finger: Secondary | ICD-10-CM | POA: Diagnosis not present

## 2016-08-15 DIAGNOSIS — R739 Hyperglycemia, unspecified: Secondary | ICD-10-CM | POA: Diagnosis not present

## 2016-08-15 LAB — GLUCOSE, POCT (MANUAL RESULT ENTRY): POC Glucose: 397 mg/dl — AB (ref 70–99)

## 2016-08-15 MED ORDER — METFORMIN HCL 1000 MG PO TABS: ORAL_TABLET | ORAL | 1 refills | Status: DC

## 2016-08-15 MED ORDER — DOXYCYCLINE HYCLATE 100 MG PO TABS: 100.0000 mg | ORAL_TABLET | Freq: Two times a day (BID) | ORAL | 0 refills | Status: DC

## 2016-08-15 NOTE — Progress Notes (Signed)
PROCEDURE NOTE: Drainage of Acute Paronychia  Verbal consent obtained. 3rd digit prepped with alcohol pad. Digital block with 3cc of 2% lidocaine epineprine. Site cleansed with Betadine x 1.  Small incision of 0.6 cm was made using a 11 blade scalpel, discharge of  moderate amounts of pus. Cleansed and dressed. After care instructions provided.   Tenna Delaine, PA-C  [facility] 08/15/2016 3:11 PM

## 2016-08-15 NOTE — Progress Notes (Signed)
Austin Valencia. is a 52 y.o. male who presents to Primary Care at [facility] today for swelling of right hand.And lab results:  1.  Swelling of middle finger right hand: Started about 5 days ago. He was actually evaluated here 2 days ago for diabetes but states that he thought the swelling would go away and therefore he did not bring this up. However the swelling has persisted. It is along the medial aspect of his nail is noted basically the end of his finger is continued to increase in pain and swelling. No redness streaking up his finger. No joint involvement. It is only tender when he presses his finger. No fevers or chills. He is eating and drinking well.  #2 lab results: Patient has diagnosis of diabetes. He has had a gradual increase of his blood sugars on his labs. A1c also slowly rising. He is currently only on Trulicity. He was on a combination of Invokana/metformin in the past but this was stopped due to his rising creatinine.  Currently he denies any polyuria or polydipsia. He states he's had these symptoms in the past. No palpitations. Nocturia only 1 if at all. He is not measuring his blood sugars at home.  No abdominal pain or back pain  ROS as above.    PMH reviewed. Patient is a nonsmoker.   Past Medical History:  Diagnosis Date  . Diabetes mellitus without complication ([place])   . GERD (gastroesophageal reflux disease)   . Headache(784.0)   . Hyperlipidemia   . Hypertension    Past Surgical History:  Procedure Laterality Date  . HERNIA REPAIR     umbilical child  . LIPOMA EXCISION Right 11/29/2013   Procedure: EXCISION LIPOMA RIGHT AXILLA;  Surgeon: Autumn Messing III, MD;  Location: [place];  Service: General;  Laterality: Right;    Medications reviewed. Current Outpatient Prescriptions  Medication Sig Dispense Refill  . alfuzosin (UROXATRAL) 10 MG 24 hr tablet Take 10 mg by mouth daily with breakfast.    . amLODipine (NORVASC) 5 MG tablet Take 1 tablet (5  mg total) by mouth daily. 90 tablet 1  . Blood Gluc Meter Disp-Strips (BLOOD GLUCOSE METER DISPOSABLE) DEVI Please given strips of type to work w/ pt's meter. Needs to check tid due to medication change, hyperglycemia 100 each 11  . Dulaglutide (TRULICITY) 2.03 TD/9.7CB SOPN Inject 0.75 mg into the skin once a week. 4 pen 2  . fenofibrate (TRICOR) 145 MG tablet Take 1 tablet (145 mg total) by mouth daily. 30 tablet 11  . omeprazole (PRILOSEC) 20 MG capsule Take 20 mg by mouth daily.    . polyethylene glycol powder (GLYCOLAX/MIRALAX) powder Take 34 g by mouth daily. 500 g 1   No current facility-administered medications for this visit.      Physical Exam:  BP 116/74   Pulse 72   Temp 98.4 F (36.9 C) (Oral)   Resp 17   Ht 5\' 7"  (1.702 m)   Wt 203 lb 2 oz (92.1 kg)   SpO2 96%   BMI 31.81 kg/m  Gen:  Alert, cooperative patient who appears stated age in no acute distress.  Vital signs reviewed. HEENT: EOMI,  MMM Pulm:  Clear to auscultation bilaterally with good air movement.  No wheezes or rales noted.   Cardiac:  Regular rate and rhythm without murmur auscultated.  Good S1/S2. Abd:  Soft/nondistended/nontender.   Exts: Non edematous BL  LE, warm and well perfused.  MSK: He has  paronychia noted on the medial aspect of the third digit of his right hand. It is tender to palpation here has some fluctuance.  Results for orders placed or performed in visit on 08/15/16  POCT glucose (manual entry)  Result Value Ref Range   POC Glucose 397 (A) 70 - 99 mg/dl    Assessment and Plan:  1.  Paronychia:  -drained here in clinic. -He is immunocompromised as he is an uncontrolled diabetic and therefore treating him with doxycycline to cover for any MRSA on outpatient basis.  2. Diabetes mellitus, not currently controlled: -His metformin was previously stopped due to his risingcreatinine.  -his GFR is much better than it was previously and this seems to be a medicine he should probably be  on.  -restarting today. -He should follow-up next week or 2 with his primary care provider as he will likely needs another oral hypoglycemic. May also need insulin to better control blood sugars.  -sounds like his body is acclimated to current level of hyperglycemia as he is currently asymptomatic from a diabetes standpoint.

## 2016-08-15 NOTE — Patient Instructions (Addendum)
It was good to meet you today.  We have treated your finger infection with an incision and drainage.  Take the doxycycline twice daily for the next 7 days.  This is an antibiotic.  For your diabetes -- we have restarted your Metformin.  Take this with your trulicity.  Take 1 pill daily for 3 days, then start taking it twice daily after that.  Come back to see [place] in 2 weeks so we can see how you're doing.  If you're having worsening urination, fevers and chills, bad pain, or staying thirsty all the time, do not wait and come back sooner.      IF you received an x-ray today, you will receive an invoice from [facility]. Please contact [facility] at [phone] with questions or concerns regarding your invoice.   IF you received labwork today, you will receive an invoice from [place]. Please contact [facility] at [phone] with questions or concerns regarding your invoice.   Our billing staff will not be able to assist you with questions regarding bills from these companies.  You will be contacted with the lab results as soon as they are available. The fastest way to get your results is to activate your My Chart account. Instructions are located on the last page of this paperwork. If you have not heard from [place] regarding the results in 2 weeks, please contact this office.

## 2016-09-02 ENCOUNTER — Encounter: Payer: Self-pay | Admitting: Physician Assistant

## 2016-09-02 ENCOUNTER — Ambulatory Visit: Payer: BC Managed Care – PPO | Admitting: Family Medicine

## 2016-09-02 ENCOUNTER — Ambulatory Visit (INDEPENDENT_AMBULATORY_CARE_PROVIDER_SITE_OTHER): Payer: BC Managed Care – PPO | Admitting: Physician Assistant

## 2016-09-02 VITALS — BP 122/78 | HR 83 | Temp 97.9°F | Resp 18 | Ht 67.5 in | Wt 199.4 lb

## 2016-09-02 DIAGNOSIS — E1165 Type 2 diabetes mellitus with hyperglycemia: Secondary | ICD-10-CM

## 2016-09-02 LAB — POCT GLYCOSYLATED HEMOGLOBIN (HGB A1C): Hemoglobin A1C: 13.6

## 2016-09-02 LAB — GLUCOSE, POCT (MANUAL RESULT ENTRY): POC Glucose: 425 mg/dl — AB (ref 70–99)

## 2016-09-02 MED ORDER — DULAGLUTIDE 1.5 MG/0.5ML ~~LOC~~ SOAJ: 1.0000 "application " | SUBCUTANEOUS | 1 refills | Status: DC

## 2016-09-02 NOTE — Patient Instructions (Addendum)
I am referring you to endocrinology.  This is not being well controlled.  I need you to make sure you write this down.   Increase your trulicity to 1.5 weekly.   Place the trulicity at your thigh or abdomen   [facility] [address] [place], [place] [address]   Diabetes Mellitus and Food It is important for you to manage your blood sugar (glucose) level. Your blood glucose level can be greatly affected by what you eat. Eating healthier foods in the appropriate amounts throughout the day at about the same time each day will help you control your blood glucose level. It can also help slow or prevent worsening of your diabetes mellitus. Healthy eating may even help you improve the level of your blood pressure and reach or maintain a healthy weight. General recommendations for healthful eating and cooking habits include:  Eating meals and snacks regularly. Avoid going long periods of time without eating to lose weight.  Eating a diet that consists mainly of plant-based foods, such as fruits, vegetables, nuts, legumes, and whole grains.  Using low-heat cooking methods, such as baking, instead of high-heat cooking methods, such as deep frying.  Work with your dietitian to make sure you understand how to use the Nutrition Facts information on food labels. How can food affect me? Carbohydrates Carbohydrates affect your blood glucose level more than any other type of food. Your dietitian will help you determine how many carbohydrates to eat at each meal and teach you how to count carbohydrates. Counting carbohydrates is important to keep your blood glucose at a healthy level, especially if you are using insulin or taking certain medicines for diabetes mellitus. Alcohol Alcohol can cause sudden decreases in blood glucose (hypoglycemia), especially if you use insulin or take certain medicines for diabetes mellitus. Hypoglycemia can be a life-threatening condition. Symptoms of  hypoglycemia (sleepiness, dizziness, and disorientation) are similar to symptoms of having too much alcohol. If your health care provider has given you approval to drink alcohol, do so in moderation and use the following guidelines:  Women should not have more than one drink per day, and men should not have more than two drinks per day. One drink is equal to: ? 12 oz of beer. ? 5 oz of wine. ? 1 oz of hard liquor.  Do not drink on an empty stomach.  Keep yourself hydrated. Have water, diet soda, or unsweetened iced tea.  Regular soda, juice, and other mixers might contain a lot of carbohydrates and should be counted.  What foods are not recommended? As you make food choices, it is important to remember that all foods are not the same. Some foods have fewer nutrients per serving than other foods, even though they might have the same number of calories or carbohydrates. It is difficult to get your body what it needs when you eat foods with fewer nutrients. Examples of foods that you should avoid that are high in calories and carbohydrates but low in nutrients include:  Trans fats (most processed foods list trans fats on the Nutrition Facts label).  Regular soda.  Juice.  Candy.  Sweets, such as cake, pie, doughnuts, and cookies.  Fried foods.  What foods can I eat? Eat nutrient-rich foods, which will nourish your body and keep you healthy. The food you should eat also will depend on several factors, including:  The calories you need.  The medicines you take.  Your weight.  Your blood glucose level.  Your  blood pressure level.  Your cholesterol level.  You should eat a variety of foods, including:  Protein. ? Lean cuts of meat. ? Proteins low in saturated fats, such as fish, egg whites, and beans. Avoid processed meats.  Fruits and vegetables. ? Fruits and vegetables that may help control blood glucose levels, such as apples, mangoes, and yams.  Dairy  products. ? Choose fat-free or low-fat dairy products, such as milk, yogurt, and cheese.  Grains, bread, pasta, and rice. ? Choose whole grain products, such as multigrain bread, whole oats, and brown rice. These foods may help control blood pressure.  Fats. ? Foods containing healthful fats, such as nuts, avocado, olive oil, canola oil, and fish.  Does everyone with diabetes mellitus have the same meal plan? Because every person with diabetes mellitus is different, there is not one meal plan that works for everyone. It is very important that you meet with a dietitian who will help you create a meal plan that is just right for you. This information is not intended to replace advice given to you by your health care provider. Make sure you discuss any questions you have with your health care provider. Document Released: 12/04/2004 Document Revised: 08/15/2015 Document Reviewed: 02/03/2013 Elsevier Interactive Patient Education  2017 [employer].    IF you received an x-ray today, you will receive an invoice from [facility]. Please contact [facility] at [phone] with questions or concerns regarding your invoice.   IF you received labwork today, you will receive an invoice from [place]. Please contact [facility] at [phone] with questions or concerns regarding your invoice.   Our billing staff will not be able to assist you with questions regarding bills from these companies.  You will be contacted with the lab results as soon as they are available. The fastest way to get your results is to activate your My Chart account. Instructions are located on the last page of this paperwork. If you have not heard from [place] regarding the results in 2 weeks, please contact this office.

## 2016-09-02 NOTE — Progress Notes (Signed)
PRIMARY CARE AT [place], [place] [address] [phone]  Date:  09/02/2016   Name:  Austin Valencia.   DOB:  [birth year]   MRN:  [id number]  PCP:  [place], FNP    History of Present Illness:  Austin Valencia. is a 51 y.o. male patient who presents to PCP with  Chief Complaint  Patient presents with  . Diabetes     He has not checked his glucose for the last few mornings.  His glucose was 236 when he last checked.  He takes metformin twice per day.  He has not taken it this morning, as he has not eaten yet. He is placing the trulicity at his deltoid areas as well as at times at the thigh and abdomen.  He has noted that some of the fluid may be coming out onto the skin, specifically when placed at arm. He states that he is not having any nausea, diarrhea, tremulousness, or polyuria.  He has noticed some blurring.  He had a visit with opthalmology however this was missed due to him forgetting the schedule.  He is attempting to watch his sugar intake.  Patient Active Problem List   Diagnosis Date Noted  . Left inguinal hernia 03/12/2016  . Controlled type 2 diabetes mellitus ([place]) 06/22/2015  . 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 GERD 03/28/2009  . Hyperlipidemia 02/25/2009  . DEPRESSION 02/25/2009  . Essential hypertension 02/25/2009  . BPH (benign prostatic hyperplasia) 02/25/2009    Past Medical History:  Diagnosis Date  . Diabetes mellitus without complication ([place])   . GERD (gastroesophageal reflux disease)   . Headache(784.0)   . Hyperlipidemia   . Hypertension     Past Surgical History:  Procedure Laterality Date  . HERNIA REPAIR     umbilical child  . LIPOMA EXCISION Right 11/29/2013   Procedure: EXCISION LIPOMA RIGHT AXILLA;  Surgeon: Autumn Messing III, MD;  Location: [facility];  Service: General;  Laterality: Right;    Social History  Substance Use Topics  . Smoking status: Never Smoker  . Smokeless tobacco: Never Used  .  Alcohol use No    Family History  Problem Relation Age of Onset  . Cancer Father        lung  . Diabetes Father   . Hypertension Father   . Hyperlipidemia Mother   . Hypertension Mother     Allergies  Allergen Reactions  . 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 Ace Inhibitors     See 05/23/14 angioedema with Losartan    Medication list has been reviewed and updated.  Current Outpatient Prescriptions on File Prior to Visit  Medication Sig Dispense Refill  . alfuzosin (UROXATRAL) 10 MG 24 hr tablet Take 10 mg by mouth daily with breakfast.    . amLODipine (NORVASC) 5 MG tablet Take 1 tablet (5 mg total) by mouth daily. 90 tablet 1  . Blood Gluc Meter Disp-Strips (BLOOD GLUCOSE METER DISPOSABLE) DEVI Please given strips of type to work w/ pt's meter. Needs to check tid due to medication change, hyperglycemia 100 each 11  . Dulaglutide (TRULICITY) 2.83 TD/1.7OH SOPN Inject 0.75 mg into the skin once a week. 4 pen 2  . fenofibrate (TRICOR) 145 MG tablet Take 1 tablet (145 mg total) by mouth daily. 30 tablet 11  . metFORMIN (GLUCOPHAGE) 1000 MG tablet Take 1 tab po daily x 3 days, then 1 tab po bid after  that 60 tablet 1  . omeprazole (PRILOSEC) 20 MG capsule Take 20 mg by mouth daily.    . polyethylene glycol powder (GLYCOLAX/MIRALAX) powder Take 34 g by mouth daily. 500 g 1   No current facility-administered medications on file prior to visit.     ROS ROS otherwise unremarkable unless listed above.  Physical Examination: BP 122/78   Pulse 83   Temp 97.9 F (36.6 C) (Oral)   Resp 18   Ht 5' 7.5" (1.715 m)   Wt 199 lb 6.4 oz (90.4 kg)   SpO2 96%   BMI 30.77 kg/m  Ideal Body Weight: Weight in (lb) to have BMI = 25: 161.7  Physical Exam  Constitutional: He is oriented to person, place, and time. He appears well-developed and well-nourished. No distress.  HENT:  Head: Normocephalic and atraumatic.  Eyes: Conjunctivae and EOM are normal. Pupils are equal, round,  and reactive to light.  Cardiovascular: Normal rate and regular rhythm.  Exam reveals no friction rub.   No murmur heard. Pulmonary/Chest: Effort normal. No respiratory distress.  Neurological: He is alert and oriented to person, place, and time.  Skin: Skin is warm and dry. He is not diaphoretic.  Psychiatric: He has a normal mood and affect. His behavior is normal.    Results for orders placed or performed in visit on 96/29/52  Basic metabolic panel  Result Value Ref Range   Glucose 352 (H) 65 - 99 mg/dL   BUN 16 6 - 24 mg/dL   Creatinine, Ser 1.26 0.76 - 1.27 mg/dL   GFR calc non Af Amer 67 >59 mL/min/1.73   GFR calc Af Amer 77 >59 mL/min/1.73   BUN/Creatinine Ratio 13 9 - 20   Sodium 137 134 - 144 mmol/L   Potassium 4.5 3.5 - 5.2 mmol/L   Chloride 98 96 - 106 mmol/L   CO2 20 20 - 29 mmol/L   Calcium 9.6 8.7 - 10.2 mg/dL  POCT glucose (manual entry)  Result Value Ref Range   POC Glucose 425 (A) 70 - 99 mg/dl  POCT glycosylated hemoglobin (Hb A1C)  Result Value Ref Range   Hemoglobin A1C 13.6     Assessment and Plan: Austin Valencia. is a 51 y.o. male who is here today  Increasing trulicity Advised to place at the abdomen and thigh, but no arms at this time by himself.   He will return in 4 weeks.  Uncontrolled type 2 diabetes mellitus with hyperglycemia, without long-term current use of insulin ([place]) - Plan: POCT glucose (manual entry), POCT glycosylated hemoglobin (Hb W4X), Basic metabolic panel, Ambulatory referral to Endocrinology  Ivar Drape, PA-C [facility] 6/18/20186:17 PM

## 2016-09-03 LAB — BASIC METABOLIC PANEL
BUN/Creatinine Ratio: 13 (ref 9–20)
BUN: 16 mg/dL (ref 6–24)
CALCIUM: 9.6 mg/dL (ref 8.7–10.2)
CO2: 20 mmol/L (ref 20–29)
CREATININE: 1.26 mg/dL (ref 0.76–1.27)
Chloride: 98 mmol/L (ref 96–106)
GFR calc Af Amer: 77 mL/min/{1.73_m2} (ref >59)
GFR, EST NON AFRICAN AMERICAN: 67 mL/min/{1.73_m2} (ref >59)
Glucose: 352 mg/dL — ABNORMAL HIGH (ref 65–99)
Potassium: 4.5 mmol/L (ref 3.5–5.2)
SODIUM: 137 mmol/L (ref 134–144)

## 2016-09-07 ENCOUNTER — Ambulatory Visit (INDEPENDENT_AMBULATORY_CARE_PROVIDER_SITE_OTHER): Payer: BC Managed Care – PPO | Admitting: Family

## 2016-09-07 ENCOUNTER — Encounter: Payer: Self-pay | Admitting: Family

## 2016-09-07 DIAGNOSIS — E1165 Type 2 diabetes mellitus with hyperglycemia: Secondary | ICD-10-CM

## 2016-09-07 DIAGNOSIS — IMO0001 Reserved for inherently not codable concepts without codable children: Secondary | ICD-10-CM

## 2016-09-07 NOTE — Assessment & Plan Note (Signed)
Most recent A1c of 13.6 indicating poor control of diabetes with current medication regimen. Recently evaluated at urgent care with increase in Trulicity which he has yet to start. Educated and demonstrated proper usage of Trulicity pen with patient repeat demonstration. Continue current dosage of metformin. Continue to monitor blood sugars at home and follow up in 1 month or sooner if blood sugars remain elevated.

## 2016-09-07 NOTE — Progress Notes (Signed)
Subjective:    Patient ID: Austin Valencia., male    DOB: 01-26-1966, 51 y.o.   MRN: [id number]  Chief Complaint  Patient presents with  . Follow-up    diabetes follow up    HPI:  Austin Valencia. is a 51 y.o. male who  has a past medical history of Diabetes mellitus without complication ([place]); GERD (gastroesophageal reflux disease); Headache(784.0); Hyperlipidemia; and Hypertension. and presents today for a follow up office visit.   Diabetes - Currently maintained on Trulicity and metromin. Reports taking the medicaiton as prescribed and denies adverse side effects. Was recently seen in Urgent Care with the recommendation of increasing Trulicity and a referral was placed to endocrinology. Has been exercising and eating better. Most recent A1c of 13.6.  Lab Results  Component Value Date   HGBA1C 13.6 09/02/2016     Allergies  Allergen Reactions  . 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 Ace Inhibitors     See 05/23/14 angioedema with Losartan      Outpatient Medications Prior to Visit  Medication Sig Dispense Refill  . alfuzosin (UROXATRAL) 10 MG 24 hr tablet Take 10 mg by mouth daily with breakfast.    . amLODipine (NORVASC) 5 MG tablet Take 1 tablet (5 mg total) by mouth daily. 90 tablet 1  . Blood Gluc Meter Disp-Strips (BLOOD GLUCOSE METER DISPOSABLE) DEVI Please given strips of type to work w/ pt's meter. Needs to check tid due to medication change, hyperglycemia 100 each 11  . Dulaglutide (TRULICITY) 1.5 OV/5.6EP SOPN Inject 1 application into the skin once a week. 4 pen 1  . fenofibrate (TRICOR) 145 MG tablet Take 1 tablet (145 mg total) by mouth daily. 30 tablet 11  . metFORMIN (GLUCOPHAGE) 1000 MG tablet Take 1 tab po daily x 3 days, then 1 tab po bid after that 60 tablet 1  . omeprazole (PRILOSEC) 20 MG capsule Take 20 mg by mouth daily.    . polyethylene glycol powder (GLYCOLAX/MIRALAX) powder Take 34 g by mouth daily. 500 g 1   No facility-administered  medications prior to visit.      Review of Systems  Eyes:       Negative for changes in vision.  Respiratory: Negative for chest tightness and shortness of breath.   Cardiovascular: Negative for chest pain, palpitations and leg swelling.  Endocrine: Negative for polydipsia, polyphagia and polyuria.  Neurological: Negative for dizziness, weakness, light-headedness and headaches.      Objective:    BP 118/62 (BP Location: Left Arm, Patient Position: Sitting, Cuff Size: Large)   Pulse 78   Temp 98.8 F (37.1 C) (Oral)   Resp 16   Ht 5' 7.5" (1.715 m)   Wt 219 lb (99.3 kg)   SpO2 96%   BMI 33.79 kg/m  Nursing note and vital signs reviewed.  Physical Exam  Constitutional: He is oriented to person, place, and time. He appears well-developed and well-nourished. No distress.  Cardiovascular: Normal rate, regular rhythm, normal heart sounds and intact distal pulses.   Pulmonary/Chest: Effort normal and breath sounds normal.  Neurological: He is alert and oriented to person, place, and time.  Skin: Skin is warm and dry.  Psychiatric: He has a normal mood and affect. His behavior is normal. Judgment and thought content normal.       Assessment & Plan:   Problem List Items Addressed This Visit      Endocrine   Uncontrolled diabetes mellitus ([place])  Most recent A1c of 13.6 indicating poor control of diabetes with current medication regimen. Recently evaluated at urgent care with increase in Trulicity which he has yet to start. Educated and demonstrated proper usage of Trulicity pen with patient repeat demonstration. Continue current dosage of metformin. Continue to monitor blood sugars at home and follow up in 1 month or sooner if blood sugars remain elevated.           I am having Mr. Garduno maintain his omeprazole, amLODipine, fenofibrate, alfuzosin, BLOOD GLUCOSE METER DISPOSABLE, polyethylene glycol powder, metFORMIN, and Dulaglutide.   Follow-up: Return in about 1 month  (around 10/07/2016), or if symptoms worsen or fail to improve.  Mauricio Po, FNP

## 2016-09-07 NOTE — Patient Instructions (Signed)
Thank you for choosing [facility].  SUMMARY AND INSTRUCTIONS:  Please continue to monitor your blood sugars at home.   Follow a low carbohydrate intake.   Increase your Trulicity as prescribed.  Continue to exercise and eat a balanced   Medication:  Your prescription(s) have been submitted to your pharmacy or been printed and provided for you. Please take as directed and contact our office if you believe you are having problem(s) with the medication(s) or have any questions.  Follow up:  If your symptoms worsen or fail to improve, please contact our office for further instruction, or in case of emergency go directly to the emergency room at the closest medical facility.

## 2016-09-10 ENCOUNTER — Ambulatory Visit: Payer: BC Managed Care – PPO | Admitting: Dietician

## 2016-09-12 ENCOUNTER — Other Ambulatory Visit: Payer: Self-pay | Admitting: Family

## 2016-09-22 LAB — HM DIABETES EYE EXAM

## 2016-10-02 ENCOUNTER — Ambulatory Visit: Payer: BC Managed Care – PPO | Admitting: Registered"

## 2016-10-05 ENCOUNTER — Other Ambulatory Visit: Payer: Self-pay | Admitting: Family

## 2016-10-14 ENCOUNTER — Ambulatory Visit (INDEPENDENT_AMBULATORY_CARE_PROVIDER_SITE_OTHER): Payer: BC Managed Care – PPO | Admitting: Physician Assistant

## 2016-10-14 ENCOUNTER — Encounter: Payer: Self-pay | Admitting: Physician Assistant

## 2016-10-14 VITALS — BP 98/61 | HR 79 | Temp 97.5°F | Resp 16 | Ht 67.0 in | Wt 211.0 lb

## 2016-10-14 DIAGNOSIS — E1165 Type 2 diabetes mellitus with hyperglycemia: Secondary | ICD-10-CM | POA: Diagnosis not present

## 2016-10-14 DIAGNOSIS — IMO0001 Reserved for inherently not codable concepts without codable children: Secondary | ICD-10-CM

## 2016-10-14 LAB — GLUCOSE, POCT (MANUAL RESULT ENTRY): POC Glucose: 104 mg/dl — AB (ref 70–99)

## 2016-10-14 NOTE — Patient Instructions (Addendum)
Please continue your dosing.  Your blood sugar looks great.  Keep doing what you are doing.  This is likely the 1.5 that I switched it to at your last visit.   Your appointment is at 9:30, 12/09/2016 with Dr. Elna Breslow.    IF you received an x-ray today, you will receive an invoice from [facility]. Please contact [facility] at [phone] with questions or concerns regarding your invoice.   IF you received labwork today, you will receive an invoice from [place]. Please contact [facility] at [phone] with questions or concerns regarding your invoice.   Our billing staff will not be able to assist you with questions regarding bills from these companies.  You will be contacted with the lab results as soon as they are available. The fastest way to get your results is to activate your My Chart account. Instructions are located on the last page of this paperwork. If you have not heard from [place] regarding the results in 2 weeks, please contact this office.

## 2016-10-14 NOTE — Progress Notes (Signed)
PRIMARY CARE AT [name], [name] [address] [phone]  Date:  10/14/2016   Name:  Austin Valencia.   DOB:  Nov 14, 1965   MRN:  [id number]  PCP:  Golden Circle, FNP    History of Present Illness:  Austin Valencia. is a 51 y.o. male patient who presents to PCP with  Chief Complaint  Patient presents with  . Follow-up    DIABETES and A1c    Patient is here for follow up of his diabetes.  This is followed by endocrinology.  He was last seen, 5 weeks ago by endocrinology.  hwe was advised to return in 1 month, but he cancelled. Patient reports that he is only taking .75 of the trulicity weekly still.  He is not injecting in his arm any longer, and only places at trunk or the thighs.  He had complained that the fluid would come out.  He reports that his glucose has been around 100.   He denies any side effects to the medications.     Patient Active Problem List   Diagnosis Date Noted  . Left inguinal hernia 03/12/2016  . Uncontrolled diabetes mellitus ([place]) 06/22/2015  . 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 GERD 03/28/2009  . Hyperlipidemia 02/25/2009  . DEPRESSION 02/25/2009  . Essential hypertension 02/25/2009  . BPH (benign prostatic hyperplasia) 02/25/2009    Past Medical History:  Diagnosis Date  . Diabetes mellitus without complication ([place])   . GERD (gastroesophageal reflux disease)   . Headache(784.0)   . Hyperlipidemia   . Hypertension     Past Surgical History:  Procedure Laterality Date  . HERNIA REPAIR     umbilical child  . LIPOMA EXCISION Right 11/29/2013   Procedure: EXCISION LIPOMA RIGHT AXILLA;  Surgeon: Autumn Messing III, MD;  Location: [facility];  Service: General;  Laterality: Right;    Social History  Substance Use Topics  . Smoking status: Never Smoker  . Smokeless tobacco: Never Used  . Alcohol use No    Family History  Problem Relation Age of Onset  . Cancer Father        lung  . Diabetes Father   .  Hypertension Father   . Hyperlipidemia Mother   . Hypertension Mother     Allergies  Allergen Reactions  . 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 Ace Inhibitors     See 05/23/14 angioedema with Losartan    Medication list has been reviewed and updated.  Current Outpatient Prescriptions on File Prior to Visit  Medication Sig Dispense Refill  . alfuzosin (UROXATRAL) 10 MG 24 hr tablet Take 10 mg by mouth daily with breakfast.    . amLODipine (NORVASC) 5 MG tablet Take 1 tablet (5 mg total) by mouth daily. 90 tablet 1  . fenofibrate (TRICOR) 145 MG tablet TAKE ONE TABLET BY MOUTH ONCE DAILY 30 tablet 11  . metFORMIN (GLUCOPHAGE) 1000 MG tablet Take 1 tab po daily x 3 days, then 1 tab po bid after that 60 tablet 1  . omeprazole (PRILOSEC) 20 MG capsule Take 20 mg by mouth daily.    . polyethylene glycol powder (GLYCOLAX/MIRALAX) powder Take 34 g by mouth daily. 500 g 1  . TRULICITY 2.44 WN/0.2VO SOPN INJECT 0.75 MG INTO THE SKIN EVERY WEEK 12 pen 0  . Blood Gluc Meter Disp-Strips (BLOOD GLUCOSE METER DISPOSABLE) DEVI Please given strips of type to work w/ pt's meter. Needs to check  tid due to medication change, hyperglycemia 100 each 11  . Dulaglutide (TRULICITY) 1.5 HY/8.5OY SOPN Inject 1 application into the skin once a week. 4 pen 1   No current facility-administered medications on file prior to visit.     ROS ROS otherwise unremarkable unless listed above.  Physical Examination: BP 98/61 (BP Location: Right Arm, Patient Position: Sitting, Cuff Size: Large)   Pulse 79   Temp (!) 97.5 F (36.4 C) (Oral)   Resp 16   Ht 5\' 7"  (1.702 m)   Wt 211 lb (95.7 kg)   SpO2 96%   BMI 33.05 kg/m  Ideal Body Weight: Weight in (lb) to have BMI = 25: 159.3  Physical Exam  Constitutional: He is oriented to person, place, and time. He appears well-developed and well-nourished. No distress.  HENT:  Head: Normocephalic and atraumatic.  Eyes: Pupils are equal, round, and reactive to  light. Conjunctivae and EOM are normal.  Cardiovascular: Normal rate, regular rhythm and normal heart sounds.  Exam reveals no friction rub.   No murmur heard. Pulmonary/Chest: Effort normal. No respiratory distress. He has no wheezes.  Neurological: He is alert and oriented to person, place, and time.  Skin: Skin is warm and dry. He is not diaphoretic.  Psychiatric: He has a normal mood and affect. His behavior is normal.     Assessment and Plan: Austin Valencia. is a 51 y.o. male who is here today for cc of diabetes recheck of diabetes Glucose looks remarkable.  Likely not placing weekly dosage in an appropriate area for therapeutic efficacy.  He will continue this and return to endocrinology, or to me within 2 months.   Uncontrolled type 2 diabetes mellitus without complication, without long-term current use of insulin ([place]) - Plan: POCT glucose (manual entry)  Ivar Drape, PA-C [facility] 8/1/20184:14 PM

## 2016-10-18 ENCOUNTER — Other Ambulatory Visit: Payer: Self-pay | Admitting: Family Medicine

## 2016-12-09 ENCOUNTER — Ambulatory Visit (INDEPENDENT_AMBULATORY_CARE_PROVIDER_SITE_OTHER): Payer: BC Managed Care – PPO | Admitting: Family

## 2016-12-09 ENCOUNTER — Encounter: Payer: Self-pay | Admitting: Family

## 2016-12-09 VITALS — BP 112/70 | HR 74 | Temp 98.3°F | Resp 16 | Ht 67.0 in | Wt 229.0 lb

## 2016-12-09 DIAGNOSIS — Z23 Encounter for immunization: Secondary | ICD-10-CM

## 2016-12-09 DIAGNOSIS — E1165 Type 2 diabetes mellitus with hyperglycemia: Secondary | ICD-10-CM

## 2016-12-09 DIAGNOSIS — IMO0001 Reserved for inherently not codable concepts without codable children: Secondary | ICD-10-CM

## 2016-12-09 LAB — POCT GLYCOSYLATED HEMOGLOBIN (HGB A1C): HEMOGLOBIN A1C: 6.9

## 2016-12-09 MED ORDER — DULAGLUTIDE 1.5 MG/0.5ML ~~LOC~~ SOAJ: 1.0000 "application " | SUBCUTANEOUS | 2 refills | Status: DC

## 2016-12-09 NOTE — Assessment & Plan Note (Signed)
In office A1c of 6.9 indicating improved control of type 2 diabetes. Continue current dosage of metformin and Trulicity. Encouraged to monitor blood sugars at home and follow/modified carbohydrate intake. Continue physical activity. All diabetic screenings are up-to-date per recommendations. Follow-up in 3 months or sooner if needed.

## 2016-12-09 NOTE — Progress Notes (Signed)
Subjective:    Patient ID: Austin Roche., male    DOB: March 19, 1966, 51 y.o.   MRN: [id number]  Chief Complaint  Patient presents with  . Follow-up    diabetes follow up, states he has not taken trulicity in about a month bc there were no refills, does want to get back on it if needed    HPI:  Austin Valencia. is a 51 y.o. male who  has a past medical history of Diabetes mellitus without complication ([place]); GERD (gastroesophageal reflux disease); Headache(784.0); Hyperlipidemia; and Hypertension. and presents today for a follow up office visit.  Diabetes - Currently prescribed metformin and Trulicity. Reports taking the metformin as prescribed but has been out of the Trulicity for about 1 month secondary to not having refills. Reports taking the medication as prescribed and denies adverse side effects or hypoglycemic readings. Blood sugars at home . Denies new symptoms of end organ damage. No excessive hunger, thirst, or urination. Working on a low/carbohydrate modified oral intake. Has been increasing his physical activity.   Lab Results  Component Value Date   HGBA1C 6.9 12/09/2016     Lab Results  Component Value Date   CREATININE 1.26 09/02/2016   BUN 16 09/02/2016   NA 137 09/02/2016   K 4.5 09/02/2016   CL 98 09/02/2016   CO2 20 09/02/2016      Allergies  Allergen Reactions  . 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 Ace Inhibitors     See 05/23/14 angioedema with Losartan      Outpatient Medications Prior to Visit  Medication Sig Dispense Refill  . alfuzosin (UROXATRAL) 10 MG 24 hr tablet Take 10 mg by mouth daily with breakfast.    . amLODipine (NORVASC) 5 MG tablet Take 1 tablet (5 mg total) by mouth daily. 90 tablet 1  . Blood Gluc Meter Disp-Strips (BLOOD GLUCOSE METER DISPOSABLE) DEVI Please given strips of type to work w/ pt's meter. Needs to check tid due to medication change, hyperglycemia 100 each 11  . fenofibrate (TRICOR) 145 MG tablet TAKE  ONE TABLET BY MOUTH ONCE DAILY 30 tablet 11  . metFORMIN (GLUCOPHAGE) 1000 MG tablet TAKE 1 TABLET BY MOUTH ONCE DAILY FOR 3 DAYS THEN  1  TABLET BY MOUTH  TWICE  DAILY  AFTER  THAT 60 tablet 1  . omeprazole (PRILOSEC) 20 MG capsule Take 20 mg by mouth daily.    . polyethylene glycol powder (GLYCOLAX/MIRALAX) powder Take 34 g by mouth daily. 500 g 1  . Dulaglutide (TRULICITY) 1.5 ER/7.4YC SOPN Inject 1 application into the skin once a week. 4 pen 1   No facility-administered medications prior to visit.     Past Medical History:  Diagnosis Date  . Diabetes mellitus without complication ([place])   . GERD (gastroesophageal reflux disease)   . Headache(784.0)   . Hyperlipidemia   . Hypertension      Review of Systems  Eyes:       Negative for changes in vision.  Respiratory: Negative for chest tightness and shortness of breath.   Cardiovascular: Negative for chest pain, palpitations and leg swelling.  Endocrine: Negative for polydipsia, polyphagia and polyuria.  Neurological: Negative for dizziness, weakness, light-headedness and headaches.      Objective:    BP 112/70 (BP Location: Left Arm, Patient Position: Sitting, Cuff Size: Large)   Pulse 74   Temp 98.3 F (36.8 C) (Oral)   Resp 16   Ht 5'  7" (1.702 m)   Wt 229 lb (103.9 kg)   SpO2 96%   BMI 35.87 kg/m  Nursing note and vital signs reviewed.  Physical Exam  Constitutional: He is oriented to person, place, and time. He appears well-developed and well-nourished. No distress.  Cardiovascular: Normal rate, regular rhythm, normal heart sounds and intact distal pulses.   Pulmonary/Chest: Effort normal and breath sounds normal.  Neurological: He is alert and oriented to person, place, and time.  Skin: Skin is warm and dry.  Psychiatric: He has a normal mood and affect. His behavior is normal. Judgment and thought content normal.       Assessment & Plan:   Problem List Items Addressed This Visit      Endocrine    Uncontrolled diabetes mellitus ([place]) - Primary    In office A1c of 6.9 indicating improved control of type 2 diabetes. Continue current dosage of metformin and Trulicity. Encouraged to monitor blood sugars at home and follow/modified carbohydrate intake. Continue physical activity. All diabetic screenings are up-to-date per recommendations. Follow-up in 3 months or sooner if needed.      Relevant Medications   Dulaglutide (TRULICITY) 1.5 KG/2.5KY SOPN   Other Relevant Orders   POCT HgB A1C (Completed)    Other Visit Diagnoses    Need for influenza vaccination       Relevant Orders   Flu Vaccine QUAD 36+ mos IM (Completed)       I am having Mr. Eifler maintain his omeprazole, amLODipine, alfuzosin, BLOOD GLUCOSE METER DISPOSABLE, polyethylene glycol powder, fenofibrate, metFORMIN, and Dulaglutide.   Meds ordered this encounter  Medications  . Dulaglutide (TRULICITY) 1.5 HC/6.2BJ SOPN    Sig: Inject 1 application into the skin once a week.    Dispense:  4 pen    Refill:  2    Order Specific Question:   Supervising Provider    Answer:   Pricilla Holm A [[id number]]     Follow-up: Return in about 3 months (around 03/10/2017), or if symptoms worsen or fail to improve.  Austin Po, FNP

## 2016-12-09 NOTE — Patient Instructions (Signed)
Thank you for choosing [facility].  SUMMARY AND INSTRUCTIONS:  Please continue to take your Trulicity and Metformin.  Your A1c is significantly improved at 6.9!  Medication:  Your prescription(s) have been submitted to your pharmacy or been printed and provided for you. Please take as directed and contact our office if you believe you are having problem(s) with the medication(s) or have any questions.  Follow up:  If your symptoms worsen or fail to improve, please contact our office for further instruction, or in case of emergency go directly to the emergency room at the closest medical facility.

## 2016-12-15 ENCOUNTER — Other Ambulatory Visit: Payer: Self-pay | Admitting: Family Medicine

## 2016-12-23 ENCOUNTER — Ambulatory Visit (INDEPENDENT_AMBULATORY_CARE_PROVIDER_SITE_OTHER): Payer: BC Managed Care – PPO | Admitting: Family Medicine

## 2016-12-23 ENCOUNTER — Ambulatory Visit (INDEPENDENT_AMBULATORY_CARE_PROVIDER_SITE_OTHER): Payer: BC Managed Care – PPO

## 2016-12-23 ENCOUNTER — Encounter: Payer: Self-pay | Admitting: Family Medicine

## 2016-12-23 VITALS — BP 134/85 | HR 82 | Temp 98.6°F | Resp 18 | Ht 67.0 in | Wt 226.8 lb

## 2016-12-23 DIAGNOSIS — R1032 Left lower quadrant pain: Secondary | ICD-10-CM

## 2016-12-23 DIAGNOSIS — R197 Diarrhea, unspecified: Secondary | ICD-10-CM

## 2016-12-23 LAB — POCT CBC
Granulocyte percent: 62.7 %G (ref 37–80)
HCT, POC: 42.7 % — AB (ref 43.5–53.7)
HEMOGLOBIN: 14.5 g/dL (ref 14.1–18.1)
Lymph, poc: 1.6 (ref 0.6–3.4)
MCH: 32.3 pg — AB (ref 27–31.2)
MCHC: 33.9 g/dL (ref 31.8–35.4)
MCV: 95.3 fL (ref 80–97)
MID (cbc): 0.4 (ref 0–0.9)
MPV: 6.9 fL (ref 0–99.8)
PLATELET COUNT, POC: 215 10*3/uL (ref 142–424)
POC Granulocyte: 3.4 (ref 2–6.9)
POC LYMPH PERCENT: 30 %L (ref 10–50)
POC MID %: 7.3 % (ref 0–12)
RBC: 4.49 M/uL — AB (ref 4.69–6.13)
RDW, POC: 12.9 %
WBC: 5.4 10*3/uL (ref 4.6–10.2)

## 2016-12-23 LAB — POCT URINALYSIS DIP (MANUAL ENTRY)
Bilirubin, UA: NEGATIVE
Blood, UA: NEGATIVE
Glucose, UA: NEGATIVE mg/dL
Ketones, POC UA: NEGATIVE mg/dL
LEUKOCYTES UA: NEGATIVE
NITRITE UA: NEGATIVE
PH UA: 6 (ref 5.0–8.0)
PROTEIN UA: NEGATIVE mg/dL
Spec Grav, UA: >=1.03 — AB (ref 1.010–1.025)
Urobilinogen, UA: 0.2 E.U./dL

## 2016-12-23 LAB — POC HEMOCCULT BLD/STL (OFFICE/1-CARD/DIAGNOSTIC): Fecal Occult Blood, POC: NEGATIVE

## 2016-12-23 LAB — POC MICROSCOPIC URINALYSIS (UMFC): Mucus: ABSENT

## 2016-12-23 MED ORDER — OMEPRAZOLE 40 MG PO CPDR: 40.0000 mg | DELAYED_RELEASE_CAPSULE | Freq: Two times a day (BID) | ORAL | 0 refills | Status: DC

## 2016-12-23 MED ORDER — CIPROFLOXACIN HCL 500 MG PO TABS: 500.0000 mg | ORAL_TABLET | Freq: Two times a day (BID) | ORAL | 0 refills | Status: DC

## 2016-12-23 MED ORDER — ONDANSETRON 4 MG PO TBDP: 4.0000 mg | ORAL_TABLET | Freq: Three times a day (TID) | ORAL | 0 refills | Status: DC | PRN

## 2016-12-23 MED ORDER — GI COCKTAIL ~~LOC~~
30.0000 mL | Freq: Once | ORAL | Status: AC
  Administered 2016-12-23: 30 mL via ORAL

## 2016-12-23 NOTE — Progress Notes (Signed)
Subjective:    Patient ID: Austin Roche., male    DOB: 27-Feb-1966, 51 y.o.   MRN: [id number]     HPI Austin Valencia is a 51 yr old male present at PCP with   Chief Complaint  Patient presents with  . Abdominal Pain    since Monday patient thinks it might have come from eggs that were in the refrigerator for a while.   . Gastroesophageal Reflux    Since the eggs he has had upper left abd pain - intermittent stomach ache, will complete resolve but then last for about an hour - will most often happen in morning and evenings. Resolved mid day.  More diarrhea, indigestion, nausea. Decreased intake, not drinking as many fluids but able to keep urine clear/normal.  No urinary sxs.  Having 3 loose large BM a day while normally has 1-2/d. No blood, hematochezia, or melana. No emesis.  + GERD not treated by the ompreazole which he takes prn and has taken for the past few days. Not tried any other meds.    He had gone off his Trulicity x 2 mos and then PCP restarted it - appetite nml.  Occ has abd pain when on it prior and it has curbed his appetite, made him feel more full. + bloating  Past Medical History:  Diagnosis Date  . Diabetes mellitus without complication ([place])   . GERD (gastroesophageal reflux disease)   . Headache(784.0)   . Hyperlipidemia   . Hypertension    Past Surgical History:  Procedure Laterality Date  . HERNIA REPAIR     umbilical child  . LIPOMA EXCISION Right 11/29/2013   Procedure: EXCISION LIPOMA RIGHT AXILLA;  Surgeon: Autumn Messing III, MD;  Location: [facility];  Service: General;  Laterality: Right;   Current Outpatient Prescriptions on File Prior to Visit  Medication Sig Dispense Refill  . alfuzosin (UROXATRAL) 10 MG 24 hr tablet Take 10 mg by mouth daily with breakfast.    . amLODipine (NORVASC) 5 MG tablet Take 1 tablet (5 mg total) by mouth daily. 90 tablet 1  . Blood Gluc Meter Disp-Strips (BLOOD GLUCOSE METER DISPOSABLE) DEVI Please given strips of  type to work w/ pt's meter. Needs to check tid due to medication change, hyperglycemia 100 each 11  . Dulaglutide (TRULICITY) 1.5 JO/8.7OM SOPN Inject 1 application into the skin once a week. 4 pen 2  . fenofibrate (TRICOR) 145 MG tablet TAKE ONE TABLET BY MOUTH ONCE DAILY 30 tablet 11  . metFORMIN (GLUCOPHAGE) 1000 MG tablet TAKE 1 TABLET BY MOUTH ONCE DAILY FOR 3 DAYS THEN  1  TABLET BY MOUTH  TWICE  DAILY  AFTER  THAT 60 tablet 1  . polyethylene glycol powder (GLYCOLAX/MIRALAX) powder Take 34 g by mouth daily. 500 g 1   No current facility-administered medications on file prior to visit.    Allergies  Allergen Reactions  . 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 Ace Inhibitors     See 05/23/14 angioedema with Losartan   Family History  Problem Relation Age of Onset  . Cancer Father        lung  . Diabetes Father   . Hypertension Father   . Hyperlipidemia Mother   . Hypertension Mother    Social History   Social History  . Marital status: Single    Spouse name: N/A  . Number of children: 2  . Years of education: 12   Occupational History  .  Custodian    Social History Main Topics  . Smoking status: Never Smoker  . Smokeless tobacco: Never Used  . Alcohol use No  . Drug use: No  . Sexual activity: Yes   Other Topics Concern  . None   Social History Narrative   Born in [place], [place] and raised [place], [place]. Currently resides in a house with his mother. No pets. Fun: Go to sporting events, concerts   Denies religious beliefs that would effect health care.    Depression screen [facility] 2/9 12/23/2016 10/14/2016 09/02/2016 08/15/2016 08/13/2016  Decreased Interest 0 0 0 0 0  Down, Depressed, Hopeless 0 0 0 0 0  PHQ - 2 Score 0 0 0 0 0    Review of Systems See hpi    Objective:   Physical Exam  Constitutional: He appears well-developed and well-nourished. No distress.  HENT:  Head: Normocephalic and atraumatic.  Neck: Normal range of motion. Neck supple. No  thyromegaly present.  Cardiovascular: Normal rate, regular rhythm and normal heart sounds.   Pulmonary/Chest: Effort normal and breath sounds normal.  Abdominal: Soft. Normal appearance. He exhibits no distension and no mass. Bowel sounds are decreased. There is no hepatosplenomegaly. There is tenderness in the left lower quadrant. There is no rigidity, no rebound, no guarding, no CVA tenderness, no tenderness at McBurney's point and negative Murphy's sign. No hernia.  Genitourinary: Rectum normal and prostate normal. Rectal exam shows no tenderness, anal tone normal and guaiac negative stool.  Lymphadenopathy:    He has no cervical adenopathy.  Skin: He is not diaphoretic.    BP 134/85   Pulse 82   Temp 98.6 F (37 C) (Oral)   Resp 18   Ht 5\' 7"  (1.702 m)   Wt 226 lb 12.8 oz (102.9 kg)   SpO2 94%   BMI 35.52 kg/m   Results for orders placed or performed in visit on 12/23/16  POCT urinalysis dipstick  Result Value Ref Range   Color, UA yellow yellow   Clarity, UA clear clear   Glucose, UA negative negative mg/dL   Bilirubin, UA negative negative   Ketones, POC UA negative negative mg/dL   Spec Grav, UA >=1.030 (A) 1.010 - 1.025   Blood, UA negative negative   pH, UA 6.0 5.0 - 8.0   Protein Ur, POC negative negative mg/dL   Urobilinogen, UA 0.2 0.2 or 1.0 E.U./dL   Nitrite, UA Negative Negative   Leukocytes, UA Negative Negative  POCT Microscopic Urinalysis ([facility])  Result Value Ref Range   WBC,UR,HPF,POC None None WBC/hpf   RBC,UR,HPF,POC None None RBC/hpf   Bacteria None None, Too numerous to count   Mucus Absent Absent   Epithelial Cells, UR Per Microscopy None None, Too numerous to count cells/hpf  POCT CBC  Result Value Ref Range   WBC 5.4 4.6 - 10.2 K/uL   Lymph, poc 1.6 0.6 - 3.4   POC LYMPH PERCENT 30.0 10 - 50 %L   MID (cbc) 0.4 0 - 0.9   POC MID % 7.3 0 - 12 %M   POC Granulocyte 3.4 2 - 6.9   Granulocyte percent 62.7 37 - 80 %G   RBC 4.49 (A) 4.69 - 6.13  M/uL   Hemoglobin 14.5 14.1 - 18.1 g/dL   HCT, POC 42.7 (A) 43.5 - 53.7 %   MCV 95.3 80 - 97 fL   MCH, POC 32.3 (A) 27 - 31.2 pg   MCHC 33.9 31.8 - 35.4 g/dL   RDW,  POC 12.9 %   Platelet Count, POC 215 142 - 424 K/uL   MPV 6.9 0 - 99.8 fL  POC Hemoccult Bld/Stl (1-Cd Office Dx)  Result Value Ref Range   Card #1 Date 12/23/16    Fecal Occult Blood, POC Negative Negative    Dg Abd Acute W/chest  Result Date: 12/23/2016 CLINICAL DATA:  Left lower quadrant abdominal pain. EXAM: DG ABDOMEN ACUTE W/ 1V CHEST COMPARISON:  June 12, 2016. FINDINGS: There is no evidence of dilated bowel loops or free intraperitoneal air. No radiopaque calculi or other significant radiographic abnormality is seen. Heart size and mediastinal contours are within normal limits. Bibasilar atelectasis. No pleural effusions. No acute osseous abnormality. IMPRESSION: Negative abdominal radiographs.  No acute cardiopulmonary disease. Electronically Signed   By: Titus Dubin M.D.   On: 12/23/2016 16:30       Assessment & Plan:   1. Left lower quadrant pain   2. Diarrhea of presumed infectious origin     Orders Placed This Encounter  Procedures  . DG Abd Acute W/Chest    Standing Status:   Future    Number of Occurrences:   1    Standing Expiration Date:   12/23/2017    Order Specific Question:   Reason for exam:    Answer:   left sided abdominal pain - mainly LLQ x 2d after eating bad food. + gerd, nausea, + diarrhea;    Order Specific Question:   Preferred imaging location?    Answer:   External  . Comprehensive metabolic panel  . Lipase  . GI Profile, Stool, PCR  . POCT urinalysis dipstick  . POCT Microscopic Urinalysis ([facility])  . POCT CBC  . POC Hemoccult Bld/Stl (1-Cd Office Dx)    Meds ordered this encounter  Medications  . gi cocktail (Maalox,Lidocaine,Donnatal)  . ciprofloxacin (CIPRO) 500 MG tablet    Sig: Take 1 tablet (500 mg total) by mouth 2 (two) times daily.    Dispense:  6 tablet     Refill:  0  . ondansetron (ZOFRAN ODT) 4 MG disintegrating tablet    Sig: Take 1 tablet (4 mg total) by mouth every 8 (eight) hours as needed for nausea or vomiting.    Dispense:  20 tablet    Refill:  0  . omeprazole (PRILOSEC) 40 MG capsule    Sig: Take 1 capsule (40 mg total) by mouth 2 (two) times daily before a meal. For 1-2 weeks    Dispense:  60 capsule    Refill:  0    [name], M.D.  Primary Care at [facility] [address] [place], [place] [address] [phone] phone [phone] fax  12/25/16 11:35 PM

## 2016-12-23 NOTE — Patient Instructions (Addendum)
Double up on your omeprazole to take that twice a day.  After you collect the stool sample, you can try a course of the cipro if your symptoms aren't improving   IF you received an x-ray today, you will receive an invoice from [facility]. Please contact [facility] at [phone] with questions or concerns regarding your invoice.   IF you received labwork today, you will receive an invoice from [facility]. Please contact [facility] at [phone] with questions or concerns regarding your invoice.   Our billing staff will not be able to assist you with questions regarding bills from these companies.  You will be contacted with the lab results as soon as they are available. The fastest way to get your results is to activate your My Chart account. Instructions are located on the last page of this paperwork. If you have not heard from [place] regarding the results in 2 weeks, please contact this office.      Food Choices to Help Relieve Diarrhea, Adult When you have diarrhea, the foods you eat and your eating habits are very important. Choosing the right foods and drinks can help:  Relieve diarrhea.  Replace lost fluids and nutrients.  Prevent dehydration.  What general guidelines should I follow? Relieving diarrhea  Choose foods with less than 2 g or .07 oz. of fiber per serving.  Limit fats to less than 8 tsp (38 g or 1.34 oz.) a day.  Avoid the following: ? Foods and beverages sweetened with high-fructose corn syrup, honey, or sugar alcohols such as xylitol, sorbitol, and mannitol. ? Foods that contain a lot of fat or sugar. ? Fried, greasy, or spicy foods. ? High-fiber grains, breads, and cereals. ? Raw fruits and vegetables.  Eat foods that are rich in probiotics. These foods include dairy products such as yogurt and fermented milk products. They help increase healthy bacteria in the stomach and intestines (gastrointestinal tract, or GI tract).  If you have  lactose intolerance, avoid dairy products. These may make your diarrhea worse.  Take medicine to help stop diarrhea (antidiarrheal medicine) only as told by your health care provider. Replacing nutrients  Eat small meals or snacks every 3-4 hours.  Eat bland foods, such as white rice, toast, or baked potato, until your diarrhea starts to get better. Gradually reintroduce nutrient-rich foods as tolerated or as told by your health care provider. This includes: ? Well-cooked protein foods. ? Peeled, seeded, and soft-cooked fruits and vegetables. ? Low-fat dairy products.  Take vitamin and mineral supplements as told by your health care provider. Preventing dehydration   Start by sipping water or a special solution to prevent dehydration (oral rehydration solution, ORS). Urine that is clear or pale yellow means that you are getting enough fluid.  Try to drink at least 8-10 cups of fluid each day to help replace lost fluids.  You may add other liquids in addition to water, such as clear juice or decaffeinated sports drinks, as tolerated or as told by your health care provider.  Avoid drinks with caffeine, such as coffee, tea, or soft drinks.  Avoid alcohol. What foods are recommended? The items listed may not be a complete list. Talk with your health care provider about what dietary choices are best for you. Grains White rice. White, [place], or pita breads (fresh or toasted), including plain rolls, buns, or bagels. White pasta. Saltine, soda, or graham crackers. Pretzels. Low-fiber cereal. Cooked cereals made with water (such as cornmeal, farina, or cream cereals). Plain  muffins. Matzo. Melba toast. Zwieback. Vegetables Potatoes (without the skin). Most well-cooked and canned vegetables without skins or seeds. Tender lettuce. Fruits Apple sauce. Fruits canned in juice. Cooked apricots, cherries, grapefruit, peaches, pears, or plums. Fresh bananas and cantaloupe. Meats and other protein  foods Baked or boiled chicken. Eggs. Tofu. Fish. Seafood. Smooth nut butters. Ground or well-cooked tender beef, ham, veal, lamb, pork, or poultry. Dairy Plain yogurt, kefir, and unsweetened liquid yogurt. Lactose-free milk, buttermilk, skim milk, or soy milk. Low-fat or nonfat hard cheese. Beverages Water. Low-calorie sports drinks. Fruit juices without pulp. Strained tomato and vegetable juices. Decaffeinated teas. Sugar-free beverages not sweetened with sugar alcohols. Oral rehydration solutions, if approved by your health care provider. Seasoning and other foods Bouillon, broth, or soups made from recommended foods. What foods are not recommended? The items listed may not be a complete list. Talk with your health care provider about what dietary choices are best for you. Grains Whole grain, whole wheat, bran, or rye breads, rolls, pastas, and crackers. Wild or brown rice. Whole grain or bran cereals. Barley. Oats and oatmeal. Corn tortillas or taco shells. Granola. Popcorn. Vegetables Raw vegetables. Fried vegetables. Cabbage, broccoli, Brussels sprouts, artichokes, baked beans, beet greens, corn, kale, legumes, peas, sweet potatoes, and yams. Potato skins. Cooked spinach and cabbage. Fruits Dried fruit, including raisins and dates. Raw fruits. Stewed or dried prunes. Canned fruits with syrup. Meat and other protein foods Fried or fatty meats. Deli meats. Chunky nut butters. Nuts and seeds. Beans and lentils. Berniece Salines. Hot dogs. Sausage. Dairy High-fat cheeses. Whole milk, chocolate milk, and beverages made with milk, such as milk shakes. Half-and-half. Cream. sour cream. Ice cream. Beverages Caffeinated beverages (such as coffee, tea, soda, or energy drinks). Alcoholic beverages. Fruit juices with pulp. Prune juice. Soft drinks sweetened with high-fructose corn syrup or sugar alcohols. High-calorie sports drinks. Fats and oils Butter. Cream sauces. Margarine. Salad oils. Plain salad  dressings. Olives. Avocados. Mayonnaise. Sweets and desserts Sweet rolls, doughnuts, and sweet breads. Sugar-free desserts sweetened with sugar alcohols such as xylitol and sorbitol. Seasoning and other foods Honey. Hot sauce. Chili powder. Gravy. Cream-based or milk-based soups. Pancakes and waffles. Summary  When you have diarrhea, the foods you eat and your eating habits are very important.  Make sure you get at least 8-10 cups of fluid each day, or enough to keep your urine clear or pale yellow.  Eat bland foods and gradually reintroduce healthy, nutrient-rich foods as tolerated, or as told by your health care provider.  Avoid high-fiber, fried, greasy, or spicy foods. This information is not intended to replace advice given to you by your health care provider. Make sure you discuss any questions you have with your health care provider. Document Released: 05/30/2003 Document Revised: 03/06/2016 Document Reviewed: 03/06/2016 Elsevier Interactive Patient Education  2017 [place] poisoning is an illness that is caused by eating or drinking contaminated foods or drinks. In most cases, food poisoning is mild and lasts 1-2 days. However, some cases can be serious, especially for people who have weak body defense (immune) systems, older people, children and infants, and pregnant women. What are the causes? Foods can become contaminated with viruses, bacteria, parasites, mold, or chemicals as a result of:  Poor personal hygiene, such as poor hand washing practices.  Storing food improperly, such as not refrigerating raw meat.  Using unclean surfaces for serving, preparing, and storing food.  Cooking or eating with unclean utensils.  If contaminated food is  eaten, viruses, bacteria, or parasites can harm the intestine. This often causes severe diarrhea. The most common causes of food poisoning include:  Viruses, such as: ? Norovirus. ? Rotavirus.  Bacteria,  such as: ? Salmonella. ? Listeria. ? E. coli (Escherichia coli).  Parasites, such as: ? Giardia. ? Toxoplasmosis.  What are the signs or symptoms? Symptoms may take several hours to appear after you consume contaminated food or drink. Symptoms include:  Nausea.  Vomiting.  Cramping.  Diarrhea.  Fever and chills.  Muscle aches.  Dehydration. Dehydration can cause you to be tired and thirsty, have a dry mouth, and urinate less frequently.  How is this diagnosed? Your health care provider can diagnose food poisoning with a medical history and physical exam. This will include asking you what you have recently eaten. You may also have tests, including:  Blood tests.  Stool tests.  How is this treated? Treatment focuses on relieving your symptoms and making sure that you are hydrated. You may also be given medicines. In severe cases, hospitalization may be required and you may need to receive fluids through an IV tube. Follow these instructions at home: Eating and drinking   Drink enough fluids to keep your urine clear or pale yellow. You may need to drink small amounts of clear liquids frequently.  Avoid milk, caffeine, and alcohol.  Ask your health care provider for specific rehydration instructions.  Eat small, frequent meals rather than large meals. Medicines  Take over-the-counter and prescription medicines only as told by your health care provider. Ask your health care provider if you should continue to take any of your regular prescribed and over-the-counter medicines.  If you were prescribed an antibiotic medicine, take it as told by your health care provider. Do not stop taking the antibiotic even if you start to feel better. General instructions  Wash your hands thoroughly before you prepare food and after you go to the bathroom (use the toilet). Make sure people who live with you also wash their hands often.  Clean surfaces that you touch with a product  that contains chlorine bleach.  Keep all follow-up visits as told by your health care provider. This is important. How is this prevented?  Wash your hands, food preparation surfaces, and utensils thoroughly before and after you handle raw foods.  Use separate food preparation surfaces and storage spaces for raw meat and for fruits and vegetables.  Keep refrigerated foods colder than 17F (5C).  Serve hot foods immediately or keep them heated above 117F (60C).  Store dry foods in cool, dry spaces away from excess heat or moisture. Throw out any foods that do not smell right or are in cans that are bulging.  Follow approved canning procedures.  Heat canned foods thoroughly before you taste them.  Drink bottled or sterile water when you travel. Get help right away if: Call 911 or go to the emergency room if:  You have difficulty breathing, swallowing, talking, or moving.  You develop blurred vision.  You cannot eat or drink without vomiting.  You faint.  Your eyes turn yellow.  Your vomiting or diarrhea is persistent.  Abdominal pain develops, increases, or localizes in one small area.  You have a fever.  You have blood or mucus in your stools, or your stools look dark black and tarry.  You have signs of dehydration, such as: ? Dark urine, very little urine, or no urine. ? Cracked lips. ? Not making tears while crying. ? Dry mouth. ?  Sunken eyes. ? Sleepiness. ? Weakness. ? Dizziness.  This information is not intended to replace advice given to you by your health care provider. Make sure you discuss any questions you have with your health care provider. Document Released: 12/06/2003 Document Revised: 08/06/2015 Document Reviewed: 09/10/2014 Elsevier Interactive Patient Education  [employer].

## 2016-12-24 LAB — COMPREHENSIVE METABOLIC PANEL
A/G RATIO: 2 (ref 1.2–2.2)
ALBUMIN: 4.7 g/dL (ref 3.5–5.5)
ALK PHOS: 69 IU/L (ref 39–117)
ALT: 15 IU/L (ref 0–44)
AST: 17 IU/L (ref 0–40)
BILIRUBIN TOTAL: 0.6 mg/dL (ref 0.0–1.2)
BUN / CREAT RATIO: 8 — AB (ref 9–20)
BUN: 11 mg/dL (ref 6–24)
CHLORIDE: 102 mmol/L (ref 96–106)
CO2: 23 mmol/L (ref 20–29)
Calcium: 9.7 mg/dL (ref 8.7–10.2)
Creatinine, Ser: 1.41 mg/dL — ABNORMAL HIGH (ref 0.76–1.27)
GFR calc Af Amer: 67 mL/min/{1.73_m2} (ref >59)
GFR calc non Af Amer: 58 mL/min/{1.73_m2} — ABNORMAL LOW (ref >59)
GLOBULIN, TOTAL: 2.3 g/dL (ref 1.5–4.5)
GLUCOSE: 132 mg/dL — AB (ref 65–99)
POTASSIUM: 4 mmol/L (ref 3.5–5.2)
SODIUM: 139 mmol/L (ref 134–144)
Total Protein: 7 g/dL (ref 6.0–8.5)

## 2016-12-24 LAB — LIPASE: LIPASE: 36 U/L (ref 13–78)

## 2016-12-26 ENCOUNTER — Other Ambulatory Visit: Payer: Self-pay | Admitting: Family

## 2016-12-28 LAB — GI PROFILE, STOOL, PCR
ADENOVIRUS F 40/41: NOT DETECTED
ASTROVIRUS: NOT DETECTED
C DIFFICILE TOXIN A/B: NOT DETECTED
CAMPYLOBACTER: NOT DETECTED
Cryptosporidium: NOT DETECTED
Cyclospora cayetanensis: NOT DETECTED
ENTEROAGGREGATIVE E COLI: NOT DETECTED
ENTEROPATHOGENIC E COLI: NOT DETECTED
Entamoeba histolytica: NOT DETECTED
Enterotoxigenic E coli: NOT DETECTED
GIARDIA LAMBLIA: NOT DETECTED
NOROVIRUS GI/GII: NOT DETECTED
Plesiomonas shigelloides: NOT DETECTED
ROTAVIRUS A: NOT DETECTED
SHIGELLA/ENTEROINVASIVE E COLI: NOT DETECTED
Salmonella: NOT DETECTED
Sapovirus: NOT DETECTED
Shiga-toxin-producing E coli: NOT DETECTED
Vibrio cholerae: NOT DETECTED
Vibrio: NOT DETECTED
YERSINIA ENTEROCOLITICA: NOT DETECTED

## 2017-03-10 ENCOUNTER — Ambulatory Visit: Payer: BC Managed Care – PPO | Admitting: Nurse Practitioner

## 2017-03-20 ENCOUNTER — Other Ambulatory Visit: Payer: Self-pay | Admitting: Family

## 2017-03-22 ENCOUNTER — Telehealth: Payer: Self-pay | Admitting: Family Medicine

## 2017-03-22 NOTE — Telephone Encounter (Signed)
Patient came into clinic today to get his Metform refilled stated he was seeing Dr Elna Breslow but that he went to a different practice and he does not have a PCP anymore.  He uses the [facility] on [address].  His call back number is [phone]

## 2017-03-24 NOTE — Telephone Encounter (Signed)
Pt has app on 04/03/17 this is the first ava time he can come in.

## 2017-03-24 NOTE — Telephone Encounter (Signed)
Pt is checking on status of this message.  °

## 2017-03-25 NOTE — Telephone Encounter (Signed)
Please advise. Pt has appointment with you on 03/27/2017

## 2017-03-26 ENCOUNTER — Other Ambulatory Visit: Payer: Self-pay | Admitting: *Deleted

## 2017-03-26 MED ORDER — METFORMIN HCL 1000 MG PO TABS: ORAL_TABLET | ORAL | 0 refills | Status: DC

## 2017-03-26 NOTE — Telephone Encounter (Signed)
Please giave pt my apologies that this took [place] so long to get back to him. 3 mos of metformin sent to pharmacy - recheck before further refills.

## 2017-03-27 ENCOUNTER — Ambulatory Visit: Payer: BC Managed Care – PPO | Admitting: Family Medicine

## 2017-03-27 ENCOUNTER — Other Ambulatory Visit: Payer: Self-pay

## 2017-03-27 ENCOUNTER — Encounter: Payer: Self-pay | Admitting: Family Medicine

## 2017-03-27 VITALS — BP 128/88 | HR 87 | Resp 16 | Ht 67.0 in | Wt 240.0 lb

## 2017-03-27 DIAGNOSIS — I200 Unstable angina: Secondary | ICD-10-CM

## 2017-03-27 DIAGNOSIS — E1165 Type 2 diabetes mellitus with hyperglycemia: Secondary | ICD-10-CM

## 2017-03-27 DIAGNOSIS — R0789 Other chest pain: Secondary | ICD-10-CM | POA: Diagnosis not present

## 2017-03-27 LAB — POCT URINALYSIS DIP (MANUAL ENTRY)
BILIRUBIN UA: NEGATIVE
Glucose, UA: 100 mg/dL — AB
Ketones, POC UA: NEGATIVE mg/dL
Leukocytes, UA: NEGATIVE
Nitrite, UA: NEGATIVE
Protein Ur, POC: NEGATIVE mg/dL
Urobilinogen, UA: 0.2 E.U./dL
pH, UA: 8.5 — AB (ref 5.0–8.0)

## 2017-03-27 LAB — GLUCOSE, POCT (MANUAL RESULT ENTRY): POC GLUCOSE: 203 mg/dL — AB (ref 70–99)

## 2017-03-27 MED ORDER — RANITIDINE HCL 150 MG PO TABS: 150.0000 mg | ORAL_TABLET | Freq: Two times a day (BID) | ORAL | 3 refills | Status: DC | PRN

## 2017-03-27 MED ORDER — ASPIRIN EC 81 MG PO TBEC: 81.0000 mg | DELAYED_RELEASE_TABLET | Freq: Every day | ORAL | Status: DC

## 2017-03-27 MED ORDER — PANTOPRAZOLE SODIUM 40 MG PO TBEC
40.0000 mg | DELAYED_RELEASE_TABLET | Freq: Every day | ORAL | 3 refills | Status: DC
Start: 2017-03-27 — End: 2017-06-24

## 2017-03-27 NOTE — Patient Instructions (Addendum)
Start an aspirin 81mg  EC (enteric coated) tablet daily. If you have any chest pain - chew 4 of these (if you get the "baby aspirin" that are chewable, this will be a much more pleasant experience.) If the pain is not relieved in 5 minutes, call 911.  Stay on the GERD diet below.  I would like you to see cardiology this week to ensure that this pain is not the warnings that you are close to having a heart attack.   IF you received an x-ray today, you will receive an invoice from [facility]. Please contact [facility] at [phone] with questions or concerns regarding your invoice.   IF you received labwork today, you will receive an invoice from [facility]. Please contact [facility] at [phone] with questions or concerns regarding your invoice.   Our billing staff will not be able to assist you with questions regarding bills from these companies.  You will be contacted with the lab results as soon as they are available. The fastest way to get your results is to activate your My Chart account. Instructions are located on the last page of this paperwork. If you have not heard from [place] regarding the results in 2 weeks, please contact this office.     Food Choices for Gastroesophageal Reflux Disease, Adult When you have gastroesophageal reflux disease (GERD), the foods you eat and your eating habits are very important. Choosing the right foods can help ease the discomfort of GERD. Consider working with a diet and nutrition specialist (dietitian) to help you make healthy food choices. What general guidelines should I follow? Eating plan  Choose healthy foods low in fat, such as fruits, vegetables, whole grains, low-fat dairy products, and lean meat, fish, and poultry.  Eat frequent, small meals instead of three large meals each day. Eat your meals slowly, in a relaxed setting. Avoid bending over or lying down until 2-3 hours after eating.  Limit high-fat foods such as fatty  meats or fried foods.  Limit your intake of oils, butter, and shortening to less than 8 teaspoons each day.  Avoid the following: ? Foods that cause symptoms. These may be different for different people. Keep a food diary to keep track of foods that cause symptoms. ? Alcohol. ? Drinking large amounts of liquid with meals. ? Eating meals during the 2-3 hours before bed.  Cook foods using methods other than frying. This may include baking, grilling, or broiling. Lifestyle   Maintain a healthy weight. Ask your health care provider what weight is healthy for you. If you need to lose weight, work with your health care provider to do so safely.  Exercise for at least 30 minutes on 5 or more days each week, or as told by your health care provider.  Avoid wearing clothes that fit tightly around your waist and chest.  Do not use any products that contain nicotine or tobacco, such as cigarettes and e-cigarettes. If you need help quitting, ask your health care provider.  Sleep with the head of your bed raised. Use a wedge under the mattress or blocks under the bed frame to raise the head of the bed. What foods are not recommended? The items listed may not be a complete list. Talk with your dietitian about what dietary choices are best for you. Grains Pastries or quick breads with added fat. [place] toast. Vegetables Deep fried vegetables. [place] fries. Any vegetables prepared with added fat. Any vegetables that cause symptoms. For some people this may  include tomatoes and tomato products, chili peppers, onions and garlic, and horseradish. Fruits Any fruits prepared with added fat. Any fruits that cause symptoms. For some people this may include citrus fruits, such as oranges, grapefruit, pineapple, and lemons. Meats and other protein foods High-fat meats, such as fatty beef or pork, hot dogs, ribs, ham, sausage, salami and bacon. Fried meat or protein, including fried fish and fried chicken. Nuts  and nut butters. Dairy Whole milk and chocolate milk. Sour cream. Cream. Ice cream. Cream cheese. Milk shakes. Beverages Coffee and tea, with or without caffeine. Carbonated beverages. Sodas. Energy drinks. Fruit juice made with acidic fruits (such as orange or grapefruit). Tomato juice. Alcoholic drinks. Fats and oils Butter. Margarine. Shortening. Ghee. Sweets and desserts Chocolate and cocoa. Donuts. Seasoning and other foods Pepper. Peppermint and spearmint. Any condiments, herbs, or seasonings that cause symptoms. For some people, this may include curry, hot sauce, or vinegar-based salad dressings. Summary  When you have gastroesophageal reflux disease (GERD), food and lifestyle choices are very important to help ease the discomfort of GERD.  Eat frequent, small meals instead of three large meals each day. Eat your meals slowly, in a relaxed setting. Avoid bending over or lying down until 2-3 hours after eating.  Limit high-fat foods such as fatty meat or fried foods. This information is not intended to replace advice given to you by your health care provider. Make sure you discuss any questions you have with your health care provider. Document Released: 03/09/2005 Document Revised: 03/10/2016 Document Reviewed: 03/10/2016 Elsevier Interactive Patient Education  2018 [place].   Angina Pectoris Angina pectoris, often called angina, is extreme discomfort in the chest, neck, or arm. This is caused by a lack of blood in the middle and thickest layer of the heart wall (myocardium). There are four types of angina:  Stable angina. Stable angina usually occurs in episodes of predictable frequency and duration. It is usually brought on by physical activity, stress, or excitement. Stable angina usually lasts a few minutes and can often be relieved by a medicine that you place under your tongue. This medicine is called sublingual nitroglycerin.  Unstable angina. Unstable angina can occur  even when you are doing little or no physical activity. It can even occur while you are sleeping or when you are at rest. It can suddenly increase in severity or frequency. It may not be relieved by sublingual nitroglycerin, and it can last up to 30 minutes.  Microvascular angina. This type of angina is caused by a disorder of tiny blood vessels called arterioles. Microvascular angina is more common in women. The pain may be more severe and last longer than other types of angina pectoris.  Prinzmetal or variant angina. This type of angina pectoris is rare and usually occurs when you are doing little or no physical activity. It especially occurs in the early morning hours.  What are the causes? Atherosclerosis is the cause of angina. This is the buildup of fat and cholesterol (plaque) on the inside of the arteries. Over time, the plaque may narrow or block the artery, and this will lessen blood flow to the heart. Plaque can also become weak and break off within a coronary artery to form a clot and cause a sudden blockage. What increases the risk? Risk factors common to both men and women include:  High cholesterol levels.  High blood pressure (hypertension).  Tobacco use.  Diabetes.  Family history of angina.  Obesity.  Lack of exercise.  A diet high in saturated fats.  Women are at greater risk for angina if they are:  Over age 33.  Postmenopausal.  What are the signs or symptoms? Many people do not experience any symptoms during the early stages of angina. As the condition progresses, symptoms common to both men and women may include:  Chest pain. ? The pain can be described as a crushing or squeezing in the chest, or a tightness, pressure, fullness, or heaviness in the chest. ? The pain can last more than a few minutes, or it can stop and recur.  Pain in the arms, neck, jaw, or back.  Unexplained heartburn or indigestion.  Shortness of breath.  Nausea.  Sudden cold  sweats.  Sudden light-headedness.  Many women have chest discomfort and some of the other symptoms. However, women often have different (atypical) symptoms, such as:  Fatigue.  Unexplained feelings of nervousness or anxiety.  Unexplained weakness.  Dizziness or fainting.  Sometimes, women may have angina without any symptoms. How is this diagnosed? Tests to diagnose angina may include:  ECG (electrocardiogram).  Exercise stress test. This looks for signs of blockage when the heart is being exercised.  Pharmacologic stress test. This test looks for signs of blockage when the heart is being stressed with a medicine.  Blood tests.  Coronary angiogram. This is a procedure to look at the coronary arteries to see if there is any blockage.  How is this treated? The treatment of angina may include the following:  Healthy behavioral changes to reduce or control risk factors.  Medicine.  Coronary stenting.A stent helps to keep an artery open.  Coronary angioplasty. This procedure widens a narrowed or blocked artery.  Coronary arterybypass surgery. This will allow your blood to pass the blockage (bypass) to reach your heart.  Follow these instructions at home:  Take medicines only as directed by your health care provider.  Do not take the following medicines unless your health care provider approves: ? Nonsteroidal anti-inflammatory drugs (NSAIDs), such as ibuprofen, naproxen, or celecoxib. ? Vitamin supplements that contain vitamin A, vitamin E, or both. ? Hormone replacement therapy that contains estrogen with or without progestin.  Manage other health conditions such as hypertension and diabetes as directed by your health care provider.  Follow a heart-healthy diet. A dietitian can help to educate you about healthy food options and changes.  Use healthy cooking methods such as roasting, grilling, broiling, baking, poaching, steaming, or stir-frying. Talk to a dietitian  to learn more about healthy cooking methods.  Follow an exercise program approved by your health care provider.  Maintain a healthy weight. Lose weight as approved by your health care provider.  Plan rest periods when fatigued.  Learn to manage stress.  Do not use any tobacco products, including cigarettes, chewing tobacco, or electronic cigarettes. If you need help quitting, ask your health care provider.  If you drink alcohol, and your health care provider approves, limit your alcohol intake to no more than 1 drink per day. One drink equals 12 ounces of beer, 5 ounces of wine, or 1 ounces of hard liquor.  Stop illegal drug use.  Keep all follow-up visits as directed by your health care provider. This is important. Get help right away if:  You have pain in your chest, neck, arm, jaw, stomach, or back that lasts more than a few minutes, is recurring, or is unrelieved by taking sublingualnitroglycerin.  You have profuse sweating without cause.  You have unexplained: ? Heartburn  or indigestion. ? Shortness of breath or difficulty breathing. ? Nausea or vomiting. ? Fatigue. ? Feelings of nervousness or anxiety. ? Weakness. ? Diarrhea.  You have sudden light-headedness or dizziness.  You faint. These symptoms may represent a serious problem that is an emergency. Do not wait to see if the symptoms will go away. Get medical help right away. Call your local emergency services (911 in the [place] Do not drive yourself to the hospital. This information is not intended to replace advice given to you by your health care provider. Make sure you discuss any questions you have with your health care provider. Document Released: 03/09/2005 Document Revised: 08/21/2015 Document Reviewed: 07/11/2013 Elsevier Interactive Patient Education  2017 [place].  Acute Coronary Syndrome Acute coronary syndrome (ACS) is a serious problem in which there is suddenly not enough blood and oxygen  reaching the heart. ACS can result in chest pain or a heart attack. What are the causes? This condition may be caused by:  A buildup of fat and cholesterol inside of the arteries (atherosclerosis). This is the most common cause. The buildup (plaque) can cause the blood vessels in your heart (coronary arteries) to become narrow or blocked. Plaque can also break off to form a clot.  A coronary spasm.  A tearing of the coronary artery (spontaneous coronary artery dissection).  Low blood pressure (hypotension).  An abnormal heart beat (arrhythmia).  Using cocaine or methamphetamine.  What increases the risk? The following factors may make you more likely to develop this condition:  Age.  History of chest pain, heart attack, or stroke.  Being male.  Family history of chest pain, heart disease, or stroke.  Smoking.  Inactivity.  Being overweight.  High cholesterol.  High blood pressure (hypertension).  Diabetes.  Excessive alcohol use.  What are the signs or symptoms? Common symptoms of this condition include:  Chest pain. The pain may last long, or may stop and come back (recur). It may feel like: ? Crushing or squeezing. ? Tightness, pressure, fullness, or heaviness.  Arm, neck, jaw, or back pain.  Heartburn or indigestion.  Shortness of breath.  Nausea.  Sudden cold sweats.  Lightheadedness.  Dizziness.  Tiredness (fatigue).  Sometimes there are no symptoms. How is this diagnosed? This condition may be diagnosed through:  An electrocardiogram (ECG). This test records the impulses of the heart.  Blood tests.  A CT scan of the chest.  A coronary angiogram. This procedure checks for a blockage in the coronary arteries.  How is this treated? Treatment for this condition may include:  Oxygen.  Medicines, such as: ? Antiplatelet medicines and blood-thinning medicines, such as aspirin. These help prevent blood clots. ? Fibrinolytic therapy.  This breaks apart a blood clot. ? Blood pressure medicines. ? Nitroglycerin. ? Pain medicine. ? Cholesterol medicine.  A procedure called coronary angioplasty and stenting. This is done to widen a narrowed artery and keep it open.  Coronary artery bypass surgery. This allows blood to pass the blockage to reach your heart.  Cardiac rehabilitation. This is a program that helps improve your health and well-being. It includes exercise training, education, and counseling to help you recover.  Follow these instructions at home: Eating and drinking  Follow a heart-healthy, low-salt (sodium) diet.  Use healthy cooking methods such as roasting, grilling, broiling, baking, poaching, steaming, or stir-frying.  Talk to a dietitian to learn about healthy cooking methods and how to eat less sodium. Medicines  Take over-the-counter and prescription medicines only  as told by your health care provider.  Do not take these medicines unless your health care provider approves: ? Nonsteroidal anti-inflammatory drugs (NSAIDs), such as ibuprofen, naproxen, or celecoxib. ? Vitamin supplements that contain vitamin A or vitamin E. ? Hormone replacement therapy that contains estrogen. Activity  Join a cardiac rehabilitation program.  Ask your health care provider: ? What activities and exercises are safe for you. ? If you should follow specific instructions about lifting, driving, or climbing stairs.  If you are taking aspirin and another blood thinning medicine, avoid activities that are likely to result in an injury. The medicines can increase your risk of bleeding. Lifestyle  Do not use any products that contain nicotine or tobacco, such as cigarettes and e-cigarettes. If you need help quitting, ask your health care provider.  If you drink alcohol and your health care provider says it is okay to drink, limit your alcohol intake to no more than 1 drink per day. One drink equals 12 oz of beer, 5 oz of  wine, or 1 oz of hard liquor.  Maintain a healthy weight. If you need to lose weight, do it in a way that has been approved by your health care provider. General instructions  Tell all your health care providers about your heart condition, including your dentist. Some medicines can increase your risk of arrhythmia.  Manage other health conditions, such as hypertension and diabetes. These conditions affect your heart.  Learn ways to manage stress.  Get screened for depression, and seek treatment if needed.  Monitor your blood pressure if told by your health care provider.  Keep your vaccinations up to date. Get the annual influenza vaccine.  Keep all follow-up visits as told by your health care provider. This is important. Contact a health care provider if:  You feel overwhelmed or sad.  You have trouble with your daily activities. Get help right away if:  You have pain in your chest, neck, arm, jaw, stomach, or back that recurs, and: ? Lasts more than a few minutes. ? Is not relieved by taking the [place] health care provider prescribed.  You have unexplained: ? Heavy sweating. ? Heartburn or indigestion. ? Shortness of breath. ? Difficulty breathing. ? Nausea or vomiting. ? Fatigue. ? Nervousness or anxiety. ? Weakness. ? Diarrhea. ? Dark stools or blood in the stool.  You have sudden lightheadedness or dizziness.  Your blood pressure is higher than 180/120  You faint.  You feel like hurting yourself or think about taking your own life. These symptoms may represent a serious problem that is an emergency. Do not wait to see if the symptoms will go away. Get medical help right away. Call your local emergency services (911 in the [place] Do not drive yourself to the clinic or hospital. Summary  Acute coronary syndrome (ACS) is a when there is not enough blood and oxygen being supplied to the heart. ACS can result in chest pain or a heart attack.  Acute coronary  syndrome is a medical emergency. If you have any symptoms of this condition, get help right away.  Treatment includes oxygen, medicines, and procedures to open the blocked arteries and restore blood flow. This information is not intended to replace advice given to you by your health care provider. Make sure you discuss any questions you have with your health care provider. Document Released: 03/09/2005 Document Revised: 04/10/2016 Document Reviewed: 04/10/2016 Elsevier Interactive Patient Education  [employer].

## 2017-03-27 NOTE — Progress Notes (Signed)
Subjective:  By signing my name below, I, [name], attest that this documentation has been prepared under the direction and in the presence of [name], MD Electronically Signed: Ladene Valencia, ED Scribe 03/27/2017 at 10:11 AM.   Patient ID: Austin Valencia., male    DOB: Feb 15, 1966, 52 y.o.   MRN: [id number]  Chief Complaint  Patient presents with  . Heartburn    patient presents c/o acid reflux and heartburn x several days. Patient also c/o soreness in arm x 1 month   HPI Austin Valencia Austin Valencia. is a 52 y.o. male who presents to Primary Care at [facility] complaining of L arm soreness just below the shoulder x 1 month. Pt states that the arm pain began to radiate across his L chest several days ago. Last episode of cp was yesterday around 2:30 PM. He describes pain as an aching, burning sensation which occurs at rest and intermittently lasts 5-10 minutes before self-resolving. He has tried Omeprazole which improves heartburn but has not tried any medications for pain. Denies sob but has noticed that he does get winded more easily since gaining weight. Also denies diaphoresis, cough, nausea, vomiting, radiation of cp, cp with exertion.  DM Pt has been checking his blood glucose. Noticed that blood glucose elevated to 257 x 2 days ago since being out of Metformin but restarted medication last night.  Past Medical History:  Diagnosis Date  . Diabetes mellitus without complication ([place])   . GERD (gastroesophageal reflux disease)   . Headache(784.0)   . Hyperlipidemia   . Hypertension    Current Outpatient Medications on File Prior to Visit  Medication Sig Dispense Refill  . alfuzosin (UROXATRAL) 10 MG 24 hr tablet Take 10 mg by mouth daily with breakfast.    . amLODipine (NORVASC) 5 MG tablet Take 1 tablet (5 mg total) by mouth daily. 90 tablet 1  . Blood Gluc Meter Disp-Strips (BLOOD GLUCOSE METER DISPOSABLE) DEVI Please given strips of type to work w/ pt's meter. Needs to check tid due  to medication change, hyperglycemia 100 each 11  . Dulaglutide (TRULICITY) 1.5 WE/3.1VQ SOPN Inject 1 application into the skin once a week. 4 pen 2  . fenofibrate (TRICOR) 145 MG tablet TAKE ONE TABLET BY MOUTH ONCE DAILY 30 tablet 11  . metFORMIN (GLUCOPHAGE) 1000 MG tablet TAKE 1 TAB po bid 180 tablet 0  . omeprazole (PRILOSEC) 40 MG capsule Take 1 capsule (40 mg total) by mouth 2 (two) times daily before a meal. For 1-2 weeks 60 capsule 0  . ciprofloxacin (CIPRO) 500 MG tablet Take 1 tablet (500 mg total) by mouth 2 (two) times daily. (Patient not taking: Reported on 03/27/2017) 6 tablet 0  . ondansetron (ZOFRAN ODT) 4 MG disintegrating tablet Take 1 tablet (4 mg total) by mouth every 8 (eight) hours as needed for nausea or vomiting. (Patient not taking: Reported on 03/27/2017) 20 tablet 0  . polyethylene glycol powder (GLYCOLAX/MIRALAX) powder Take 34 g by mouth daily. (Patient not taking: Reported on 03/27/2017) 500 g 1   No current facility-administered medications on file prior to visit.    Allergies  Allergen Reactions  . 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 Ace Inhibitors     See 05/23/14 angioedema with Losartan   Past Surgical History:  Procedure Laterality Date  . HERNIA REPAIR     umbilical child  . LIPOMA EXCISION Right 11/29/2013   Procedure: EXCISION LIPOMA RIGHT AXILLA;  Surgeon: Eddie Dibbles  Daiva Nakayama, MD;  Location: [facility];  Service: General;  Laterality: Right;   Family History  Problem Relation Age of Onset  . Cancer Father        lung  . Diabetes Father   . Hypertension Father   . Hyperlipidemia Mother   . Hypertension Mother    Social History   Socioeconomic History  . Marital status: Single    Spouse name: None  . Number of children: 2  . Years of education: 24  . Highest education level: None  Social Needs  . Financial resource strain: None  . Food insecurity - worry: None  . Food insecurity - inability: None  . Transportation needs -  medical: None  . Transportation needs - non-medical: None  Occupational History  . Occupation: Custodian  Tobacco Use  . Smoking status: Never Smoker  . Smokeless tobacco: Never Used  Substance and Sexual Activity  . Alcohol use: No    Alcohol/week: 0.0 oz  . Drug use: No  . Sexual activity: Yes  Other Topics Concern  . None  Social History Narrative   Born in [place], [place] and raised [place], [place]. Currently resides in a house with his mother. No pets. Fun: Go to sporting events, concerts   Denies religious beliefs that would effect health care.    Depression screen [facility] 2/9 03/27/2017 12/23/2016 10/14/2016 09/02/2016 08/15/2016  Decreased Interest 0 0 0 0 0  Down, Depressed, Hopeless 0 0 0 0 0  PHQ - 2 Score 0 0 0 0 0    Review of Systems  Constitutional: Negative for diaphoresis.  Respiratory: Negative for cough and shortness of breath.   Cardiovascular: Positive for chest pain.  Gastrointestinal: Negative for nausea and vomiting.  Musculoskeletal: Positive for myalgias.      Objective:   Physical Exam  Constitutional: He is oriented to person, place, and time. He appears well-developed and well-nourished. No distress.  HENT:  Head: Normocephalic and atraumatic.  Eyes: Conjunctivae and EOM are normal.  Neck: Neck supple. No tracheal deviation present. No thyromegaly present.  Cardiovascular: Normal rate, regular rhythm, S1 normal, S2 normal and normal heart sounds.  Pulmonary/Chest: Effort normal and breath sounds normal. No respiratory distress. He exhibits no tenderness.  Chest wall nontender to palpation.  Musculoskeletal: Normal range of motion.  Lymphadenopathy:    He has no cervical adenopathy.  Neurological: He is alert and oriented to person, place, and time.  Skin: Skin is warm and dry.  Psychiatric: He has a normal mood and affect. His behavior is normal.  Nursing note and vitals reviewed.  BP 128/88 (BP Location: Left Arm, Patient Position: Sitting, Cuff Size:  Normal)   Pulse 87   Resp 16   Ht 5\' 7"  (1.702 m)   Wt 240 lb (108.9 kg)   SpO2 96%   BMI 37.59 kg/m     EKG reading done by [name], MD: NSR. When compared to prior EKG done 11/28/13, has developed flipped T-waves in V6 with question of 1 mm ST elevation in lead V2. Limb leads appear unchanged.  Assessment & Plan:   1. Uncontrolled type 2 diabetes mellitus with hyperglycemia (HCC) - a1c today 6.2 - cont metformin 1g bid and trulicity 1.5 mg qd - well controlled. Not on acei/arb due to allergy. Not on statin. . .  Not sure why  As is on tricor - will look into and likely need to start statin  2. Intermittent left-sided chest pain - see  cards asap to r/o UA as etiology. change ppi and start H2 blocker to try to r/o gerd as etiology  3. Unstable angina (HCC) - atypical but due to DM would like pt to see cardiology asap for 5-10 min of left sided CP at rest that has recently developed - prev was having left upper arm/deltoid pain 5-10 min at rest. No asstd sxs. Stat troponin today neg - CP was last night - about 14-16hrs prior. Non-specific EKG changes present Start asa 81 EC qd - chew 4 if pain occurs and call 911 if not resolved in 5 min.    Orders Placed This Encounter  Procedures  . Comprehensive metabolic panel    Order Specific Question:   Has the patient fasted?    Answer:   Yes  . Hemoglobin A1c  . Microalbumin/Creatinine Ratio, Urine  . Lipid panel    Order Specific Question:   Has the patient fasted?    Answer:   Yes  . Troponin I  . Specimen status report  . Comprehensive metabolic panel  . Lipid panel  . Hemoglobin A1c  . Ambulatory referral to Cardiology    Referral Priority:   Urgent    Referral Type:   Consultation    Referral Reason:   Specialty Services Required    Requested Specialty:   Cardiology    Number of Visits Requested:   1  . POCT urinalysis dipstick  . POCT glucose (manual entry)  . EKG 12-Lead    Meds ordered this encounter  Medications  .  aspirin EC 81 MG tablet    Sig: Take 1 tablet (81 mg total) by mouth daily.  . pantoprazole (PROTONIX) 40 MG tablet    Sig: Take 1 tablet (40 mg total) by mouth daily. 30 minutes before a meal    Dispense:  30 tablet    Refill:  3  . ranitidine (ZANTAC) 150 MG tablet    Sig: Take 1 tablet (150 mg total) by mouth 2 (two) times daily as needed for heartburn.    Dispense:  60 tablet    Refill:  3    I personally performed the services described in this documentation, which was scribed in my presence. The recorded information has been reviewed and considered, and addended by me as needed.   [name], M.D.  Primary Care at [facility] [address] [place], [place] [address] [phone] phone [phone] fax  03/29/17 6:33 PM

## 2017-03-28 LAB — SPECIMEN STATUS REPORT

## 2017-03-28 LAB — HEMOGLOBIN A1C
ESTIMATED AVERAGE GLUCOSE: 131 mg/dL
HEMOGLOBIN A1C: 6.2 % — AB (ref 4.8–5.6)

## 2017-03-28 LAB — MICROALBUMIN / CREATININE URINE RATIO
Creatinine, Urine: 261.4 mg/dL
Microalb/Creat Ratio: 11.8 mg/g creat (ref 0.0–30.0)
Microalbumin, Urine: 30.8 ug/mL

## 2017-03-28 LAB — COMPREHENSIVE METABOLIC PANEL
A/G RATIO: 1.9 (ref 1.2–2.2)
ALBUMIN: 4.6 g/dL (ref 3.5–5.5)
ALBUMIN: 4.6 g/dL (ref 3.5–5.5)
ALK PHOS: 69 IU/L (ref 39–117)
ALT: 23 IU/L (ref 0–44)
ALT: 24 IU/L (ref 0–44)
AST: 16 IU/L (ref 0–40)
AST: 18 IU/L (ref 0–40)
Albumin/Globulin Ratio: 1.9 (ref 1.2–2.2)
Alkaline Phosphatase: 70 IU/L (ref 39–117)
BUN / CREAT RATIO: 7 — AB (ref 9–20)
BUN / CREAT RATIO: 7 — AB (ref 9–20)
BUN: 11 mg/dL (ref 6–24)
BUN: 11 mg/dL (ref 6–24)
Bilirubin Total: 0.3 mg/dL (ref 0.0–1.2)
CALCIUM: 10.1 mg/dL (ref 8.7–10.2)
CHLORIDE: 102 mmol/L (ref 96–106)
CO2: 21 mmol/L (ref 20–29)
CO2: 21 mmol/L (ref 20–29)
CREATININE: 1.52 mg/dL — AB (ref 0.76–1.27)
Calcium: 10.1 mg/dL (ref 8.7–10.2)
Chloride: 103 mmol/L (ref 96–106)
Creatinine, Ser: 1.55 mg/dL — ABNORMAL HIGH (ref 0.76–1.27)
GFR calc non Af Amer: 52 mL/min/{1.73_m2} — ABNORMAL LOW (ref >59)
GFR calc non Af Amer: 53 mL/min/{1.73_m2} — ABNORMAL LOW (ref >59)
GFR, EST AFRICAN AMERICAN: 60 mL/min/{1.73_m2} (ref >59)
GFR, EST AFRICAN AMERICAN: 61 mL/min/{1.73_m2} (ref >59)
GLUCOSE: 196 mg/dL — AB (ref 65–99)
Globulin, Total: 2.4 g/dL (ref 1.5–4.5)
Globulin, Total: 2.4 g/dL (ref 1.5–4.5)
Glucose: 201 mg/dL — ABNORMAL HIGH (ref 65–99)
POTASSIUM: 4.2 mmol/L (ref 3.5–5.2)
Potassium: 4.2 mmol/L (ref 3.5–5.2)
Sodium: 140 mmol/L (ref 134–144)
Sodium: 140 mmol/L (ref 134–144)
TOTAL PROTEIN: 7 g/dL (ref 6.0–8.5)
TOTAL PROTEIN: 7 g/dL (ref 6.0–8.5)

## 2017-03-28 LAB — LIPID PANEL
CHOL/HDL RATIO: 4.8 ratio (ref 0.0–5.0)
CHOLESTEROL TOTAL: 121 mg/dL (ref 100–199)
Chol/HDL Ratio: 4.6 ratio (ref 0.0–5.0)
Cholesterol, Total: 119 mg/dL (ref 100–199)
HDL: 25 mg/dL — AB (ref >39)
HDL: 26 mg/dL — ABNORMAL LOW (ref >39)
LDL CALC: 34 mg/dL (ref 0–99)
LDL CALC: 31 mg/dL (ref 0–99)
TRIGLYCERIDES: 312 mg/dL — AB (ref 0–149)
Triglycerides: 310 mg/dL — ABNORMAL HIGH (ref 0–149)
VLDL CHOLESTEROL CAL: 62 mg/dL — AB (ref 5–40)
VLDL CHOLESTEROL CAL: 62 mg/dL — AB (ref 5–40)

## 2017-03-29 ENCOUNTER — Telehealth: Payer: Self-pay | Admitting: Family Medicine

## 2017-03-29 LAB — TROPONIN I: Troponin I: <0.01 ng/mL (ref 0.00–0.04)

## 2017-03-29 MED ORDER — AMLODIPINE BESYLATE 5 MG PO TABS: 5.0000 mg | ORAL_TABLET | Freq: Every day | ORAL | 1 refills | Status: DC

## 2017-03-29 MED ORDER — METFORMIN HCL 1000 MG PO TABS: ORAL_TABLET | ORAL | 1 refills | Status: DC

## 2017-03-29 MED ORDER — DULAGLUTIDE 1.5 MG/0.5ML ~~LOC~~ SOAJ: 1.0000 "application " | SUBCUTANEOUS | 2 refills | Status: DC

## 2017-03-29 NOTE — Telephone Encounter (Signed)
Done. thanks

## 2017-03-29 NOTE — Telephone Encounter (Signed)
Pt has urgent referral in for cardiology. Will send and get pt scheduled, just need OV notes closed. Thanks!

## 2017-03-30 NOTE — Telephone Encounter (Signed)
Spoke with [facility]. Their first available is 04/12/17 and they scheduled pt for this before I called. I told them I would speak with the provider to see if we were going to keep this appt for pt or try another office. Please advise. We will be happy to try another office if desired.

## 2017-03-31 NOTE — Telephone Encounter (Signed)
That will be fine thanks. I doubt anywhere else can get him in sooner.

## 2017-04-03 ENCOUNTER — Ambulatory Visit: Payer: BC Managed Care – PPO | Admitting: Family Medicine

## 2017-04-12 ENCOUNTER — Emergency Department (HOSPITAL_COMMUNITY)
Admission: EM | Admit: 2017-04-12 | Discharge: 2017-04-12 | Disposition: A | Discharge status: Home / Self Care | Payer: BC Managed Care – PPO | Attending: Emergency Medicine | Admitting: Emergency Medicine

## 2017-04-12 ENCOUNTER — Encounter (HOSPITAL_COMMUNITY): Payer: Self-pay | Admitting: *Deleted

## 2017-04-12 ENCOUNTER — Other Ambulatory Visit: Payer: Self-pay

## 2017-04-12 DIAGNOSIS — R369 Urethral discharge, unspecified: Secondary | ICD-10-CM | POA: Diagnosis present

## 2017-04-12 DIAGNOSIS — Z79899 Other long term (current) drug therapy: Secondary | ICD-10-CM | POA: Insufficient documentation

## 2017-04-12 DIAGNOSIS — A64 Unspecified sexually transmitted disease: Secondary | ICD-10-CM | POA: Diagnosis not present

## 2017-04-12 DIAGNOSIS — I10 Essential (primary) hypertension: Secondary | ICD-10-CM | POA: Insufficient documentation

## 2017-04-12 DIAGNOSIS — E119 Type 2 diabetes mellitus without complications: Secondary | ICD-10-CM | POA: Insufficient documentation

## 2017-04-12 DIAGNOSIS — Z7984 Long term (current) use of oral hypoglycemic drugs: Secondary | ICD-10-CM | POA: Insufficient documentation

## 2017-04-12 DIAGNOSIS — Z7982 Long term (current) use of aspirin: Secondary | ICD-10-CM | POA: Insufficient documentation

## 2017-04-12 DIAGNOSIS — F329 Major depressive disorder, single episode, unspecified: Secondary | ICD-10-CM | POA: Insufficient documentation

## 2017-04-12 DIAGNOSIS — N342 Other urethritis: Secondary | ICD-10-CM

## 2017-04-12 LAB — CBG MONITORING, ED: Glucose-Capillary: 130 mg/dL — ABNORMAL HIGH (ref 65–99)

## 2017-04-12 MED ORDER — STERILE WATER FOR INJECTION IJ SOLN
INTRAMUSCULAR | Status: AC
  Administered 2017-04-12: 2.1 mL
  Filled 2017-04-12: qty 10

## 2017-04-12 MED ORDER — CEFTRIAXONE SODIUM 250 MG IJ SOLR
250.0000 mg | Freq: Once | INTRAMUSCULAR | Status: AC
  Administered 2017-04-12: 250 mg via INTRAMUSCULAR
  Filled 2017-04-12: qty 250

## 2017-04-12 MED ORDER — DOXYCYCLINE HYCLATE 100 MG PO CAPS: 100.0000 mg | ORAL_CAPSULE | Freq: Two times a day (BID) | ORAL | 0 refills | Status: DC

## 2017-04-12 NOTE — ED Triage Notes (Signed)
Pt c/o some yellow discharge to his penis when he woke up this am; pt denies any pain or burning at this time

## 2017-04-12 NOTE — ED Provider Notes (Signed)
[facility] EMERGENCY DEPARTMENT Provider Note   CSN: [id number] Arrival date & time: 04/12/17  0010  Time seen 12:55 AM   History   Chief Complaint Chief Complaint  Patient presents with  . SEXUALLY TRANSMITTED DISEASE    HPI Austin Valencia. is a 52 y.o. male.  HPI patient reports he has had a new sexual partner that they did not use condoms.  He states this morning he woke up having penile discharge.  He denies any redness or swelling of his actual penis.  He denies dysuria or frequency.  He states he has had STD before but many years ago.  Patient is also diabetic and states his CBG was 126 this morning.  PCP [facility] Najai Waszak, MD   Past Medical History:  Diagnosis Date  . Diabetes mellitus without complication ([place])   . GERD (gastroesophageal reflux disease)   . Headache(784.0)   . Hyperlipidemia   . Hypertension     Patient Active Problem List   Diagnosis Date Noted  . Left inguinal hernia 03/12/2016  . Uncontrolled diabetes mellitus ([place]) 06/22/2015  . 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 GERD 03/28/2009  . Hyperlipidemia 02/25/2009  . DEPRESSION 02/25/2009  . Essential hypertension 02/25/2009  . BPH (benign prostatic hyperplasia) 02/25/2009    Past Surgical History:  Procedure Laterality Date  . HERNIA REPAIR     umbilical child  . LIPOMA EXCISION Right 11/29/2013   Procedure: EXCISION LIPOMA RIGHT AXILLA;  Surgeon: Autumn Messing III, MD;  Location: [facility];  Service: General;  Laterality: Right;       Home Medications    Prior to Admission medications   Medication Sig Start Date End Date Taking? Authorizing Provider  alfuzosin (UROXATRAL) 10 MG 24 hr tablet Take 10 mg by mouth daily with breakfast.    [provider]  amLODipine (NORVASC) 5 MG tablet Take 1 tablet (5 mg total) by mouth daily. 03/29/17   [facility] Dezmon Conover, MD  aspirin EC 81 MG tablet Take 1 tablet (81 mg total) by mouth daily. 03/27/17   [facility] Amaia Lavallie, MD  Blood Gluc Meter  Disp-Strips (BLOOD GLUCOSE METER DISPOSABLE) DEVI Please given strips of type to work w/ pt's meter. Needs to check tid due to medication change, hyperglycemia 06/12/16   [facility] Taurean Ju, MD  doxycycline (VIBRAMYCIN) 100 MG capsule Take 1 capsule (100 mg total) by mouth 2 (two) times daily. 04/12/17   Rolland Porter, MD  Dulaglutide (TRULICITY) 1.5 IR/6.7EL SOPN Inject 1 application into the skin once a week. 03/29/17   [facility] Leita Lindbloom, MD  fenofibrate (TRICOR) 145 MG tablet TAKE ONE TABLET BY MOUTH ONCE DAILY 09/14/16   [place], FNP  metFORMIN (GLUCOPHAGE) 1000 MG tablet TAKE 1 TAB po bid 03/29/17   [facility] Sani Madariaga, MD  pantoprazole (PROTONIX) 40 MG tablet Take 1 tablet (40 mg total) by mouth daily. 30 minutes before a meal 03/27/17   [facility] Kellsie Grindle, MD  ranitidine (ZANTAC) 150 MG tablet Take 1 tablet (150 mg total) by mouth 2 (two) times daily as needed for heartburn. 03/27/17   [facility] Whittley Carandang, MD    Family History Family History  Problem Relation Age of Onset  . Cancer Father        lung  . Diabetes Father   . Hypertension Father   . Hyperlipidemia Mother   . Hypertension Mother     Social History Social History   Tobacco Use  . Smoking status: Never  Smoker  . Smokeless tobacco: Never Used  Substance Use Topics  . Alcohol use: No    Alcohol/week: 0.0 oz  . Drug use: No     Allergies   Losartan and Ace inhibitors   Review of Systems Review of Systems  All other systems reviewed and are negative.    Physical Exam Updated Vital Signs BP (!) 147/91   Pulse 98   Temp 98.6 F (37 C) (Oral)   Resp 16   Ht 5\' 7"  (1.702 m)   Wt 106.1 kg (234 lb)   SpO2 94%   BMI 36.65 kg/m   Physical Exam  Constitutional: He is oriented to person, place, and time. He appears well-developed and well-nourished. No distress.  HENT:  Head: Normocephalic and atraumatic.  Right Ear: External ear normal.  Left Ear: External ear normal.  Nose: Nose normal.  Eyes: Conjunctivae and EOM are normal.  Neck:  Normal range of motion.  Cardiovascular: Normal rate.  Pulmonary/Chest: Effort normal. No respiratory distress.  Genitourinary: Penis normal.  Genitourinary Comments: Patient does have some clear/purulent drainage in his urethra.  He does not appear to have a infection of the actual shaft or penile head.  Musculoskeletal: Normal range of motion.  Neurological: He is alert and oriented to person, place, and time. No cranial nerve deficit.  Skin: Skin is warm and dry. No rash noted.  Psychiatric: He has a normal mood and affect. His behavior is normal. Thought content normal.  Nursing note and vitals reviewed.    ED Treatments / Results  Labs (all labs ordered are listed, but only abnormal results are displayed)  Results for orders placed or performed during the hospital encounter of 04/12/17  CBG monitoring, ED  Result Value Ref Range   Glucose-Capillary 130 (H) 65 - 99 mg/dL   Laboratory interpretation all normal except mild nonfasting hyperglycemia  EKG  EKG Interpretation None       Radiology No results found.  Procedures Procedures (including critical care time)  Medications Ordered in ED Medications  cefTRIAXone (ROCEPHIN) injection 250 mg (not administered)  sterile water (preservative free) injection (not administered)     Initial Impression / Assessment and Plan / ED Course  I have reviewed the triage vital signs and the nursing notes.  Pertinent labs & imaging results that were available during my care of the patient were reviewed by me and considered in my medical decision making (see chart for details).     After his exam we discussed treatment.  Patient was given Rocephin IM.I am also go start him on doxycycline.  He elected to be tested for syphilis and HIV.  He was advised he would be called if his test results are positive.  Final Clinical Impressions(s) / ED Diagnoses   Final diagnoses:  Urethritis  STD (sexually transmitted disease)    ED  Discharge Orders        Ordered    doxycycline (VIBRAMYCIN) 100 MG capsule  2 times daily     04/12/17 0132      Plan discharge  Rolland Porter, MD, Barbette Or, MD 04/12/17 [phone]

## 2017-04-12 NOTE — Discharge Instructions (Signed)
Take the antibiotics until gone. You will be contacted if your tests are positive. You need to have your sexual partner go to the health department or her doctor to get treated. USE [employer]DOMS!!!!

## 2017-04-13 LAB — GC/CHLAMYDIA PROBE AMP (~~LOC~~) NOT AT ARMC
CHLAMYDIA, DNA PROBE: NEGATIVE
NEISSERIA GONORRHEA: POSITIVE — AB

## 2017-04-13 LAB — RPR: RPR Ser Ql: NONREACTIVE

## 2017-04-13 LAB — HIV ANTIBODY (ROUTINE TESTING W REFLEX): HIV SCREEN 4TH GENERATION: NONREACTIVE

## 2017-04-21 ENCOUNTER — Encounter: Payer: Self-pay | Admitting: Family Medicine

## 2017-04-21 DIAGNOSIS — E669 Obesity, unspecified: Secondary | ICD-10-CM | POA: Insufficient documentation

## 2017-04-21 DIAGNOSIS — Z6836 Body mass index (BMI) 36.0-36.9, adult: Secondary | ICD-10-CM | POA: Insufficient documentation

## 2017-05-20 ENCOUNTER — Ambulatory Visit: Payer: BC Managed Care – PPO | Admitting: Nurse Practitioner

## 2017-06-21 ENCOUNTER — Encounter: Payer: Self-pay | Admitting: Physician Assistant

## 2017-06-24 ENCOUNTER — Ambulatory Visit: Payer: BC Managed Care – PPO | Admitting: Family Medicine

## 2017-06-24 ENCOUNTER — Other Ambulatory Visit: Payer: Self-pay

## 2017-06-24 ENCOUNTER — Encounter: Payer: Self-pay | Admitting: Family Medicine

## 2017-06-24 VITALS — BP 112/74 | HR 86 | Temp 98.0°F | Resp 18 | Ht 67.0 in | Wt 234.2 lb

## 2017-06-24 DIAGNOSIS — E1122 Type 2 diabetes mellitus with diabetic chronic kidney disease: Secondary | ICD-10-CM

## 2017-06-24 DIAGNOSIS — N183 Chronic kidney disease, stage 3 (moderate): Secondary | ICD-10-CM | POA: Diagnosis not present

## 2017-06-24 DIAGNOSIS — Z5181 Encounter for therapeutic drug level monitoring: Secondary | ICD-10-CM

## 2017-06-24 DIAGNOSIS — K219 Gastro-esophageal reflux disease without esophagitis: Secondary | ICD-10-CM

## 2017-06-24 DIAGNOSIS — I10 Essential (primary) hypertension: Secondary | ICD-10-CM | POA: Diagnosis not present

## 2017-06-24 DIAGNOSIS — N401 Enlarged prostate with lower urinary tract symptoms: Secondary | ICD-10-CM | POA: Diagnosis not present

## 2017-06-24 DIAGNOSIS — E781 Pure hyperglyceridemia: Secondary | ICD-10-CM | POA: Diagnosis not present

## 2017-06-24 DIAGNOSIS — E669 Obesity, unspecified: Secondary | ICD-10-CM

## 2017-06-24 LAB — COMPREHENSIVE METABOLIC PANEL
ALBUMIN: 4.6 g/dL (ref 3.5–5.5)
ALT: 20 IU/L (ref 0–44)
AST: 18 IU/L (ref 0–40)
Albumin/Globulin Ratio: 2.1 (ref 1.2–2.2)
Alkaline Phosphatase: 65 IU/L (ref 39–117)
BUN / CREAT RATIO: 8 — AB (ref 9–20)
BUN: 12 mg/dL (ref 6–24)
Bilirubin Total: 0.3 mg/dL (ref 0.0–1.2)
CALCIUM: 9.4 mg/dL (ref 8.7–10.2)
CO2: 21 mmol/L (ref 20–29)
CREATININE: 1.57 mg/dL — AB (ref 0.76–1.27)
Chloride: 102 mmol/L (ref 96–106)
GFR, EST AFRICAN AMERICAN: 59 mL/min/{1.73_m2} — AB (ref >59)
GFR, EST NON AFRICAN AMERICAN: 51 mL/min/{1.73_m2} — AB (ref >59)
Globulin, Total: 2.2 g/dL (ref 1.5–4.5)
Glucose: 118 mg/dL — ABNORMAL HIGH (ref 65–99)
Potassium: 4 mmol/L (ref 3.5–5.2)
Sodium: 140 mmol/L (ref 134–144)
TOTAL PROTEIN: 6.8 g/dL (ref 6.0–8.5)

## 2017-06-24 LAB — POCT GLYCOSYLATED HEMOGLOBIN (HGB A1C): Hemoglobin A1C: 6.1

## 2017-06-24 MED ORDER — ATORVASTATIN CALCIUM 40 MG PO TABS: 40.0000 mg | ORAL_TABLET | Freq: Every day | ORAL | 1 refills | Status: DC

## 2017-06-24 MED ORDER — DULAGLUTIDE 1.5 MG/0.5ML ~~LOC~~ SOAJ: 1.0000 "application " | SUBCUTANEOUS | 1 refills | Status: DC

## 2017-06-24 MED ORDER — AMLODIPINE BESYLATE 5 MG PO TABS: 5.0000 mg | ORAL_TABLET | Freq: Every day | ORAL | 3 refills | Status: DC

## 2017-06-24 MED ORDER — FENOFIBRATE 145 MG PO TABS: 145.0000 mg | ORAL_TABLET | Freq: Every day | ORAL | 1 refills | Status: DC

## 2017-06-24 MED ORDER — METFORMIN HCL 1000 MG PO TABS: ORAL_TABLET | ORAL | 1 refills | Status: DC

## 2017-06-24 MED ORDER — OMEPRAZOLE 20 MG PO CPDR
20.0000 mg | DELAYED_RELEASE_CAPSULE | Freq: Every day | ORAL | 1 refills | Status: DC
Start: 2017-06-24 — End: 2017-12-25

## 2017-06-24 NOTE — Progress Notes (Signed)
Subjective:  By signing my name below, I, Austin Valencia, attest that this documentation has been prepared under the direction and in the presence of [name], MD Electronically Signed: [name], ED Scribe 06/24/2017 at 8:55 AM.   Patient ID: Austin Valencia., male    DOB: 06/26/1965, 52 y.o.   MRN: [id number]  Chief Complaint  Patient presents with  . Diabetes    Metformin 1000 MG, pt states he thinks it working okay. Pt reports no side effects.  . Follow-up   HPI Austin Valencia. is a 52 y.o. male who presents to Primary Care at [facility] for f/u on DM. Pt is fasting at this visit.    DMII: Diagnosed 05/2013.   Lab Results  Component Value Date   HGBA1C 6.1 06/24/2017   HGBA1C 6.2 (H) 03/27/2017   HGBA1C WILL FOLLOW 03/27/2017   HGBA1C 6.9 12/09/2016   HGBA1C 13.6 09/02/2016    CBGs: fasting a.m.120-125. No hypoglycemic episodes.  Denies lows, nausea, diarrhea.  Meter type:  Diet:  Exercising:  DM Med Regimen: Metformin 1g bid and Trulicity 1.7EY qwk Prior changes:   81/4481 Increase Trulicity to 8.5UD qwk  07/2016 Add in metformin 1g bid  14/9702 Start Trulicity 6.37CH qwk  88/5027 Glucotrol XL 10 qam, was off of xigdo  06/2015 Start Xigduo  XR 5-1059m qam as was off of prior meds  09/2014 Add in Januvia 1045mqd to Amaryl 43m61mam  08/2014 1 mo of metformin 500m72md, then stopped  04/2014 Amaryl 43mg 75mFR: 58-78  Baseline Cr: 1.26-1.7, usu ~1.5  Last checked 06/24/2017. Microalb: Normal 03/27/2017. Not on acei/arb due to angioedema on acei and losartan Lipids:  LDL 31,  non-HDL 93.  Last levels done 03/27/2017  - were improving from prior. Not on statin - was tried on simvastatin 10 several times prior but pt always stopped taking - he does not remember why. Taking asa 81 qd.  [facility] Tanne[facility]ira Sarkst exam 09/22/16 Feet: Monofilament exam done 07/2016. Denies any no problems.  Not seen by podiatry  prior.  Immunizations:  Influenza: 11/2016  Pneumovax-23: 01/2015   Hyperlipidemia Pt states that he often misses doses of Tricor.  Pt does not know why he is not on a statin and is willing to try one. It appears he was rx'd simvastatin 20 several times prior and always stopped taking it on his own.  Last lipids 3 mos ago and LDL 31 with non-HDL 93 but trig were 310 on fenofibrate 145 but was non-compliant.  GERD Still having some heartburn if he doesn't take OTC omeprazole daily in the morning or if he eats greasy, fried or spicy foods throughout the day.  Has been seen by cardiologist Dr. HarwaTerrence Dupontr. [facility] Dr. [name] Selleros ago but he reports he forgot that he already had a cardiology - Dr. HarwaTerrence Dupontplans to f/u w/ him in the future.   Current Outpatient Medications on File Prior to Visit  Medication Sig Dispense Refill  . alfuzosin (UROXATRAL) 10 MG 24 hr tablet Take 10 mg by mouth daily with breakfast.    . amLODipine (NORVASC) 5 MG tablet Take 1 tablet (5 mg total) by mouth daily. 90 tablet 1  . aspirin EC 81 MG tablet Take 1 tablet (81 mg total) by mouth daily.    . Blood Gluc Meter Disp-Strips (BLOOD GLUCOSE METER DISPOSABLE) DEVI Please  given strips of type to work w/ pt's meter. Needs to check tid due to medication change, hyperglycemia 100 each 11  . Dulaglutide (TRULICITY) 1.5 ZO/1.0RU SOPN Inject 1 application into the skin once a week. 4 pen 2  . fenofibrate (TRICOR) 145 MG tablet TAKE ONE TABLET BY MOUTH ONCE DAILY 30 tablet 11  . metFORMIN (GLUCOPHAGE) 1000 MG tablet TAKE 1 TAB po bid 180 tablet 1  . pantoprazole (PROTONIX) 40 MG tablet Take 1 tablet (40 mg total) by mouth daily. 30 minutes before a meal 30 tablet 3  . ranitidine (ZANTAC) 150 MG tablet Take 1 tablet (150 mg total) by mouth 2 (two) times daily as needed for heartburn. 60 tablet 3  . doxycycline (VIBRAMYCIN) 100 MG capsule Take 1 capsule (100 mg total) by  mouth 2 (two) times daily. (Patient not taking: Reported on 06/24/2017) 20 capsule 0   No current facility-administered medications on file prior to visit.    Past Medical History:  Diagnosis Date  . Diabetes mellitus without complication ([place])   . GERD (gastroesophageal reflux disease)   . Headache(784.0)   . Hyperlipidemia   . Hypertension    Past Surgical History:  Procedure Laterality Date  . HERNIA REPAIR     umbilical child  . LIPOMA EXCISION Right 11/29/2013   Procedure: EXCISION LIPOMA RIGHT AXILLA;  Surgeon: Autumn Messing III, MD;  Location: [place];  Service: General;  Laterality: Right;    Allergies  Allergen Reactions  . 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 Ace Inhibitors     See 05/23/14 angioedema with Losartan   Family History  Problem Relation Age of Onset  . Cancer Father        lung  . Diabetes Father   . Hypertension Father   . Hyperlipidemia Mother   . Hypertension Mother    Social History   Socioeconomic History  . Marital status: Single    Spouse name: Not on file  . Number of children: 2  . Years of education: 33  . Highest education level: Not on file  Occupational History  . Occupation: Custodian  Social Needs  . Financial resource strain: Not on file  . Food insecurity:    Worry: Not on file    Inability: Not on file  . Transportation needs:    Medical: Not on file    Non-medical: Not on file  Tobacco Use  . Smoking status: Never Smoker  . Smokeless tobacco: Never Used  Substance and Sexual Activity  . Alcohol use: No    Alcohol/week: 0.0 oz  . Drug use: No  . Sexual activity: Yes  Lifestyle  . Physical activity:    Days per week: Not on file    Minutes per session: Not on file  . Stress: Not on file  Relationships  . Social connections:    Talks on phone: Not on file    Gets together: Not on file    Attends religious service: Not on file    Active member of club or organization: Not on file    Attends  meetings of clubs or organizations: Not on file    Relationship status: Not on file  Other Topics Concern  . Not on file  Social History Narrative   Born in [place], [place] and raised [place], [place]. Currently resides in a house with his mother. No pets. Fun: Go to sporting events, concerts   Denies religious beliefs that would effect health care.  Depression screen [facility] 2/9 06/24/2017 03/27/2017 12/23/2016 10/14/2016 09/02/2016  Decreased Interest 0 0 0 0 0  Down, Depressed, Hopeless 0 0 0 0 0  PHQ - 2 Score 0 0 0 0 0     Review of Systems  Constitutional: Negative for activity change, appetite change, chills, fatigue, fever and unexpected weight change.  HENT: Negative for congestion, postnasal drip, rhinorrhea, sinus pressure, sinus pain, sneezing and sore throat.   Cardiovascular: Negative for palpitations and leg swelling.  Gastrointestinal: Negative for anal bleeding, blood in stool, diarrhea, nausea and vomiting.  Neurological: Negative for weakness.  Psychiatric/Behavioral: Negative for dysphoric mood.      Objective:   Physical Exam  Constitutional: He is oriented to person, place, and time. He appears well-developed and well-nourished. No distress.  HENT:  Head: Normocephalic and atraumatic.  Eyes: Conjunctivae and EOM are normal.  Neck: Neck supple. No tracheal deviation present.  Cardiovascular: Normal rate, regular rhythm, S1 normal, S2 normal and normal heart sounds.  Pulmonary/Chest: Effort normal and breath sounds normal. No respiratory distress.  Musculoskeletal: Normal range of motion.  Neurological: He is alert and oriented to person, place, and time.  Skin: Skin is warm and dry.  Psychiatric: He has a normal mood and affect. His behavior is normal.  Nursing note and vitals reviewed.  BP 112/74 (BP Location: Left Arm, Patient Position: Sitting, Cuff Size: Large)   Pulse 86   Temp 98 F (36.7 C) (Oral)   Resp 18   Ht 5' 7"  (1.702 m)   Wt 234 lb 3.2 oz (106.2 kg)    SpO2 95%   BMI 36.68 kg/m     Results for orders placed or performed in visit on 06/24/17  POCT glycosylated hemoglobin (Hb A1C)  Result Value Ref Range   Hemoglobin A1C 6.1    Assessment & Plan:   1. Type 2 diabetes mellitus with stage 3 chronic kidney disease, without long-term current use of insulin ([place]) - needs foot exam at next OV DM doing GREAT! Cont Trulicity 1.5 and metformin 1g bid  2. Pure hyperglyceridemia - uncontrolled trig on tricor 145 due to poor compliance. Add in atorvastatin 40 for DM cardiac protection- encouraged pt to continue w/ med even if he has poor compliance as will still contribute plaque stabillizing and anti-inflammatory effects even if just getting in sev x/wk. However, encouraged pt to get med box and aim for great compliance as then when we recheck lipid panel in 6 mos, trig may look good enough that he would try stopping the tricor to try to reduce his pill burden by 1 and then see if he can still keep his trig looking good on the atorvastatin alone 6 mos after that.  3. Benign prostatic hyperplasia with lower urinary tract symptoms, symptom details unspecified - last OV note we have from [facility] was 09/2015 by NP Jiles Crocker where pt had failed doxazosin 4 but sig sx improvement on alfuzosin 10 so cont w/ plan for them to recheck elev PSA in 6 mos - not sure if pt is still seeing - need to discuss at f/u  4. Gastroesophageal reflux disease without esophagitis - controlled on otc omeprazole 20 qd but breakthrough sxs if stops, worse by certain foods.  5. Essential hypertension - excellent control on amlodipine 5  6. Obesity (BMI 30-39.9)   7. Medication monitoring encounter    Recheck in 6 mos w/ fasting labs  Orders Placed This Encounter  Procedures  . Comprehensive metabolic panel  .  Vitamin B12  . TSH  . POCT glycosylated hemoglobin (Hb A1C)    Meds ordered this encounter  Medications  . omeprazole (PRILOSEC) 20 MG capsule    Sig:  Take 1 capsule (20 mg total) by mouth daily.    Dispense:  90 capsule    Refill:  1  . fenofibrate (TRICOR) 145 MG tablet    Sig: Take 1 tablet (145 mg total) by mouth daily.    Dispense:  90 tablet    Refill:  1    Please consider 90 day supplies to promote better adherence  . metFORMIN (GLUCOPHAGE) 1000 MG tablet    Sig: TAKE 1 TAB po bid    Dispense:  180 tablet    Refill:  1    Please consider 90 day supplies to promote better adherence  . Dulaglutide (TRULICITY) 1.5 DE/0.8XK SOPN    Sig: Inject 1 application into the skin once a week.    Dispense:  12 pen    Refill:  1  . amLODipine (NORVASC) 5 MG tablet    Sig: Take 1 tablet (5 mg total) by mouth daily.    Dispense:  90 tablet    Refill:  3  . atorvastatin (LIPITOR) 40 MG tablet    Sig: Take 1 tablet (40 mg total) by mouth daily.    Dispense:  90 tablet    Refill:  1    I personally performed the services described in this documentation, which was scribed in my presence. The recorded information has been reviewed and considered, and addended by me as needed.   [name], M.D.  Primary Care at [facility] [address] [place], [place] [address] [phone] phone [phone] fax  06/27/17 7:58 AM

## 2017-06-24 NOTE — Patient Instructions (Addendum)
   IF you received an x-ray today, you will receive an invoice from [facility]. Please contact [facility] at [phone] with questions or concerns regarding your invoice.   IF you received labwork today, you will receive an invoice from [facility]. Please contact [facility] at [phone] with questions or concerns regarding your invoice.   Our billing staff will not be able to assist you with questions regarding bills from these companies.  You will be contacted with the lab results as soon as they are available. The fastest way to get your results is to activate your My Chart account. Instructions are located on the last page of this paperwork. If you have not heard from us regarding the results in 2 weeks, please contact this office.      Diabetes Mellitus and Standards of Medical Care Managing diabetes (diabetes mellitus) can be complicated. Your diabetes treatment may be managed by a team of health care providers, including:  A diet and nutrition specialist (registered dietitian).  A nurse.  A certified diabetes educator (CDE).  A diabetes specialist (endocrinologist).  An eye doctor.  A primary care provider.  A dentist.  Your health care providers follow a schedule in order to help you get the best quality of care. The following schedule is a general guideline for your diabetes management plan. Your health care providers may also give you more specific instructions. HbA1c ( hemoglobin A1c) test This test provides information about blood sugar (glucose) control over the previous 2-3 months. It is used to check whether your diabetes management plan needs to be adjusted.  If you are meeting your treatment goals, this test is done at least 2 times a year.  If you are not meeting treatment goals or if your treatment goals have changed, this test is done 4 times a year.  Blood pressure test  This test is done at every routine medical visit. For most  people, the goal is less than 130/80. Ask your health care provider what your goal blood pressure should be. Dental and eye exams  Visit your dentist two times a year.  If you have type 1 diabetes, get an eye exam 3-5 years after you are diagnosed, and then once a year after your first exam. ? If you were diagnosed with type 1 diabetes as a child, get an eye exam when you are age 10 or older and have had diabetes for 3-5 years. After the first exam, you should get an eye exam once a year.  If you have type 2 diabetes, have an eye exam as soon as you are diagnosed, and then once a year after your first exam. Foot care exam  Visual foot exams are done at every routine medical visit. The exams check for cuts, bruises, redness, blisters, sores, or other problems with the feet.  A complete foot exam is done by your health care provider once a year. This exam includes an inspection of the structure and skin of your feet, and a check of the pulses and sensation in your feet. ? Type 1 diabetes: Get your first exam 3-5 years after diagnosis. ? Type 2 diabetes: Get your first exam as soon as you are diagnosed.  Check your feet every day for cuts, bruises, redness, blisters, or sores. If you have any of these or other problems that are not healing, contact your health care provider. Kidney function test ( urine microalbumin)  This test is done once a year. ? Type 1 diabetes:   your first test 5 years after diagnosis. ? Type 2 diabetes: Get your first test as soon as you are diagnosed.  If you have chronic kidney disease (CKD), get a serum creatinine and estimated glomerular filtration rate (eGFR) test once a year. Lipid profile (cholesterol, HDL, LDL, triglycerides)  This test should be done when you are diagnosed with diabetes, and every 5 years after the first test. If you are on medicines to lower your cholesterol, you may need to get this test done every year. ? The goal for LDL is less than  100 mg/dL (5.5 mmol/L). If you are at high risk, the goal is less than 70 mg/dL (3.9 mmol/L). ? The goal for HDL is 40 mg/dL (2.2 mmol/L) for men and 50 mg/dL(2.8 mmol/L) for women. An HDL cholesterol of 60 mg/dL (3.3 mmol/L) or higher gives some protection against heart disease. ? The goal for triglycerides is less than 150 mg/dL (8.3 mmol/L). Immunizations  The yearly flu (influenza) vaccine is recommended for everyone 6 months or older who has diabetes.  The pneumonia (pneumococcal) vaccine is recommended for everyone 2 years or older who has diabetes. If you are 45 or older, you may get the pneumonia vaccine as a series of two separate shots.  The hepatitis B vaccine is recommended for adults shortly after they have been diagnosed with diabetes.  The Tdap (tetanus, diphtheria, and pertussis) vaccine should be given: ? According to normal childhood vaccination schedules, for children. ? Every 10 years, for adults who have diabetes.  The shingles vaccine is recommended for people who have had chicken pox and are 50 years or older. Mental and emotional health  Screening for symptoms of eating disorders, anxiety, and depression is recommended at the time of diagnosis and afterward as needed. If your screening shows that you have symptoms (you have a positive screening result), you may need further evaluation and be referred to a mental health care provider. Diabetes self-management education  Education about how to manage your diabetes is recommended at diagnosis and ongoing as needed. Treatment plan  Your treatment plan will be reviewed at every medical visit. Summary  Managing diabetes (diabetes mellitus) can be complicated. Your diabetes treatment may be managed by a team of health care providers.  Your health care providers follow a schedule in order to help you get the best quality of care.  Standards of care including having regular physical exams, blood tests, blood pressure  monitoring, immunizations, screening tests, and education about how to manage your diabetes.  Your health care providers may also give you more specific instructions based on your individual health. This information is not intended to replace advice given to you by your health care provider. Make sure you discuss any questions you have with your health care provider. Document Released: 01/04/2009 Document Revised: 12/06/2015 Document Reviewed: 12/06/2015 Elsevier Interactive Patient Education  2018 [employer] Choices to Lower Your Triglycerides Triglycerides are a type of fat in your blood. High levels of triglycerides can increase the risk of heart disease and stroke. If your triglyceride levels are high, the foods you eat and your eating habits are very important. Choosing the right foods can help lower your triglycerides. What general guidelines do I need to follow?  Lose weight if you are overweight.  Limit or avoid alcohol.  Fill one half of your plate with vegetables and green salads.  Limit fruit to two servings a day. Choose fruit instead of juice.  Make one fourth of your  plate whole grains. Look for the word "whole" as the first word in the ingredient list.  Fill one fourth of your plate with lean protein foods.  Enjoy fatty fish (such as salmon, mackerel, sardines, and tuna) three times a week.  Choose healthy fats.  Limit foods high in starch and sugar.  Eat more home-cooked food and less restaurant, buffet, and fast food.  Limit fried foods.  Cook foods using methods other than frying.  Limit saturated fats.  Check ingredient lists to avoid foods with partially hydrogenated oils (trans fats) in them. What foods can I eat? Grains Whole grains, such as whole wheat or whole grain breads, crackers, cereals, and pasta. Unsweetened oatmeal, bulgur, barley, quinoa, or brown rice. Corn or whole wheat flour tortillas. Vegetables Fresh or frozen vegetables (raw,  steamed, roasted, or grilled). Green salads. Fruits All fresh, canned (in natural juice), or frozen fruits. Meat and Other Protein Products Ground beef (85% or leaner), grass-fed beef, or beef trimmed of fat. Skinless chicken or [place]. Ground chicken or [place]. Pork trimmed of fat. All fish and seafood. Eggs. Dried beans, peas, or lentils. Unsalted nuts or seeds. Unsalted canned or dry beans. Dairy Low-fat dairy products, such as skim or 1% milk, 2% or reduced-fat cheeses, low-fat ricotta or cottage cheese, or plain low-fat yogurt. Fats and Oils Tub margarines without trans fats. Light or reduced-fat mayonnaise and salad dressings. Avocado. Safflower, olive, or canola oils. Natural peanut or almond butter. The items listed above may not be a complete list of recommended foods or beverages. Contact your dietitian for more options. What foods are not recommended? Grains White bread. White pasta. White rice. Cornbread. Bagels, pastries, and croissants. Crackers that contain trans fat. Vegetables White potatoes. Corn. Creamed or fried vegetables. Vegetables in a cheese sauce. Fruits Dried fruits. Canned fruit in light or heavy syrup. Fruit juice. Meat and Other Protein Products Fatty cuts of meat. Ribs, chicken wings, bacon, sausage, bologna, salami, chitterlings, fatback, hot dogs, bratwurst, and packaged luncheon meats. Dairy Whole or 2% milk, cream, half-and-half, and cream cheese. Whole-fat or sweetened yogurt. Full-fat cheeses. Nondairy creamers and whipped toppings. Processed cheese, cheese spreads, or cheese curds. Sweets and Desserts Corn syrup, sugars, honey, and molasses. Candy. Jam and jelly. Syrup. Sweetened cereals. Cookies, pies, cakes, donuts, muffins, and ice cream. Fats and Oils Butter, stick margarine, lard, shortening, ghee, or bacon fat. Coconut, palm kernel, or palm oils. Beverages Alcohol. Sweetened drinks (such as sodas, lemonade, and fruit drinks or punches). The items  listed above may not be a complete list of foods and beverages to avoid. Contact your dietitian for more information. This information is not intended to replace advice given to you by your health care provider. Make sure you discuss any questions you have with your health care provider. Document Released: 12/26/2003 Document Revised: 08/15/2015 Document Reviewed: 01/11/2013 Elsevier Interactive Patient Education  2017 [employer].

## 2017-06-25 LAB — TSH: TSH: 0.626 u[IU]/mL (ref 0.450–4.500)

## 2017-06-25 LAB — VITAMIN B12: Vitamin B-12: 219 pg/mL — ABNORMAL LOW (ref 232–1245)

## 2017-06-27 ENCOUNTER — Encounter: Payer: Self-pay | Admitting: Family Medicine

## 2017-07-01 ENCOUNTER — Other Ambulatory Visit: Payer: Self-pay | Admitting: Family Medicine

## 2017-07-01 NOTE — Telephone Encounter (Signed)
Request for refill of Alfuzosin (uroxatral) 10mg    24hr tablet. Medication not previously filled by historical provider.    LOV:06/24/17  PCP: Dr Manuela Schwartz on [address]

## 2017-07-01 NOTE — Telephone Encounter (Signed)
Copied from [place] [id number]. Topic: Quick Communication - Rx Refill/Question >> Jul 01, 2017  6:26 PM Oliver Pila B wrote: Medication: alfuzosin (UROXATRAL) 10 MG 24 hr tablet [[id number]]  Has the patient contacted their pharmacy? Yes.   (Agent: If no, request that the patient contact the pharmacy for the refill.) Preferred Pharmacy (with phone number or street name): [facility] Agent: Please be advised that RX refills may take up to 3 business days. We ask that you follow-up with your pharmacy.

## 2017-07-02 MED ORDER — ALFUZOSIN HCL ER 10 MG PO TB24: 10.0000 mg | ORAL_TABLET | Freq: Every day | ORAL | 3 refills | Status: DC

## 2017-07-02 NOTE — Telephone Encounter (Signed)
Provider, please prescribe medication if appropriate.

## 2017-10-16 ENCOUNTER — Ambulatory Visit: Payer: BC Managed Care – PPO | Admitting: Family Medicine

## 2017-10-16 ENCOUNTER — Other Ambulatory Visit: Payer: Self-pay

## 2017-10-16 ENCOUNTER — Encounter: Payer: Self-pay | Admitting: Family Medicine

## 2017-10-16 VITALS — BP 110/78 | HR 99 | Temp 98.1°F | Resp 16 | Ht 67.0 in | Wt 231.0 lb

## 2017-10-16 DIAGNOSIS — N183 Chronic kidney disease, stage 3 (moderate): Secondary | ICD-10-CM

## 2017-10-16 DIAGNOSIS — R972 Elevated prostate specific antigen [PSA]: Secondary | ICD-10-CM

## 2017-10-16 DIAGNOSIS — E1122 Type 2 diabetes mellitus with diabetic chronic kidney disease: Secondary | ICD-10-CM

## 2017-10-16 DIAGNOSIS — R739 Hyperglycemia, unspecified: Secondary | ICD-10-CM

## 2017-10-16 DIAGNOSIS — E785 Hyperlipidemia, unspecified: Secondary | ICD-10-CM

## 2017-10-16 DIAGNOSIS — E1169 Type 2 diabetes mellitus with other specified complication: Secondary | ICD-10-CM | POA: Diagnosis not present

## 2017-10-16 DIAGNOSIS — E538 Deficiency of other specified B group vitamins: Secondary | ICD-10-CM | POA: Diagnosis not present

## 2017-10-16 DIAGNOSIS — N401 Enlarged prostate with lower urinary tract symptoms: Secondary | ICD-10-CM

## 2017-10-16 LAB — POCT GLYCOSYLATED HEMOGLOBIN (HGB A1C): HEMOGLOBIN A1C: 6.2 % — AB (ref 4.0–5.6)

## 2017-10-16 MED ORDER — CYANOCOBALAMIN 1000 MCG/ML IJ SOLN
1000.0000 ug | Freq: Once | INTRAMUSCULAR | Status: AC
  Administered 2017-10-16: 1000 ug via INTRAMUSCULAR

## 2017-10-16 NOTE — Patient Instructions (Addendum)
Start taking Vitamin B12 1057mcg every day in a liquid or a dissolvable tablet     IF you received an x-ray today, you will receive an invoice from [facility]. Please contact [facility] at [phone] with questions or concerns regarding your invoice.   IF you received labwork today, you will receive an invoice from [place]. Please contact [facility] at [phone] with questions or concerns regarding your invoice.   Our billing staff will not be able to assist you with questions regarding bills from these companies.  You will be contacted with the lab results as soon as they are available. The fastest way to get your results is to activate your My Chart account. Instructions are located on the last page of this paperwork. If you have not heard from [place] regarding the results in 2 weeks, please contact this office.     Vitamin B12 Deficiency Vitamin B12 deficiency occurs when the body does not have enough vitamin B12. Vitamin B12 is an important vitamin. The body needs vitamin B12:  To make red blood cells.  To make DNA. This is the genetic material inside cells.  To help the nerves work properly so they can carry messages from the brain to the body.  Vitamin B12 deficiency can cause various health problems, such as a low red blood cell count (anemia) or nerve damage. What are the causes? This condition may be caused by:  Not eating enough foods that contain vitamin B12.  Not having enough stomach acid and digestive fluids to properly absorb vitamin B12 from the food that you eat.  Certain digestive system diseases that make it hard to absorb vitamin B12. These diseases include Crohn disease, chronic pancreatitis, and cystic fibrosis.  Pernicious anemia. This is a condition in which the body does not make enough of a protein (intrinsic factor), resulting in too few red blood cells.  Having a surgery in which part of the stomach or small intestine is  removed.  Taking certain medicines that make it hard for the body to absorb vitamin B12. These medicines include: ? Heartburn medicine (antacids and proton pump inhibitors). ? An antibiotic medicine called neomycin. ? Some medicines that are used to treat diabetes, tuberculosis, gout, or high cholesterol.  What increases the risk? The following factors may make you more likely to develop a B12 deficiency:  Being older than age 66.  Eating a vegetarian or vegan diet, especially while you are pregnant.  Eating a poor diet while you are pregnant.  Taking certain drugs.  Having alcoholism.  What are the signs or symptoms? In some cases, there are no symptoms of this condition. If the condition leads to anemia or nerve damage, various symptoms can occur, such as:  Weakness.  Fatigue.  Loss of appetite.  Weight loss.  Numbness or tingling in your hands and feet.  Redness and burning of the tongue.  Confusion or memory problems.  Depression.  Sensory problems, such as color blindness, ringing in the ears, or loss of taste.  Diarrhea or constipation.  Trouble walking.  If anemia is severe, symptoms can include:  Shortness of breath.  Dizziness.  Rapid heart rate (tachycardia).  How is this diagnosed? This condition may be diagnosed with a blood test to measure the level of vitamin B12 in your blood. You may have other tests to help find the cause of your vitamin B12 deficiency. These tests may include:  A complete blood count (CBC). This is a group of tests that measure  certain characteristics of blood cells.  A blood test to measure intrinsic factor.  An endoscopy. In this procedure, a thin tube with a camera on the end is used to look into your stomach or intestines.  How is this treated? Treatment for this condition depends on the cause. Common treatment options include:  Changing your eating and drinking habits, such as: ? Eating more foods that contain  vitamin B12. ? Drinking less alcohol or no alcohol.  Taking vitamin B12 supplements. Your health care provider will tell you which dosage is best for you.  Getting vitamin B12 injections.  Follow these instructions at home:  Take supplements only as told by your health care provider. Follow the directions carefully.  Get any injections that are prescribed by your health care provider.  Do not miss your appointments.  Eat lots of healthy foods that contain vitamin B12. Ask your health care provider if you should work with a dietitian. Foods that contain vitamin B12 include: ? Meat. ? Meat from birds (poultry). ? Fish. ? Eggs. ? Cereal and dairy products that are fortified. This means that vitamin B12 has been added to the food. Check the label on the package to see if the food is fortified.  Do not abuse alcohol.  Keep all follow-up visits as told by your health care provider. This is important. Contact a health care provider if:  Your symptoms come back. Get help right away if:  You develop shortness of breath.  You have chest pain.  You become dizzy or you lose consciousness. This information is not intended to replace advice given to you by your health care provider. Make sure you discuss any questions you have with your health care provider. Document Released: 06/01/2011 Document Revised: 08/21/2015 Document Reviewed: 07/25/2014 Elsevier Interactive Patient Education  2018 [employer].

## 2017-10-16 NOTE — Progress Notes (Addendum)
By signing my name below, I, Schuyler Bain, attest that this documentation has been prepared under the direction and in the presence of Dr. [name]. Electronically Signed: Baldwin Jamaica, Scribe 10/16/2017 at 8:46 AM.   Subjective:    Patient ID: Austin Roche., male    DOB: [birth year], 52 y.o.   MRN: [id number] Chief Complaint  Patient presents with  . Diabetes    blood sugars at home 137 before eating  . Bloodwork    Triglycerides   HPI Austin [name]. is a 52 y.o. male who presents to Primary Care at [facility] for management and evaluation of his Diabetes mellitus. He notes that he has not eaten anything this morning and reports that in the morning, around 8-9am, prior to breakfast his blood sugars have been around the 130s and as high as 140-150s. He also notes that he eats dinner late regularly, eating a gyro, diet coke, and french fries last night at 11pm. He notes that these kinds of food options are relatively normal for him, but on his "healthy" nights he may eat chicken and a salad with a diet coke. He notes that he drinks 2 cans of diet coke each day. He denies checking his blood sugars throughout the day, and checks his morning blood sugars twice a week. He does associate his morning blood sugars with his diet choices.   The pt notes that he takes Metformin separately from his other medications which he otherwise takes together at night time without complaints of nausea.  The pt also notes that he misses a medication and catches up by doubling the next day.  Is not currently taking a Vitamin B12 replacement, but is open to receiving a B12 injection today. He notes that occasionally he has a slightly more difficult time getting up out of bed   The pt notes that he last saw [facility] two years ago and he didn't follow up after a slightly higher blood PSA level was discovered. He notes that he had to cancel a follow up appointment and forgot to reschedule with urology. He  notes stable urinary frequency and flow, denying changes from his baseline.    Patient Active Problem List   Diagnosis Date Noted  . Obesity (BMI 30-39.9) 04/21/2017  . Left inguinal hernia 03/12/2016  . Type 2 diabetes mellitus with stage 3 chronic kidney disease, without long-term current use of insulin ([place]) 06/22/2015  . 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 GERD 03/28/2009  . Hyperlipidemia 02/25/2009  . DEPRESSION 02/25/2009  . Essential hypertension 02/25/2009  . BPH (benign prostatic hyperplasia) 02/25/2009   Past Medical History:  Diagnosis Date  . Diabetes mellitus without complication ([place]) 49/6759  . GERD (gastroesophageal reflux disease)   . Headache(784.0)   . Hyperlipidemia   . Hypertension    Past Surgical History:  Procedure Laterality Date  . HERNIA REPAIR     umbilical child  . LIPOMA EXCISION Right 11/29/2013   Procedure: EXCISION LIPOMA RIGHT AXILLA;  Surgeon: Autumn Messing III, MD;  Location: [facility];  Service: General;  Laterality: Right;   Allergies  Allergen Reactions  . 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 Ace Inhibitors     See 05/23/14 angioedema with Losartan   Prior to Admission medications   Medication Sig Start Date End Date Taking? Authorizing Provider  alfuzosin (UROXATRAL) 10 MG 24 hr tablet Take 1 tablet (10 mg total) by mouth daily with breakfast. 07/02/17  Shawnee Knapp, MD  amLODipine (NORVASC) 5 MG tablet Take 1 tablet (5 mg total) by mouth daily. 06/24/17   Shawnee Knapp, MD  aspirin EC 81 MG tablet Take 1 tablet (81 mg total) by mouth daily. 03/27/17   Shawnee Knapp, MD  atorvastatin (LIPITOR) 40 MG tablet Take 1 tablet (40 mg total) by mouth daily. 06/24/17   Shawnee Knapp, MD  Blood Gluc Meter Disp-Strips (BLOOD GLUCOSE METER DISPOSABLE) DEVI Please given strips of type to work w/ pt's meter. Needs to check tid due to medication change, hyperglycemia 06/12/16   Shawnee Knapp, MD  Dulaglutide (TRULICITY) 1.5 WS/5.6CL SOPN Inject 1  application into the skin once a week. 06/24/17   Shawnee Knapp, MD  fenofibrate (TRICOR) 145 MG tablet Take 1 tablet (145 mg total) by mouth daily. 06/24/17   Shawnee Knapp, MD  metFORMIN (GLUCOPHAGE) 1000 MG tablet TAKE 1 TAB po bid 06/24/17   Shawnee Knapp, MD  omeprazole (PRILOSEC) 20 MG capsule Take 1 capsule (20 mg total) by mouth daily. 06/24/17   Shawnee Knapp, MD   Social History   Socioeconomic History  . Marital status: Single    Spouse name: Not on file  . Number of children: 2  . Years of education: 66  . Highest education level: Not on file  Occupational History  . Occupation: Custodian  Social Needs  . Financial resource strain: Not on file  . Food insecurity:    Worry: Not on file    Inability: Not on file  . Transportation needs:    Medical: Not on file    Non-medical: Not on file  Tobacco Use  . Smoking status: Never Smoker  . Smokeless tobacco: Never Used  Substance and Sexual Activity  . Alcohol use: No    Alcohol/week: 0.0 oz  . Drug use: No  . Sexual activity: Yes  Lifestyle  . Physical activity:    Days per week: Not on file    Minutes per session: Not on file  . Stress: Not on file  Relationships  . Social connections:    Talks on phone: Not on file    Gets together: Not on file    Attends religious service: Not on file    Active member of club or organization: Not on file    Attends meetings of clubs or organizations: Not on file    Relationship status: Not on file  . Intimate partner violence:    Fear of current or ex partner: Not on file    Emotionally abused: Not on file    Physically abused: Not on file    Forced sexual activity: Not on file  Other Topics Concern  . Not on file  Social History Narrative   Born in [place], [place] and raised [place], [place]. Currently resides in a house with his mother. No pets. Fun: Go to sporting events, concerts   Denies religious beliefs that would effect health care.       Review of Systems  Constitutional:  Negative for activity change, appetite change, chills, diaphoresis, fatigue and fever.  HENT: Negative for facial swelling, hearing loss, postnasal drip, rhinorrhea, sneezing and voice change.   Respiratory: Negative for cough, choking, chest tightness and wheezing.   Cardiovascular: Negative for chest pain.  Gastrointestinal: Negative for abdominal distention, abdominal pain, nausea and vomiting.  Genitourinary: Negative for difficulty urinating.  Skin: Negative for color change, pallor, rash and wound.  Neurological: Negative for dizziness,  tremors, syncope, facial asymmetry and speech difficulty.  Psychiatric/Behavioral: Negative for agitation, behavioral problems, confusion and dysphoric mood. The patient is not nervous/anxious and is not hyperactive.        Objective:   Physical Exam  Constitutional: He is oriented to person, place, and time. He appears well-developed and well-nourished. No distress.  HENT:  Head: Normocephalic and atraumatic.  Eyes: Pupils are equal, round, and reactive to light. Conjunctivae and EOM are normal.  Neck: Normal range of motion. No JVD present. No thyromegaly present.  Cardiovascular: Normal rate and regular rhythm.  Pulmonary/Chest: Effort normal. No respiratory distress. He has no wheezes. He exhibits no tenderness.  Abdominal: Soft. He exhibits no distension. There is no tenderness.  Musculoskeletal: Normal range of motion.  Lymphadenopathy:    He has no cervical adenopathy.  Neurological: He is alert and oriented to person, place, and time.  Skin: Skin is warm and dry. No rash noted. He is not diaphoretic. No erythema.  Psychiatric: He has a normal mood and affect. His behavior is normal. Judgment and thought content normal.    Vitals:   10/16/17 0840  Pulse: 99  Resp: 16  Temp: 98.1 F (36.7 C)  TempSrc: Oral  SpO2: 96%  Weight: 231 lb (104.8 kg)  Height: 5\' 7"  (1.702 m)   Diabetic Foot Exam - Simple   Simple Foot Form Diabetic Foot  exam was performed with the following findings:  Yes 10/16/2017  8:47 AM  Visual Inspection No deformities, no ulcerations, no other skin breakdown bilaterally:  Yes Sensation Testing Intact to touch and monofilament testing bilaterally:  Yes Pulse Check Posterior Tibialis and Dorsalis pulse intact bilaterally:  Yes Comments      Results for orders placed or performed in visit on 10/16/17  Lipid panel  Result Value Ref Range   Cholesterol, Total 76 (L) 100 - 199 mg/dL   Triglycerides 172 (H) 0 - 149 mg/dL   HDL 26 (L) >39 mg/dL   VLDL Cholesterol Cal 34 5 - 40 mg/dL   LDL Calculated 16 0 - 99 mg/dL   Chol/HDL Ratio 2.9 0.0 - 5.0 ratio  Comprehensive metabolic panel  Result Value Ref Range   Glucose 115 (H) 65 - 99 mg/dL   BUN 11 6 - 24 mg/dL   Creatinine, Ser 1.50 (H) 0.76 - 1.27 mg/dL   GFR calc non Af Amer 54 (L) >59 mL/min/1.73   GFR calc Af Amer 62 >59 mL/min/1.73   BUN/Creatinine Ratio 7 (L) 9 - 20   Sodium 141 134 - 144 mmol/L   Potassium 3.9 3.5 - 5.2 mmol/L   Chloride 103 96 - 106 mmol/L   CO2 23 20 - 29 mmol/L   Calcium 9.2 8.7 - 10.2 mg/dL   Total Protein 6.8 6.0 - 8.5 g/dL   Albumin 4.5 3.5 - 5.5 g/dL   Globulin, Total 2.3 1.5 - 4.5 g/dL   Albumin/Globulin Ratio 2.0 1.2 - 2.2   Bilirubin Total 0.4 0.0 - 1.2 mg/dL   Alkaline Phosphatase 77 39 - 117 IU/L   AST 17 0 - 40 IU/L   ALT 20 0 - 44 IU/L  PSA  Result Value Ref Range   Prostate Specific Ag, Serum 3.5 0.0 - 4.0 ng/mL  POCT glycosylated hemoglobin (Hb A1C)  Result Value Ref Range   Hemoglobin A1C 6.2 (A) 4.0 - 5.6 %   HbA1c POC (<> result, manual entry)  4.0 - 5.6 %   HbA1c, POC (prediabetic range)  5.7 - 6.4 %  HbA1c, POC (controlled diabetic range)  0.0 - 7.0 %    Assessment & Plan:   1. Elevated blood sugar   2. Hyperlipidemia associated with type 2 diabetes mellitus ([place]) - on lipitor 40 - recheck lipids today since starting fenofibrate 145  3. Type 2 diabetes mellitus with stage 3 chronic  kidney disease, without long-term current use of  insulin ([place]) - well controlled - cont current regimen w/ metformin 1g bid and trulicity 1.5mg  DM eye exam done by Dr. Marygrace Drought at [facility] Fri 10/08/17  Lab Results  Component Value Date   HGBA1C 6.2 (A) 10/16/2017   HGBA1C 6.1 06/24/2017   HGBA1C 6.2 (H) 03/27/2017   HGBA1C WILL FOLLOW 03/27/2017   HGBA1C 6.9 12/09/2016     4. Vitamin B12 deficiency - level checked 3 mos ago was low at 219 - injection today then start otc supp - recheck at next OV in 3 mos  5. Elevated PSA - At [facility] 2 yrs ago 09/2015 - and then pt never f/u for recheck as was instructed so will recheck today Lab Results  Component Value Date   PSA 3.5 10/16/2017       PSA (at [facility])                           2.15                                  07/26/2015  PSA 2.73 01/24/2015   PSA 1.49 05/13/2010   PSA 1.95 03/26/2009     6.      Benign prostatic hyperplasia with lower urinary tract symptoms, symptom details unspecified -symptoms controlled on alfuzosin 10 mg every morning - continue.  Orders Placed This Encounter  Procedures  . Lipid panel  . Comprehensive metabolic panel  . PSA  . POCT glycosylated hemoglobin (Hb A1C)  . HM Diabetes Foot Exam    Meds ordered this encounter  Medications  . cyanocobalamin ((VITAMIN B-12)) injection 1,000 mcg   I personally performed the services described in this documentation, which was scribed in my presence. The recorded information has been reviewed and considered, and addended by me as needed.   [name], M.D.  Primary Care at [facility] [address] [place], [place] [address] [phone] phone [phone] fax  12/24/17 8:53 AM

## 2017-10-17 LAB — COMPREHENSIVE METABOLIC PANEL
ALBUMIN: 4.5 g/dL (ref 3.5–5.5)
ALT: 20 IU/L (ref 0–44)
AST: 17 IU/L (ref 0–40)
Albumin/Globulin Ratio: 2 (ref 1.2–2.2)
Alkaline Phosphatase: 77 IU/L (ref 39–117)
BILIRUBIN TOTAL: 0.4 mg/dL (ref 0.0–1.2)
BUN/Creatinine Ratio: 7 — ABNORMAL LOW (ref 9–20)
BUN: 11 mg/dL (ref 6–24)
CHLORIDE: 103 mmol/L (ref 96–106)
CO2: 23 mmol/L (ref 20–29)
CREATININE: 1.5 mg/dL — AB (ref 0.76–1.27)
Calcium: 9.2 mg/dL (ref 8.7–10.2)
GFR calc non Af Amer: 54 mL/min/{1.73_m2} — ABNORMAL LOW (ref >59)
GFR, EST AFRICAN AMERICAN: 62 mL/min/{1.73_m2} (ref >59)
GLUCOSE: 115 mg/dL — AB (ref 65–99)
Globulin, Total: 2.3 g/dL (ref 1.5–4.5)
Potassium: 3.9 mmol/L (ref 3.5–5.2)
Sodium: 141 mmol/L (ref 134–144)
TOTAL PROTEIN: 6.8 g/dL (ref 6.0–8.5)

## 2017-10-17 LAB — PSA: Prostate Specific Ag, Serum: 3.5 ng/mL (ref 0.0–4.0)

## 2017-10-17 LAB — LIPID PANEL
CHOL/HDL RATIO: 2.9 ratio (ref 0.0–5.0)
Cholesterol, Total: 76 mg/dL — ABNORMAL LOW (ref 100–199)
HDL: 26 mg/dL — AB (ref >39)
LDL Calculated: 16 mg/dL (ref 0–99)
Triglycerides: 172 mg/dL — ABNORMAL HIGH (ref 0–149)
VLDL Cholesterol Cal: 34 mg/dL (ref 5–40)

## 2017-10-25 ENCOUNTER — Ambulatory Visit: Payer: BC Managed Care – PPO | Admitting: Family Medicine

## 2017-12-13 IMAGING — DX DG ABDOMEN 1V
2 series · 2 of 2 positions shown · non-contrast
Comparison: Single-view of the abdomen 04/06/2007.

CLINICAL DATA: Left side abdominal pain.

EXAM:
ABDOMEN - 1 VIEW

[abdomen kub (1 of 2)]
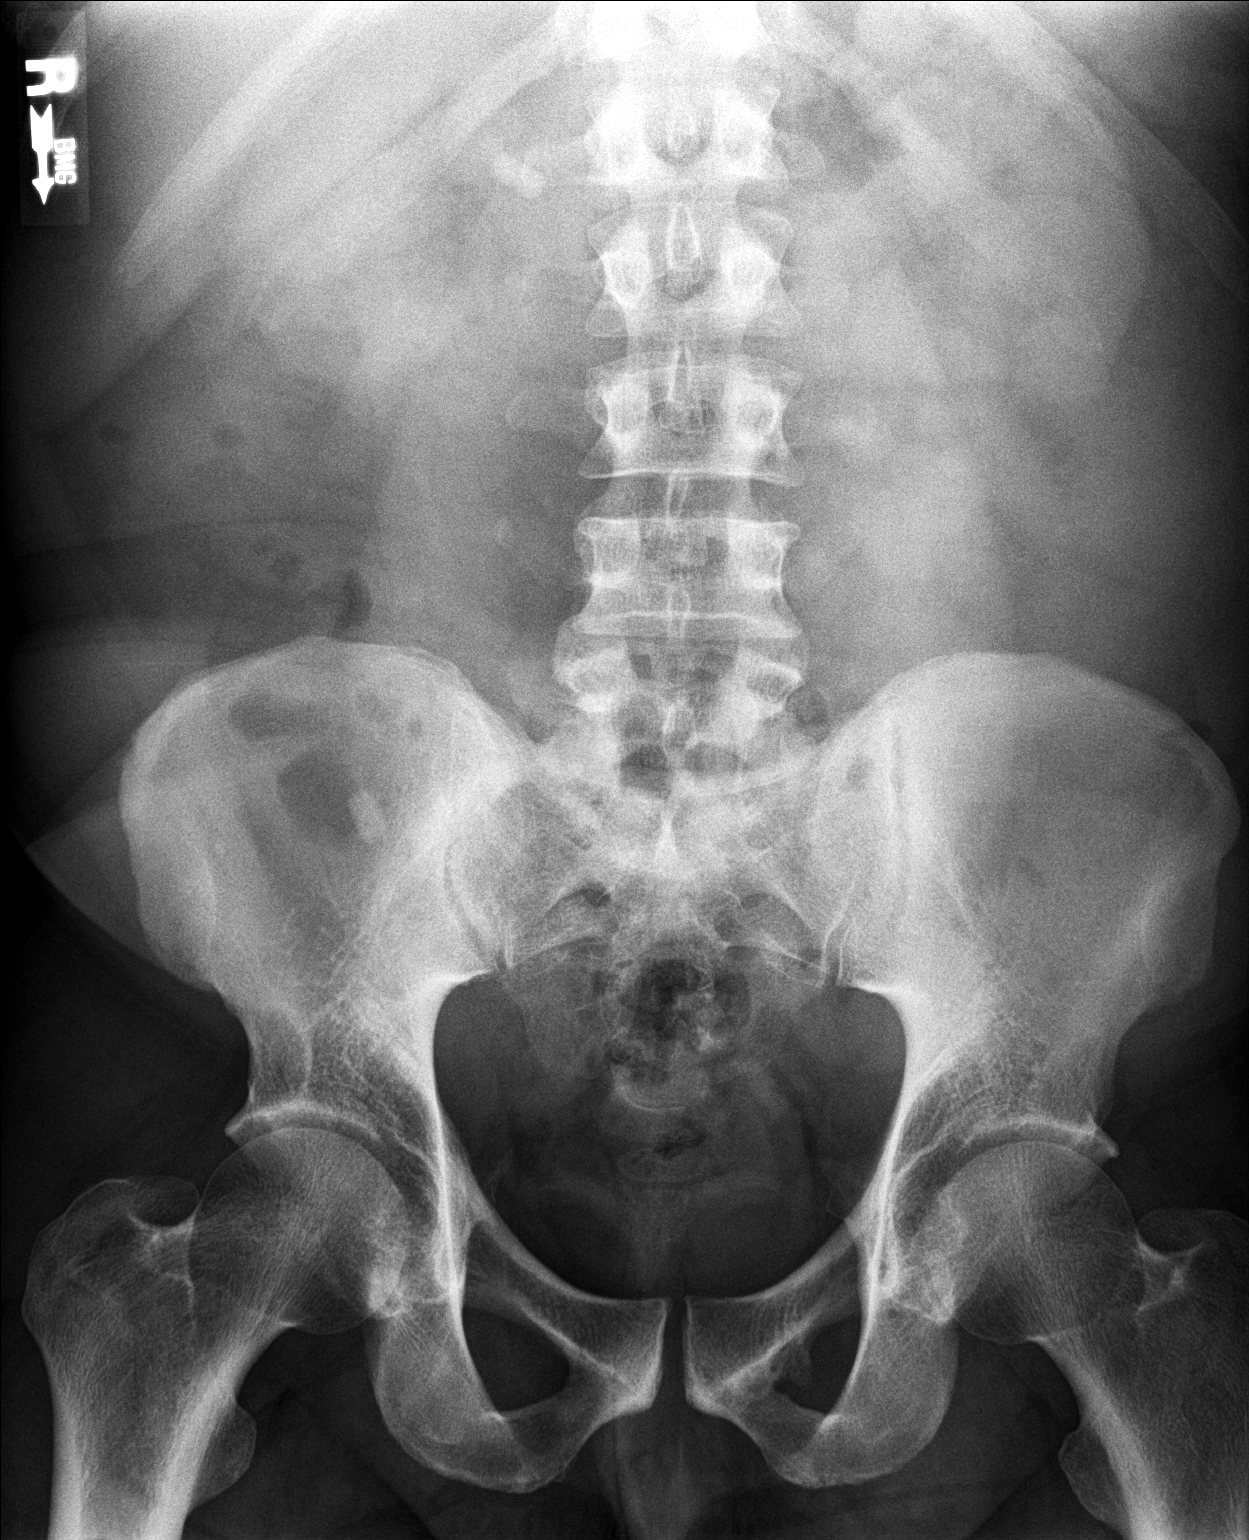

[abdomen kub (2 of 2)]
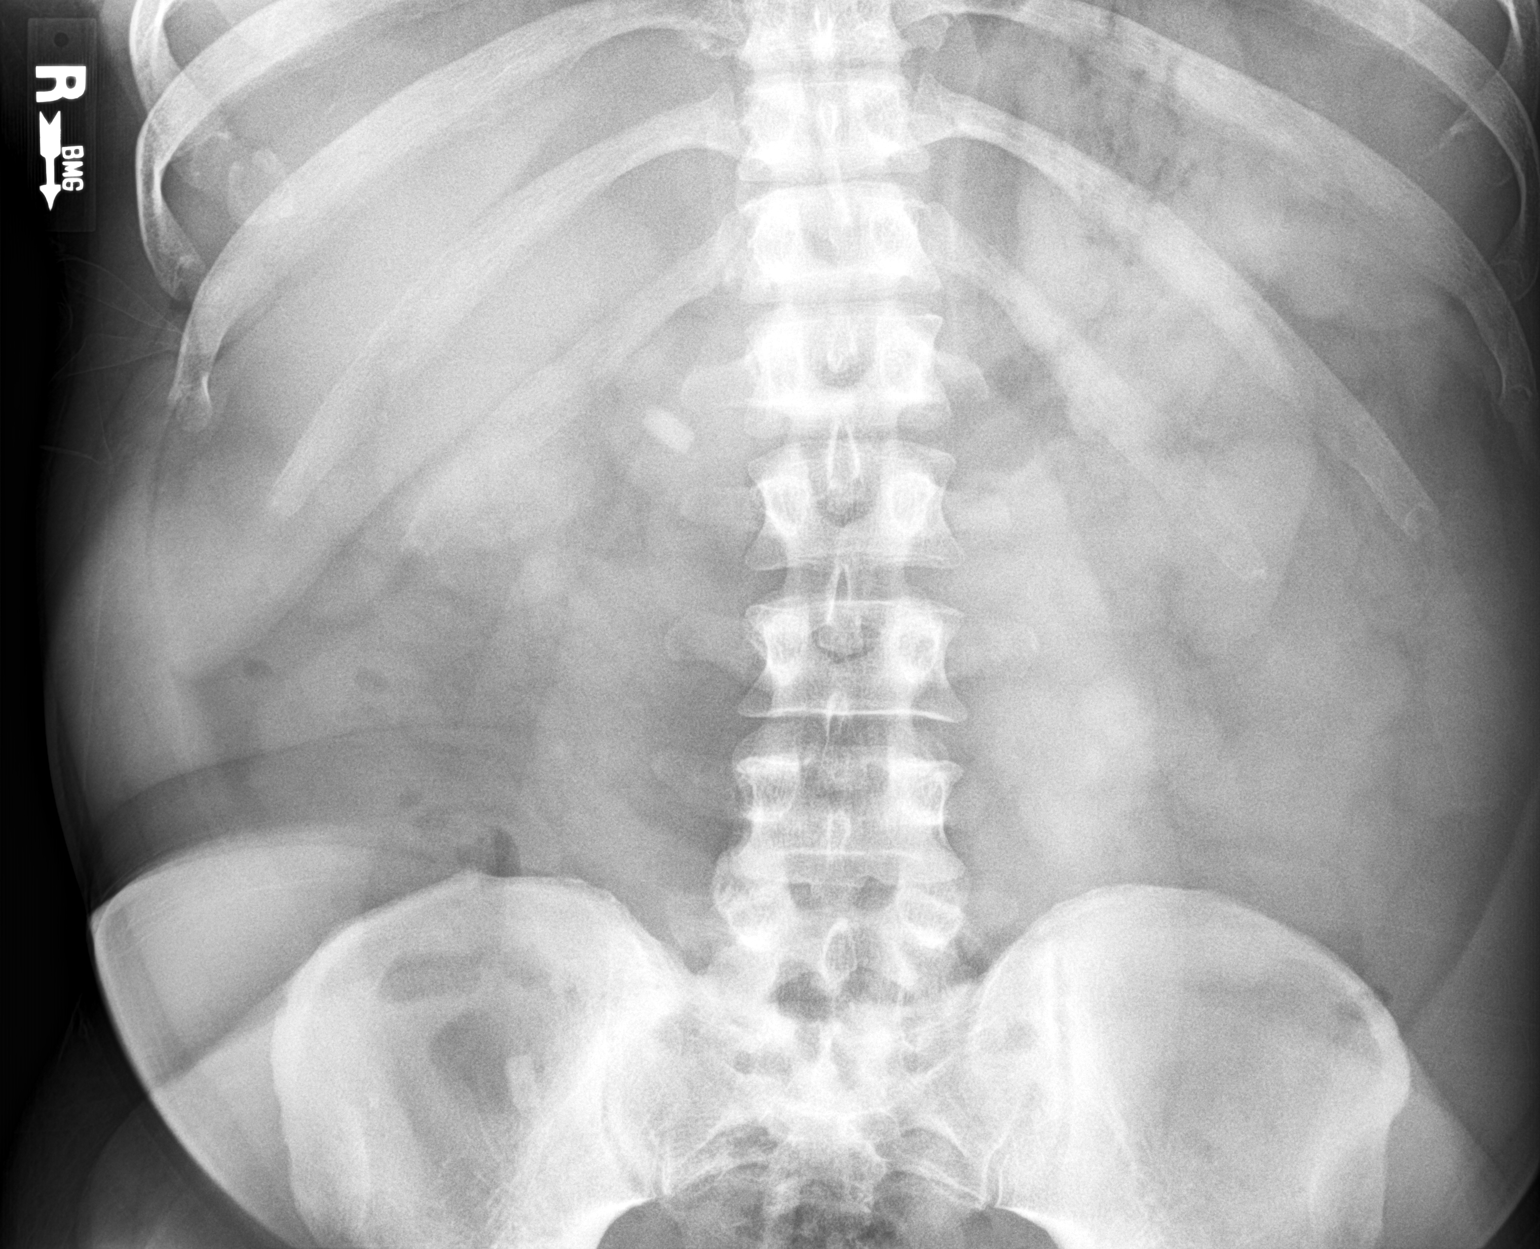

[2 of 2 positions shown; findings below may reference images not displayed]

FINDINGS: The bowel gas pattern is nonobstructive. No evidence of urinary
tract stone. Two ovoid radiopaque densities in the right abdomen are
likely tablets. No focal bony abnormality.
IMPRESSION: Negative exam.

## 2017-12-24 DIAGNOSIS — R972 Elevated prostate specific antigen [PSA]: Secondary | ICD-10-CM | POA: Insufficient documentation

## 2017-12-24 DIAGNOSIS — E538 Deficiency of other specified B group vitamins: Secondary | ICD-10-CM | POA: Insufficient documentation

## 2017-12-25 MED ORDER — METFORMIN HCL 1000 MG PO TABS: ORAL_TABLET | ORAL | 0 refills | Status: DC

## 2017-12-25 MED ORDER — ATORVASTATIN CALCIUM 40 MG PO TABS: 40.0000 mg | ORAL_TABLET | Freq: Every day | ORAL | 1 refills | Status: DC

## 2017-12-25 MED ORDER — OMEPRAZOLE 20 MG PO CPDR: 20.0000 mg | DELAYED_RELEASE_CAPSULE | Freq: Every day | ORAL | 0 refills | Status: DC

## 2017-12-25 MED ORDER — FENOFIBRATE 145 MG PO TABS
145.0000 mg | ORAL_TABLET | Freq: Every day | ORAL | 1 refills | Status: DC
Start: 2017-12-25 — End: 2018-05-19

## 2017-12-25 MED ORDER — DULAGLUTIDE 1.5 MG/0.5ML ~~LOC~~ SOAJ: 1.0000 "application " | SUBCUTANEOUS | 1 refills | Status: DC

## 2017-12-27 ENCOUNTER — Encounter: Payer: Self-pay | Admitting: *Deleted

## 2018-04-11 ENCOUNTER — Other Ambulatory Visit: Payer: Self-pay

## 2018-04-11 ENCOUNTER — Ambulatory Visit: Payer: BC Managed Care – PPO | Admitting: Family Medicine

## 2018-04-11 ENCOUNTER — Encounter: Payer: Self-pay | Admitting: Family Medicine

## 2018-04-11 VITALS — BP 137/87 | HR 78 | Temp 98.3°F | Resp 16 | Ht 67.0 in | Wt 239.2 lb

## 2018-04-11 DIAGNOSIS — Z23 Encounter for immunization: Secondary | ICD-10-CM

## 2018-04-11 DIAGNOSIS — L03012 Cellulitis of left finger: Secondary | ICD-10-CM | POA: Diagnosis not present

## 2018-04-11 DIAGNOSIS — M25511 Pain in right shoulder: Secondary | ICD-10-CM

## 2018-04-11 MED ORDER — DOXYCYCLINE HYCLATE 100 MG PO TABS: 100.0000 mg | ORAL_TABLET | Freq: Two times a day (BID) | ORAL | 0 refills | Status: DC

## 2018-04-11 MED ORDER — MELOXICAM 7.5 MG PO TABS: 7.5000 mg | ORAL_TABLET | Freq: Every day | ORAL | 0 refills | Status: DC

## 2018-04-11 NOTE — Progress Notes (Signed)
I, [employer], acting as a Education administrator for Wendie Agreste, MD, have documented all relevant documentation on the behalf of Wendie Agreste, MD, as directed by Wendie Agreste, MD while in the presence of Wendie Agreste, MD.   Subjective:    Patient ID: Austin Mihalik., male    DOB: 1965/06/10, 53 y.o.   MRN: [id number]  Chief Complaint  Patient presents with  . Hand Pain    left middle finger sore/ swollen 1 wk with, no known injury  . Shoulder Pain    right should hurts when bend a certain way x 1 month, no known injury     HPI Finger Third digit on his left upper extremity is swollen along the side of the nail bed about a week ago.  Past few days there is less soreness in the area. 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   Shoulder Has experienced discomfort for the last two months. Shoulder aches when pt bends it backwards in certain ways.  Every now and then he feels a "tingle" down the arm. No neck pain.  He has not fallen on his shoulder.  He has not changed his activities. He has not treated with over the counter medication. Pt has DM.    Review of Systems  Constitutional: Negative for chills and fever.       Objective:   Physical Exam Constitutional:      General: He is not in acute distress.    Appearance: He is well-developed.  HENT:     Head: Normocephalic and atraumatic.  Cardiovascular:     Rate and Rhythm: Normal rate.  Pulmonary:     Effort: Pulmonary effort is normal.  Musculoskeletal:     Right shoulder: He exhibits decreased range of motion. He exhibits no bony tenderness.     Cervical back: He exhibits normal range of motion (pain free range of motion), no tenderness and no bony tenderness.  Neurological:     Mental Status: He is alert and oriented to person, place, and time.   Left third digit ulnar aspect nailfold minimal soft tissue swelling minimal erythemia. No area of fluctuance no discharge.   Right  shoulder no [place],AC or clavicle tenderness minimal decreased IR otherwise full ROM  Full RTC strength Mild positive neer and hawkins Equal grip strength.     Assessment & Plan:    Austin Valencia. is a 53 y.o. male Paronychia of finger of left hand - Plan: doxycycline (VIBRA-TABS) 100 MG tablet  -Do not appreciate significant area of fluctuance for aspiration at this time.  Peers to be improving.  Will cover with doxycycline for 1 week, with soaks, cleansing, home care discussed as well as RTC precautions if worsening  Need for prophylactic vaccination and inoculation against influenza - Plan: Flu Vaccine QUAD 6+ mos PF IM (Fluarix Quad PF)  Pain in joint of right shoulder - Plan: meloxicam (MOBIC) 7.5 MG tablet  -Suspect component of bursitis versus rotator cuff tendinosis.  Tylenol over-the-counter, activity modification, meloxicam only if needed and short-term, recheck 2 weeks.  Sooner if worse.  Consider subacromial injection if persistent  Meds ordered this encounter  Medications  . meloxicam (MOBIC) 7.5 MG tablet    Sig: Take 1 tablet (7.5 mg total) by mouth daily.    Dispense:  15 tablet    Refill:  0  . doxycycline (VIBRA-TABS) 100 MG tablet    Sig: Take 1 tablet (  100 mg total) by mouth 2 (two) times daily.    Dispense:  20 tablet    Refill:  0   Patient Instructions    Finger pain appears to be a paronychia but that appears to be improving at this time.  I did write antibiotic to take twice per day for the next week, cleanse area with soap and water and warm soaks a few times per day, gentle pressure if it feels more swollen, but if you do notice more swelling please return for recheck as may need procedure.  Shoulder pain likely bursitis, but with pain into the arm sometimes that can be from a pinched nerve.  Start with Tylenol, try to decrease use of that shoulder including decrease lifting the right shoulder for the next week or 2, and meloxicam only if needed up to once  per day.  Do not use that with other over-the-counter medication.  Recheck in the next 2 weeks if not improving, sooner if worse.   Return to the clinic or go to the nearest emergency room if any of your symptoms worsen or new symptoms occur.   Paronychia Paronychia is an infection of the skin that surrounds a nail. It usually affects the skin around a fingernail, but it may also occur near a toenail. It often causes pain and swelling around the nail. In some cases, a collection of pus (abscess) can form near or under the nail.  This condition may develop suddenly, or it may develop gradually over a longer period. In most cases, paronychia is not serious, and it will clear up with treatment. What are the causes? This condition may be caused by bacteria or a fungus. These germs can enter the body through an opening in the skin, such as a cut or a hangnail. What increases the risk? This condition is more likely to develop in people who:  Get their hands wet often, such as those who work as Designer, industrial/product, bartenders, or nurses.  Bite their fingernails or suck their thumbs.  Trim their nails very short.  Have hangnails or injured fingertips.  Get manicures.  Have diabetes. What are the signs or symptoms? Symptoms of this condition include:  Redness and swelling of the skin near the nail.  Tenderness around the nail when you touch the area.  Pus-filled bumps under the skin at the base and sides of the nail (cuticle).  Fluid or pus under the nail.  Throbbing pain in the area. How is this diagnosed? This condition is diagnosed with a physical exam. In some cases, a sample of pus may be tested to determine what type of bacteria or fungus is causing the condition. How is this treated? Treatment depends on the cause and severity of your condition. If your condition is mild, it may clear up on its own in a few days or after soaking in warm water. If needed, treatment may  include:  Antibiotic medicine, if your infection is caused by bacteria.  Antifungal medicine, if your infection is caused by a fungus.  A procedure to drain pus from an abscess.  Anti-inflammatory medicine (corticosteroids). Follow these instructions at home: Wound care  Keep the affected area clean.  Soak the affected area in warm water, if told to do so by your health care provider. You may be told to do this for 20 minutes, 2-3 times a day.  Keep the area dry when you are not soaking it.  Do not try to drain an abscess yourself.  Follow instructions from your health care provider about how to take care of the affected area. Make sure you: ? Wash your hands with soap and water before you change your bandage (dressing). If soap and water are not available, use hand sanitizer. ? Change your dressing as told by your health care provider.  If you had an abscess drained, check the area every day for signs of infection. Check for: ? Redness, swelling, or pain. ? Fluid or blood. ? Warmth. ? Pus or a bad smell. Medicines   Take over-the-counter and prescription medicines only as told by your health care provider.  If you were prescribed an antibiotic medicine, take it as told by your health care provider. Do not stop taking the antibiotic even if you start to feel better. General instructions  Avoid contact with harsh chemicals.  Do not pick at the affected area. Prevention  To prevent this condition from happening again: ? Wear rubber gloves when washing dishes or doing other tasks that require your hands to get wet. ? Wear gloves if your hands might come in contact with cleaners or other chemicals. ? Avoid injuring your nails or fingertips. ? Do not bite your nails or tear hangnails. ? Do not cut your nails very short. ? Do not cut your cuticles. ? Use clean nail clippers or scissors when trimming nails. Contact a health care provider if:  Your symptoms get worse or  do not improve with treatment.  You have continued or increased fluid, blood, or pus coming from the affected area.  Your finger or knuckle becomes swollen or difficult to move. Get help right away if you have:  A fever or chills.  Redness spreading away from the affected area.  Joint or muscle pain. Summary  Paronychia is an infection of the skin that surrounds a nail. It often causes pain and swelling around the nail. In some cases, a collection of pus (abscess) can form near or under the nail.  This condition may be caused by bacteria or a fungus. These germs can enter the body through an opening in the skin, such as a cut or a hangnail.  If your condition is mild, it may clear up on its own in a few days. If needed, treatment may include medicine or a procedure to drain pus from an abscess.  To prevent this condition from happening again, wear gloves if doing tasks that require your hands to get wet or to come in contact with chemicals. Also avoid injuring your nails or fingertips. This information is not intended to replace advice given to you by your health care provider. Make sure you discuss any questions you have with your health care provider. Document Released: 09/02/2000 Document Revised: 03/22/2017 Document Reviewed: 03/22/2017 Elsevier Interactive Patient Education  2019 Elsevier Inc.  Shoulder Pain Many things can cause shoulder pain, including:  An injury to the shoulder.  Overuse of the shoulder.  Arthritis. The source of the pain can be:  Inflammation.  An injury to the shoulder joint.  An injury to a tendon, ligament, or bone. Follow these instructions at home: Pay attention to changes in your symptoms. Let your health care provider know about them. Follow these instructions to relieve your pain. If you have a sling:  Wear the sling as told by your health care provider. Remove it only as told by your health care provider.  Loosen the sling if your  fingers tingle, become numb, or turn cold and blue.  Keep  the sling clean.  If the sling is not waterproof: ? Do not let it get wet. Remove it to shower or bathe.  Move your arm as little as possible, but keep your hand moving to prevent swelling. Managing pain, stiffness, and swelling   If directed, put ice on the painful area: ? Put ice in a plastic bag. ? Place a towel between your skin and the bag. ? Leave the ice on for 20 minutes, 2-3 times per day. Stop applying ice if it does not help with the pain.  Squeeze a soft ball or a foam pad as much as possible. This helps to keep the shoulder from swelling. It also helps to strengthen the arm. General instructions  Take over-the-counter and prescription medicines only as told by your health care provider.  Keep all follow-up visits as told by your health care provider. This is important. Contact a health care provider if:  Your pain gets worse.  Your pain is not relieved with medicines.  New pain develops in your arm, hand, or fingers. Get help right away if:  Your arm, hand, or fingers: ? Tingle. ? Become numb. ? Become swollen. ? Become painful. ? Turn white or blue. Summary  Shoulder pain can be caused by an injury, overuse, or arthritis.  Pay attention to changes in your symptoms. Let your health care provider know about them.  This condition may be treated with a sling, ice, and pain medicines.  Contact your health care provider if the pain gets worse or new pain develops. Get help right away if your arm, hand, or fingers tingle or become numb, swollen, or painful.  Keep all follow-up visits as told by your health care provider. This is important. This information is not intended to replace advice given to you by your health care provider. Make sure you discuss any questions you have with your health care provider. Document Released: 12/17/2004 Document Revised: 09/21/2017 Document Reviewed: 09/21/2017 Elsevier  Interactive Patient Education  [employer].     If you have lab work done today you will be contacted with your lab results within the next 2 weeks.  If you have not heard from [place] then please contact us. The fastest way to get your results is to register for My Chart.   IF you received an x-ray today, you will receive an invoice from [facility]. Please contact [facility] at [phone] with questions or concerns regarding your invoice.   IF you received labwork today, you will receive an invoice from [place]. Please contact [facility] at [phone] with questions or concerns regarding your invoice.   Our billing staff will not be able to assist you with questions regarding bills from these companies.  You will be contacted with the lab results as soon as they are available. The fastest way to get your results is to activate your My Chart account. Instructions are located on the last page of this paperwork. If you have not heard from [place] regarding the results in 2 weeks, please contact this office.        I personally performed the services described in this documentation, which was scribed in my presence. The recorded information has been reviewed and considered for accuracy and completeness, addended by me as needed, and agree with information above.  Signed,   Merri Ray, MD [facility].  04/11/18 10:00 AM

## 2018-04-11 NOTE — Patient Instructions (Addendum)
Finger pain appears to be a paronychia but that appears to be improving at this time.  I did write antibiotic to take twice per day for the next week, cleanse area with soap and water and warm soaks a few times per day, gentle pressure if it feels more swollen, but if you do notice more swelling please return for recheck as may need procedure.  Shoulder pain likely bursitis, but with pain into the arm sometimes that can be from a pinched nerve.  Start with Tylenol, try to decrease use of that shoulder including decrease lifting the right shoulder for the next week or 2, and meloxicam only if needed up to once per day.  Do not use that with other over-the-counter medication.  Recheck in the next 2 weeks if not improving, sooner if worse.   Return to the clinic or go to the nearest emergency room if any of your symptoms worsen or new symptoms occur.   Paronychia Paronychia is an infection of the skin that surrounds a nail. It usually affects the skin around a fingernail, but it may also occur near a toenail. It often causes pain and swelling around the nail. In some cases, a collection of pus (abscess) can form near or under the nail.  This condition may develop suddenly, or it may develop gradually over a longer period. In most cases, paronychia is not serious, and it will clear up with treatment. What are the causes? This condition may be caused by bacteria or a fungus. These germs can enter the body through an opening in the skin, such as a cut or a hangnail. What increases the risk? This condition is more likely to develop in people who:  Get their hands wet often, such as those who work as Designer, industrial/product, bartenders, or nurses.  Bite their fingernails or suck their thumbs.  Trim their nails very short.  Have hangnails or injured fingertips.  Get manicures.  Have diabetes. What are the signs or symptoms? Symptoms of this condition include:  Redness and swelling of the skin near the  nail.  Tenderness around the nail when you touch the area.  Pus-filled bumps under the skin at the base and sides of the nail (cuticle).  Fluid or pus under the nail.  Throbbing pain in the area. How is this diagnosed? This condition is diagnosed with a physical exam. In some cases, a sample of pus may be tested to determine what type of bacteria or fungus is causing the condition. How is this treated? Treatment depends on the cause and severity of your condition. If your condition is mild, it may clear up on its own in a few days or after soaking in warm water. If needed, treatment may include:  Antibiotic medicine, if your infection is caused by bacteria.  Antifungal medicine, if your infection is caused by a fungus.  A procedure to drain pus from an abscess.  Anti-inflammatory medicine (corticosteroids). Follow these instructions at home: Wound care  Keep the affected area clean.  Soak the affected area in warm water, if told to do so by your health care provider. You may be told to do this for 20 minutes, 2-3 times a day.  Keep the area dry when you are not soaking it.  Do not try to drain an abscess yourself.  Follow instructions from your health care provider about how to take care of the affected area. Make sure you: ? Wash your hands with soap and water before you change  your bandage (dressing). If soap and water are not available, use hand sanitizer. ? Change your dressing as told by your health care provider.  If you had an abscess drained, check the area every day for signs of infection. Check for: ? Redness, swelling, or pain. ? Fluid or blood. ? Warmth. ? Pus or a bad smell. Medicines   Take over-the-counter and prescription medicines only as told by your health care provider.  If you were prescribed an antibiotic medicine, take it as told by your health care provider. Do not stop taking the antibiotic even if you start to feel better. General  instructions  Avoid contact with harsh chemicals.  Do not pick at the affected area. Prevention  To prevent this condition from happening again: ? Wear rubber gloves when washing dishes or doing other tasks that require your hands to get wet. ? Wear gloves if your hands might come in contact with cleaners or other chemicals. ? Avoid injuring your nails or fingertips. ? Do not bite your nails or tear hangnails. ? Do not cut your nails very short. ? Do not cut your cuticles. ? Use clean nail clippers or scissors when trimming nails. Contact a health care provider if:  Your symptoms get worse or do not improve with treatment.  You have continued or increased fluid, blood, or pus coming from the affected area.  Your finger or knuckle becomes swollen or difficult to move. Get help right away if you have:  A fever or chills.  Redness spreading away from the affected area.  Joint or muscle pain. Summary  Paronychia is an infection of the skin that surrounds a nail. It often causes pain and swelling around the nail. In some cases, a collection of pus (abscess) can form near or under the nail.  This condition may be caused by bacteria or a fungus. These germs can enter the body through an opening in the skin, such as a cut or a hangnail.  If your condition is mild, it may clear up on its own in a few days. If needed, treatment may include medicine or a procedure to drain pus from an abscess.  To prevent this condition from happening again, wear gloves if doing tasks that require your hands to get wet or to come in contact with chemicals. Also avoid injuring your nails or fingertips. This information is not intended to replace advice given to you by your health care provider. Make sure you discuss any questions you have with your health care provider. Document Released: 09/02/2000 Document Revised: 03/22/2017 Document Reviewed: 03/22/2017 Elsevier Interactive Patient Education  2019  [employer] Inc.  Shoulder Pain Many things can cause shoulder pain, including:  An injury to the shoulder.  Overuse of the shoulder.  Arthritis. The source of the pain can be:  Inflammation.  An injury to the shoulder joint.  An injury to a tendon, ligament, or bone. Follow these instructions at home: Pay attention to changes in your symptoms. Let your health care provider know about them. Follow these instructions to relieve your pain. If you have a sling:  Wear the sling as told by your health care provider. Remove it only as told by your health care provider.  Loosen the sling if your fingers tingle, become numb, or turn cold and blue.  Keep the sling clean.  If the sling is not waterproof: ? Do not let it get wet. Remove it to shower or bathe.  Move your arm as little as  possible, but keep your hand moving to prevent swelling. Managing pain, stiffness, and swelling   If directed, put ice on the painful area: ? Put ice in a plastic bag. ? Place a towel between your skin and the bag. ? Leave the ice on for 20 minutes, 2-3 times per day. Stop applying ice if it does not help with the pain.  Squeeze a soft ball or a foam pad as much as possible. This helps to keep the shoulder from swelling. It also helps to strengthen the arm. General instructions  Take over-the-counter and prescription medicines only as told by your health care provider.  Keep all follow-up visits as told by your health care provider. This is important. Contact a health care provider if:  Your pain gets worse.  Your pain is not relieved with medicines.  New pain develops in your arm, hand, or fingers. Get help right away if:  Your arm, hand, or fingers: ? Tingle. ? Become numb. ? Become swollen. ? Become painful. ? Turn white or blue. Summary  Shoulder pain can be caused by an injury, overuse, or arthritis.  Pay attention to changes in your symptoms. Let your health care provider know  about them.  This condition may be treated with a sling, ice, and pain medicines.  Contact your health care provider if the pain gets worse or new pain develops. Get help right away if your arm, hand, or fingers tingle or become numb, swollen, or painful.  Keep all follow-up visits as told by your health care provider. This is important. This information is not intended to replace advice given to you by your health care provider. Make sure you discuss any questions you have with your health care provider. Document Released: 12/17/2004 Document Revised: 09/21/2017 Document Reviewed: 09/21/2017 Elsevier Interactive Patient Education  [employer].     If you have lab work done today you will be contacted with your lab results within the next 2 weeks.  If you have not heard from [place] then please contact us. The fastest way to get your results is to register for My Chart.   IF you received an x-ray today, you will receive an invoice from [facility]. Please contact [facility] at [phone] with questions or concerns regarding your invoice.   IF you received labwork today, you will receive an invoice from [place]. Please contact [facility] at [phone] with questions or concerns regarding your invoice.   Our billing staff will not be able to assist you with questions regarding bills from these companies.  You will be contacted with the lab results as soon as they are available. The fastest way to get your results is to activate your My Chart account. Instructions are located on the last page of this paperwork. If you have not heard from [place] regarding the results in 2 weeks, please contact this office.

## 2018-04-18 ENCOUNTER — Encounter: Payer: BC Managed Care – PPO | Admitting: Family Medicine

## 2018-05-06 ENCOUNTER — Ambulatory Visit: Payer: Self-pay | Admitting: *Deleted

## 2018-05-06 NOTE — Telephone Encounter (Signed)
Contacted pt in regards to his symptoms; Pt stated when he uses his Dulaglutide (TRULICITY) 1.5 PP/9.4FE SOPN he gets nausea/upset stomach/diarrhea/indigestion every time. He would like to discuss changing medications; he says that he noticed the symptoms a few days after starting the medication around 06/24/17; he says that a couple of days that he takes the medication he has these symptoms; the pt says that he takes trulicity once per week; his fasting blood sugars are 130-135; recommendations made per nurse triage protocol; he normally sees Dr Brigitte Pulse but she is not available; spoke with Lavella Lemons in regards to scheduling pt; pt offered and accepted appointment with Dr Delia Chimes, [place] [address], 05/10/2018 at 0900; he verbalized understanding.   Reason for Disposition . Caller has URGENT medication question about med that PCP prescribed and triager unable to answer question  Answer Assessment - Initial Assessment Questions 1. SYMPTOMS: "Do you have any symptoms?"     Nausea, stomach upset, diarrhea, indigestion 2. SEVERITY: If symptoms are present, ask "Are they mild, moderate or severe?"    severe  Protocols used: MEDICATION QUESTION CALL-A-AH

## 2018-05-09 ENCOUNTER — Ambulatory Visit: Payer: Self-pay | Admitting: Family Medicine

## 2018-05-19 ENCOUNTER — Ambulatory Visit: Payer: BC Managed Care – PPO | Admitting: Family Medicine

## 2018-05-19 ENCOUNTER — Encounter: Payer: Self-pay | Admitting: Family Medicine

## 2018-05-19 ENCOUNTER — Other Ambulatory Visit: Payer: Self-pay

## 2018-05-19 VITALS — BP 132/81 | HR 87 | Temp 98.7°F | Resp 18 | Ht 67.0 in | Wt 234.6 lb

## 2018-05-19 DIAGNOSIS — Z1212 Encounter for screening for malignant neoplasm of rectum: Secondary | ICD-10-CM

## 2018-05-19 DIAGNOSIS — E1122 Type 2 diabetes mellitus with diabetic chronic kidney disease: Secondary | ICD-10-CM

## 2018-05-19 DIAGNOSIS — E538 Deficiency of other specified B group vitamins: Secondary | ICD-10-CM | POA: Diagnosis not present

## 2018-05-19 DIAGNOSIS — R972 Elevated prostate specific antigen [PSA]: Secondary | ICD-10-CM

## 2018-05-19 DIAGNOSIS — N401 Enlarged prostate with lower urinary tract symptoms: Secondary | ICD-10-CM

## 2018-05-19 DIAGNOSIS — N183 Chronic kidney disease, stage 3 unspecified: Secondary | ICD-10-CM

## 2018-05-19 DIAGNOSIS — Z1211 Encounter for screening for malignant neoplasm of colon: Secondary | ICD-10-CM

## 2018-05-19 DIAGNOSIS — E781 Pure hyperglyceridemia: Secondary | ICD-10-CM

## 2018-05-19 LAB — POCT URINALYSIS DIP (MANUAL ENTRY)
BILIRUBIN UA: NEGATIVE mg/dL
Bilirubin, UA: NEGATIVE
Glucose, UA: 500 mg/dL — AB
Leukocytes, UA: NEGATIVE
Nitrite, UA: NEGATIVE
PH UA: 5.5 (ref 5.0–8.0)
PROTEIN UA: NEGATIVE mg/dL
RBC UA: NEGATIVE
SPEC GRAV UA: 1.015 (ref 1.010–1.025)
UROBILINOGEN UA: 0.2 U/dL

## 2018-05-19 LAB — POCT GLYCOSYLATED HEMOGLOBIN (HGB A1C): Hemoglobin A1C: 8 % — AB (ref 4.0–5.6)

## 2018-05-19 MED ORDER — FENOFIBRATE 145 MG PO TABS: 145.0000 mg | ORAL_TABLET | Freq: Every day | ORAL | 1 refills | Status: DC

## 2018-05-19 MED ORDER — ALFUZOSIN HCL ER 10 MG PO TB24: 10.0000 mg | ORAL_TABLET | Freq: Every day | ORAL | 3 refills | Status: DC

## 2018-05-19 MED ORDER — AMLODIPINE BESYLATE 5 MG PO TABS: 5.0000 mg | ORAL_TABLET | Freq: Every day | ORAL | 3 refills | Status: DC

## 2018-05-19 MED ORDER — CYANOCOBALAMIN 1000 MCG/ML IJ SOLN: 1000.0000 ug | Freq: Once | INTRAMUSCULAR | Status: DC

## 2018-05-19 MED ORDER — OMEPRAZOLE 20 MG PO CPDR: 20.0000 mg | DELAYED_RELEASE_CAPSULE | Freq: Every day | ORAL | 3 refills | Status: DC

## 2018-05-19 MED ORDER — ATORVASTATIN CALCIUM 40 MG PO TABS: 40.0000 mg | ORAL_TABLET | Freq: Every day | ORAL | 1 refills | Status: DC

## 2018-05-19 MED ORDER — METFORMIN HCL 1000 MG PO TABS: ORAL_TABLET | ORAL | 2 refills | Status: DC

## 2018-05-19 MED ORDER — CANAGLIFLOZIN 100 MG PO TABS: 100.0000 mg | ORAL_TABLET | Freq: Every day | ORAL | 8 refills | Status: DC

## 2018-05-19 NOTE — Progress Notes (Signed)
Subjective:    Patient: Austin Valencia.  DOB: 07-21-1965; 53 y.o.   MRN: [id number]  Chief Complaint  Patient presents with  . Medication Refill    follow up     HPI Last dose of trulicity was almost 2 weeks ago - having a lot of diarrhea and GI problems with stomach pain as well as felt poorly x 3d each week after taking the trulicity ever since he started it (which was 06/2016 - almost 2 years ago!!!) - never got any better so he did not take the dose last week or this week and all the GI sxs have pretty much resolved. It did lower his blood sugar and so since he stopped taking it, it has gotten back up to 245 from 130s due to increased appetite as well.   He admits to poor med compliance. He forgets to take them sometimes.  Had stopped the lipitor when he started the tricor - didn't know he was supposed to take both.  Medical History Past Medical History:  Diagnosis Date  . Diabetes mellitus without complication ([place]) 16/3846  . GERD (gastroesophageal reflux disease)   . Headache(784.0)   . Hyperlipidemia   . Hypertension    Past Surgical History:  Procedure Laterality Date  . HERNIA REPAIR     umbilical child  . LIPOMA EXCISION Right 11/29/2013   Procedure: EXCISION LIPOMA RIGHT AXILLA;  Surgeon: Autumn Messing III, MD;  Location: [facility];  Service: General;  Laterality: Right;   Current Outpatient Medications on File Prior to Visit  Medication Sig Dispense Refill  . alfuzosin (UROXATRAL) 10 MG 24 hr tablet Take 1 tablet (10 mg total) by mouth daily with breakfast. 90 tablet 3  . amLODipine (NORVASC) 5 MG tablet Take 1 tablet (5 mg total) by mouth daily. 90 tablet 3  . fenofibrate (TRICOR) 145 MG tablet Take 1 tablet (145 mg total) by mouth daily. 90 tablet 1  . metFORMIN (GLUCOPHAGE) 1000 MG tablet TAKE 1 TAB po bid 180 tablet 0  . omeprazole (PRILOSEC) 20 MG capsule Take 1 capsule (20 mg total) by mouth daily. 90 capsule 0  . atorvastatin (LIPITOR) 40 MG  tablet Take 1 tablet (40 mg total) by mouth daily. (Patient not taking: Reported on 05/19/2018) 90 tablet 1  . Blood Gluc Meter Disp-Strips (BLOOD GLUCOSE METER DISPOSABLE) DEVI Please given strips of type to work w/ pt's meter. Needs to check tid due to medication change, hyperglycemia (Patient not taking: Reported on 05/19/2018) 100 each 11  . doxycycline (VIBRA-TABS) 100 MG tablet Take 1 tablet (100 mg total) by mouth 2 (two) times daily. (Patient not taking: Reported on 05/19/2018) 14 tablet 0  . meloxicam (MOBIC) 7.5 MG tablet Take 1 tablet (7.5 mg total) by mouth daily. (Patient not taking: Reported on 05/19/2018) 15 tablet 0   No current facility-administered medications on file prior to visit.    Allergies  Allergen Reactions  . 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 Ace Inhibitors     See 05/23/14 angioedema with Losartan  . Trulicity [Dulaglutide] Diarrhea   Family History  Problem Relation Age of Onset  . Cancer Father        lung  . Diabetes Father   . Hypertension Father   . Hyperlipidemia Mother   . Hypertension Mother    Social History   Socioeconomic History  . Marital status: Single    Spouse name: Not on file  . Number  of children: 2  . Years of education: 44  . Highest education level: Not on file  Occupational History  . Occupation: Custodian  Social Needs  . Financial resource strain: Not on file  . Food insecurity:    Worry: Not on file    Inability: Not on file  . Transportation needs:    Medical: Not on file    Non-medical: Not on file  Tobacco Use  . Smoking status: Never Smoker  . Smokeless tobacco: Never Used  Substance and Sexual Activity  . Alcohol use: No    Alcohol/week: 0.0 standard drinks  . Drug use: No  . Sexual activity: Yes  Lifestyle  . Physical activity:    Days per week: Not on file    Minutes per session: Not on file  . Stress: Not on file  Relationships  . Social connections:    Talks on phone: Not on file    Gets  together: Not on file    Attends religious service: Not on file    Active member of club or organization: Not on file    Attends meetings of clubs or organizations: Not on file    Relationship status: Not on file  Other Topics Concern  . Not on file  Social History Narrative   Born in [place], [place] and raised [place], [place]. Currently resides in a house with his mother. No pets. Fun: Go to sporting events, concerts   Denies religious beliefs that would effect health care.    Depression screen [facility] 2/9 05/19/2018 04/11/2018 10/16/2017 06/24/2017 03/27/2017  Decreased Interest 0 0 0 0 0  Down, Depressed, Hopeless 0 0 0 0 0  PHQ - 2 Score 0 0 0 0 0    ROS As noted in HPI  Objective:  BP 132/81   Pulse 87   Temp 98.7 F (37.1 C) (Oral)   Resp 18   Ht 5' 7"  (1.702 m)   Wt 234 lb 9.6 oz (106.4 kg)   SpO2 94%   BMI 36.74 kg/m  Physical Exam Constitutional:      General: He is not in acute distress.    Appearance: He is well-developed. He is not diaphoretic.  HENT:     Head: Normocephalic and atraumatic.  Eyes:     General: No scleral icterus.    Conjunctiva/sclera: Conjunctivae normal.     Pupils: Pupils are equal, round, and reactive to light.  Neck:     Musculoskeletal: Normal range of motion and neck supple.     Thyroid: No thyromegaly.  Cardiovascular:     Rate and Rhythm: Normal rate and regular rhythm.     Heart sounds: Normal heart sounds.  Pulmonary:     Effort: Pulmonary effort is normal. No respiratory distress.     Breath sounds: Normal breath sounds.  Lymphadenopathy:     Cervical: No cervical adenopathy.  Skin:    General: Skin is warm and dry.  Neurological:     Mental Status: He is alert and oriented to person, place, and time.  Psychiatric:        Behavior: Behavior normal.     [facility] Office Visit on 05/19/2018  Component Date Value Ref Range Status  . Hemoglobin A1C 05/19/2018 8.0* 4.0 - 5.6 % Final  . Color, UA 05/19/2018 yellow  yellow Final  .  Clarity, UA 05/19/2018 clear  clear Final  . Glucose, UA 05/19/2018 =500* negative mg/dL Final  . Bilirubin, UA 05/19/2018 negative  negative Final  .  Ketones, POC UA 05/19/2018 negative  negative mg/dL Final  . Spec Grav, UA 05/19/2018 1.015  1.010 - 1.025 Final  . Blood, UA 05/19/2018 negative  negative Final  . pH, UA 05/19/2018 5.5  5.0 - 8.0 Final  . Protein Ur, POC 05/19/2018 negative  negative mg/dL Final  . Urobilinogen, UA 05/19/2018 0.2  0.2 or 1.0 E.U./dL Final  . Nitrite, UA 05/19/2018 Negative  Negative Final  . Leukocytes, UA 05/19/2018 Negative  Negative Final     Assessment & Plan:   1. Type 2 diabetes mellitus with stage 3 chronic kidney disease, without long-term current use of insulin (HCC) - a1c has increased sig since last visit although really only been off the trulicity a little over a week so likely still in system. Stopped trulicity due to GI sxs but looking back through his chart - it looks like he has reported years of GI sxs even prior to the time he was on trulicity  Was on xigduo XR 12-1998 qd from 06/2015-06/2016 but was d/c'd due to renal function as Cr had bumped to 1.6 and eGFR 58 ( and it was wondered if THIS was the cause of his GI sxs in 05/2016 visit).  However, SGLT2 of farxiga (in xigduo) now approved down to egfr 45 and not contraind until <30 w/ Invokana dose limited to 178m at eGFR 45-59 and as his renal function has been stable for so many years, I feel safe retrying SLGT2 Lab Results  Component Value Date   HGBA1C 8.0 (A) 05/19/2018   HGBA1C 6.2 (A) 10/16/2017   HGBA1C 6.1 06/24/2017   If does well on Invokana - consider changing to Invokomet XR at f/u OV. Baseline cr 1.5-1.57 with eGFR 59-62  Recheck in 3 mos    2. Vitamin B12 deficiency - gave IM vit B12 x 1 today - continue on daily supplement - will need to stay on while on metformin and with the GI sxs - recheck in 6 mos to ensure still adequate on oral supp or if needs to start IM - esp if  still having GI issues Lab Results  Component Value Date   VITAMINB12 347 05/19/2018   VITAMINB12 219 (L) 06/24/2017     3. Benign prostatic hyperplasia with lower urinary tract symptoms, symptom details unspecified - cont alfuzosin and refer to urology for further eval due to PSA and age  53 Elevated PSA - refer to urology as has been slowly but steadily increasing esp considering young age and higher risk as AA and some sxs Lab Results  Component Value Date   PSA1 4.1 (H) 05/19/2018   PSA1 3.5 10/16/2017       PSA                                                             2.73                                 01/24/2015   5. Screening for colorectal cancer - years of abd discomfort often attributed to constipation and DM meds (may be metformin?) - needs initial screening colonoscopy so pt agrees to GI referral  6.      Pure hypertriglyceridemia - will restart  lipitor (pt misunderstood - thought he was supposed to stop when started tricor) and cont on tricor. Fasting at next OV  Reviewed ways to increase med compliance.   Patient will continue on current chronic medications other than changes noted above, so ok to refill when needed.   See after visit summary for patient specific instructions.  Orders Placed This Encounter  Procedures  . Comprehensive metabolic panel    Order Specific Question:   Has the patient fasted?    Answer:   No  . Vitamin B12  . CBC  . Microalbumin / creatinine urine ratio  . LDL cholesterol, direct  . PSA  . Ambulatory referral to Gastroenterology    Referral Priority:   Routine    Referral Type:   Consultation    Referral Reason:   Specialty Services Required    Number of Visits Requested:   1  . POCT glycosylated hemoglobin (Hb A1C)  . POCT urinalysis dipstick    Meds ordered this encounter  Medications  . atorvastatin (LIPITOR) 40 MG tablet    Sig: Take 1 tablet (40 mg total) by mouth daily.    Dispense:  90 tablet    Refill:  1  .  alfuzosin (UROXATRAL) 10 MG 24 hr tablet    Sig: Take 1 tablet (10 mg total) by mouth daily with breakfast.    Dispense:  90 tablet    Refill:  3  . amLODipine (NORVASC) 5 MG tablet    Sig: Take 1 tablet (5 mg total) by mouth daily.    Dispense:  90 tablet    Refill:  3  . fenofibrate (TRICOR) 145 MG tablet    Sig: Take 1 tablet (145 mg total) by mouth daily.    Dispense:  90 tablet    Refill:  1    Please consider 90 day supplies to promote better adherence  . metFORMIN (GLUCOPHAGE) 1000 MG tablet    Sig: TAKE 1 TAB po bid    Dispense:  180 tablet    Refill:  2    Please consider 90 day supplies to promote better adherence  . omeprazole (PRILOSEC) 20 MG capsule    Sig: Take 1 capsule (20 mg total) by mouth daily.    Dispense:  90 capsule    Refill:  3  . cyanocobalamin ((VITAMIN B-12)) injection 1,000 mcg  . canagliflozin (INVOKANA) 100 MG TABS tablet    Sig: Take 1 tablet (100 mg total) by mouth daily before breakfast.    Dispense:  30 tablet    Refill:  8    Patient verbalized to me that they understand the following: diagnosis, what is being done for them, what to expect and what should be done at home.  Their questions have been answered. They understand that I am unable to predict every possible medication interaction or adverse outcome and that if any unexpected symptoms arise, they should contact us and their pharmacist, as well as never hesitate to seek urgent/emergent care at [facility] or ER if they think it might be warranted.    [name], MD, MPH [facility] [place], [place]  [address] [phone] Office phone  [phone] Office fax  05/19/18 10:47 AM

## 2018-05-19 NOTE — Patient Instructions (Addendum)
   If you have lab work done today you will be contacted with your lab results within the next 2 weeks.  If you have not heard from us then please contact us. The fastest way to get your results is to register for My Chart.   IF you received an x-ray today, you will receive an invoice from [facility]. Please contact [facility] at [phone] with questions or concerns regarding your invoice.   IF you received labwork today, you will receive an invoice from [facility]. Please contact [facility] at [phone] with questions or concerns regarding your invoice.   Our billing staff will not be able to assist you with questions regarding bills from these companies.  You will be contacted with the lab results as soon as they are available. The fastest way to get your results is to activate your My Chart account. Instructions are located on the last page of this paperwork. If you have not heard from us regarding the results in 2 weeks, please contact this office.    Unfortunately, I will no longer be at [facility] after Friday July 15, 2018.   On Saturday July 16, 2018, my amazing business partner Shana Gordon and I will open a independent, small, boutique medical and mental health care practice called [facility]. Within the next few weeks, you will be able to find our website and facebook page on google. But for now, if you need more information about [facility], you may contact us at: Phone number: [phone] Email: [email or web address]   While we look forward to partnering with any person who wishes to establish with us as I continue to provide traditional primary medical care services including acute and chronic disease management and preventative health services in this unique setting, we aim to specifically tailor our services to meet the needs of our LGBTQ+ community with more convenience, access, and specialization.  Initially, this will mainly center around excellence in transgender hormone management, evaluation and letters for gender-affirming hormones, surgeries, and changes of gender markers.   For its inaugural few summer months, [facility] will be open on a part-time basis only and operating out of  [facility]  [address] [place], [place] [address]   ( We hope to gather a sufficient volume of loyal clientele over the summer to justify [facility] leaving the nest in <6 months from its opening ,which will allow us to greatly expanded our office hours and services. Therefore, if you want to find me at any point in the future to receive care, and google has failed, misled, or confused you, please don't hesitate to contact [email or web address] for to receive the most up-to-date information on my practice location, website, and contact information.)  We are not yet able to start scheduling patients but will be soon.  If you would like to be put on a wait-list to be contacted once we are able to do so, please email [email or web address].    Your medical records are the property of [facility], so I will not have any access to them - including the notes I wrote, tests I ordered, and medications I prescribed - after 07/15/18.  Therefore, at your first visit at [facility], you have the option of EITHER assuming a false identity complete with ridiculous disguise and preposterous accent to see how long it takes me to figure out your true identity, OR spend the time discussing your medical concerns, reviewing preventative health measures, and   refilling medications.  Should you choose the latter, please stop by the front desk on your way out of the office today to sign a record release giving [facility] your permission to send a copy of your complete medical records to [facility].  Please request that your records are sent as soon as they are available to the address and/or fax number above so that I have time  to manually enter your medical history and information into [facility]'s medical record system prior to your initial visit.    If you are currently unsure or undecided as to what fortunate provider will have the honor to provide your future medical care, you can always go ahead and sign the record release for [facility] to share your complete medical chart with [facility] and vice versa, but request that the staff simply  scan the signed into your chart where it will be valid for a year. Should you decide to schedule an appointment with [facility] at some later date, [facility] can then obtain a copy of your records with a simple phone call, saving you the hassel of driving to the office just to sign a piece of paper.     

## 2018-05-20 LAB — COMPREHENSIVE METABOLIC PANEL
ALBUMIN: 4.2 g/dL (ref 4.0–5.0)
ALT: 25 IU/L (ref 0–44)
AST: 16 IU/L (ref 0–40)
Albumin/Globulin Ratio: 1.8 (ref 1.2–2.2)
Alkaline Phosphatase: 100 IU/L (ref 39–117)
BUN / CREAT RATIO: 8 — AB (ref 9–20)
BUN: 12 mg/dL (ref 6–24)
Bilirubin Total: 0.3 mg/dL (ref 0.0–1.2)
CALCIUM: 9.2 mg/dL (ref 8.7–10.2)
CO2: 20 mmol/L (ref 20–29)
Chloride: 102 mmol/L (ref 96–106)
Creatinine, Ser: 1.45 mg/dL — ABNORMAL HIGH (ref 0.76–1.27)
GFR, EST AFRICAN AMERICAN: 64 mL/min/{1.73_m2} (ref >59)
GFR, EST NON AFRICAN AMERICAN: 56 mL/min/{1.73_m2} — AB (ref >59)
GLUCOSE: 460 mg/dL — AB (ref 65–99)
Globulin, Total: 2.3 g/dL (ref 1.5–4.5)
Potassium: 4.4 mmol/L (ref 3.5–5.2)
Sodium: 138 mmol/L (ref 134–144)
TOTAL PROTEIN: 6.5 g/dL (ref 6.0–8.5)

## 2018-05-20 LAB — MICROALBUMIN / CREATININE URINE RATIO
CREATININE, UR: 66 mg/dL
MICROALBUM., U, RANDOM: 5.7 ug/mL
Microalb/Creat Ratio: 9 mg/g creat (ref 0–29)

## 2018-05-20 LAB — CBC
Hematocrit: 39.9 % (ref 37.5–51.0)
Hemoglobin: 13.8 g/dL (ref 13.0–17.7)
MCH: 32.5 pg (ref 26.6–33.0)
MCHC: 34.6 g/dL (ref 31.5–35.7)
MCV: 94 fL (ref 79–97)
PLATELETS: 218 10*3/uL (ref 150–450)
RBC: 4.24 x10E6/uL (ref 4.14–5.80)
RDW: 13 % (ref 11.6–15.4)
WBC: 4.6 10*3/uL (ref 3.4–10.8)

## 2018-05-20 LAB — LDL CHOLESTEROL, DIRECT: LDL DIRECT: 52 mg/dL (ref 0–99)

## 2018-05-20 LAB — VITAMIN B12: Vitamin B-12: 347 pg/mL (ref 232–1245)

## 2018-05-20 LAB — PSA: Prostate Specific Ag, Serum: 4.1 ng/mL — ABNORMAL HIGH (ref 0.0–4.0)

## 2018-05-30 ENCOUNTER — Encounter: Payer: Self-pay | Admitting: Radiology

## 2018-06-01 ENCOUNTER — Encounter: Payer: Self-pay | Admitting: Gastroenterology

## 2018-06-02 ENCOUNTER — Telehealth: Payer: Self-pay | Admitting: Family Medicine

## 2018-06-02 NOTE — Telephone Encounter (Signed)
Copied from [place] [phone]. Topic: Quick Communication - Rx Refill/Question >> Jun 02, 2018 10:20 AM Andria Frames L wrote: Medication: canagliflozin (INVOKANA) 100 MG TABS tablet [[id number]] needs pa. Legrand Como calling from cover my meds states that this was closed out because clinical answers went unanswered. Please advise Ref#[id number]

## 2018-06-07 NOTE — Telephone Encounter (Signed)
I believe this is a PA

## 2018-06-11 ENCOUNTER — Ambulatory Visit: Payer: BC Managed Care – PPO | Admitting: Family Medicine

## 2018-06-11 ENCOUNTER — Encounter: Payer: Self-pay | Admitting: Family Medicine

## 2018-06-11 ENCOUNTER — Other Ambulatory Visit: Payer: Self-pay

## 2018-06-11 VITALS — BP 118/78 | HR 82 | Temp 97.4°F | Ht 67.0 in | Wt 222.2 lb

## 2018-06-11 DIAGNOSIS — E1165 Type 2 diabetes mellitus with hyperglycemia: Secondary | ICD-10-CM

## 2018-06-11 LAB — COMPREHENSIVE METABOLIC PANEL
ALT: 23 IU/L (ref 0–44)
AST: 14 IU/L (ref 0–40)
Albumin/Globulin Ratio: 2.3 — ABNORMAL HIGH (ref 1.2–2.2)
Albumin: 4.5 g/dL (ref 4.0–5.0)
Alkaline Phosphatase: 94 IU/L (ref 39–117)
BUN/Creatinine Ratio: 9 (ref 9–20)
BUN: 13 mg/dL (ref 6–24)
Bilirubin Total: 0.4 mg/dL (ref 0.0–1.2)
CO2: 21 mmol/L (ref 20–29)
Calcium: 9.5 mg/dL (ref 8.7–10.2)
Chloride: 97 mmol/L (ref 96–106)
Creatinine, Ser: 1.42 mg/dL — ABNORMAL HIGH (ref 0.76–1.27)
GFR calc Af Amer: 66 mL/min/{1.73_m2} (ref >59)
GFR calc non Af Amer: 57 mL/min/{1.73_m2} — ABNORMAL LOW (ref >59)
Globulin, Total: 2 g/dL (ref 1.5–4.5)
Glucose: 425 mg/dL — ABNORMAL HIGH (ref 65–99)
Potassium: 4.2 mmol/L (ref 3.5–5.2)
Sodium: 135 mmol/L (ref 134–144)
Total Protein: 6.5 g/dL (ref 6.0–8.5)

## 2018-06-11 LAB — GLUCOSE, POCT (MANUAL RESULT ENTRY): POC Glucose: >444 mg/dl (ref 70–99)

## 2018-06-11 LAB — POCT URINALYSIS DIP (MANUAL ENTRY)
Bilirubin, UA: NEGATIVE
Blood, UA: NEGATIVE
Glucose, UA: >=1000 mg/dL — AB
Leukocytes, UA: NEGATIVE
Nitrite, UA: NEGATIVE
Spec Grav, UA: 1.015 (ref 1.010–1.025)
Urobilinogen, UA: 0.2 E.U./dL
pH, UA: 5.5 (ref 5.0–8.0)

## 2018-06-11 MED ORDER — "PEN NEEDLES 5/16"" 31G X 8 MM MISC": 0 refills | Status: DC

## 2018-06-11 MED ORDER — INSULIN GLARGINE 100 UNIT/ML SOLOSTAR PEN: 20.0000 [IU] | PEN_INJECTOR | Freq: Every day | SUBCUTANEOUS | 0 refills | Status: DC

## 2018-06-11 MED ORDER — DULAGLUTIDE 0.75 MG/0.5ML ~~LOC~~ SOAJ: 0.7500 mg | SUBCUTANEOUS | 2 refills | Status: DC

## 2018-06-11 MED ORDER — BASAGLAR KWIKPEN 100 UNIT/ML ~~LOC~~ SOPN: 20.0000 [IU] | PEN_INJECTOR | Freq: Every day | SUBCUTANEOUS | 5 refills | Status: DC

## 2018-06-11 NOTE — Patient Instructions (Addendum)
Start lantus 10 units daily, increase by 2 units every 3 days lantus controls fasting glucose, goal [id number] Start trulicity Check sugar twice a daty, fasting and 2 hours after dinner  Hyperglycemic Hyperosmolar State Hyperglycemic hyperosmolar state is a serious condition in which you experience an extreme increase in your blood sugar (glucose) level. This makes your body become extremely dehydrated, which can be life-threatening. This condition is a result of uncontrolled or undiagnosed diabetes. It occurs most often in people who have type 2 diabetes (type 2 diabetes mellitus). Certain hormones (insulin and glucagon) control the level of glucose that is in the blood. Insulin lowers blood glucose, and glucagon increases blood glucose. Hyperglycemia can result from having too little insulin in the bloodstream, or from the body not responding normally to insulin. Normally, the body gets rid of excess glucose through urine. If you do not drink enough fluids, or if you drink fluids that contain sugar, your body cannot get rid of excess glucose. This can result in hyperglycemic hyperosmolar state. What are the causes? This condition may be caused by:  Infection.  Medicines that cause you to become dehydrated or cause you to lose fluid.  Certain illnesses.  Not taking your diabetes medicine.  New onset or diagnosis of diabetes.  Cardiovascular disease (CVD). What increases the risk? The following factors make you more likely to develop this condition:  Older age.  Poor management of diabetes.  Inability to eat or drink normally.  Heart failure.  Infection.  Surgery.  Illness. What are the signs or symptoms? Symptoms of this condition include:  Extreme or increased thirst. This symptom may gradually disappear.  Needing to urinate more often than usual.  Dry mouth.  Warm, dry skin that does not sweat even in high temperatures.  High fever.  Sleepiness or  confusion.  Vision problems or vision loss.  Seeing, hearing, tasting, smelling, or feeling things that are not real (hallucinations).  Weakness.  Weight loss.  Vomiting. How is this diagnosed? Hyperglycemic hyperosmolar state is diagnosed based on your medical history, your symptoms, and a blood test to measure your blood glucose level. How is this treated? This condition is treated in the hospital. The goals of treatment are:  To correct dehydration by replacing fluids that you have lost. Fluids will be given through an IV tube.  To improve blood sugar levels using insulin or other medicines as needed.  To treat the cause of hyperglycemia, such as an infection, illness, or newly diagnosed diabetes. Follow these instructions at home: General instructions  Take over-the-counter and prescription medicines only as told by your health care provider.  Do not use any products that contain nicotine or tobacco, such as cigarettes and e-cigarettes. If you need help quitting, ask your health care provider.  Limit alcohol intake to no more than 1 drink a day for nonpregnant women and 2 drinks a day for men. One drink equals 12 oz of beer, 5 oz of wine, or 1 oz of hard liquor.  Stay hydrated, especially when you exercise, when you get sick, or when you spend time in hot temperatures.  Learn to manage stress. If you need help with this, ask your health care provider.  Keep all follow-up visits as told by your health care provider. This is important. Eating and drinking   Maintain a healthy weight.  Exercise regularly, as directed by your health care provider.  Eat healthy foods, such as: ? Lean proteins. ? Complex carbohydrates. ? Fresh fruits and vegetables. ?  Low-fat dairy products. ? Healthy fats.  Drink enough fluid to keep your urine clear or pale yellow. If You Have Diabetes:   Make sure you know the early signs and symptoms of hyperglycemia.  Follow your diabetes  management plan, as told by your health care provider. Make sure you: ? Take your insulin and medicines as directed. ? Follow your exercise plan. ? Follow your meal plan. Eat on time, and do not skip meals. ? Check your blood glucose as often as directed. Make sure to check your blood glucose before and after exercise. If you exercise longer or in a different way than usual, check your blood glucose more often. ? Follow your sick day plan whenever you cannot eat or drink normally. Make this plan in advance with your health care provider.  Share your diabetes management plan with people in your workplace, school, and household.  Check your urine for ketones when you are ill and as often as told by your health care provider.  Carry a medical alert card or wear medical alert jewelry. Contact a health care provider if:  You cannot eat or drink without throwing up.  You develop a fever. Get help right away if:  You develop symptoms of hyperglycemic hyperosmolar state. These symptoms may represent a serious problem that is an emergency. Do not wait to see if the symptoms will go away. Get medical help right away. Call your local emergency services (911 in the [place] Do not drive yourself to the hospital. Summary  Hyperglycemic hyperosmolar state is a serious condition in which you experience an extreme increase in your blood sugar (glucose) level. This makes your body become extremely dehydrated, which can be life-threatening.  This condition is a result of uncontrolled or undiagnosed diabetes. It occurs most often in people who have type 2 diabetes (type 2 diabetes mellitus).  This condition is treated in the hospital. Treatment may include fluids given through an IV tube and other medicines.  Make sure you know the early signs and symptoms of hyperglycemia.  Follow your diabetes management plan, as told by your health care provider. This information is not intended to replace advice given  to you by your health care provider. Make sure you discuss any questions you have with your health care provider. Document Released: 01/10/2004 Document Revised: 03/02/2016 Document Reviewed: 03/02/2016 Elsevier Interactive Patient Education  [employer].    If you have lab work done today you will be contacted with your lab results within the next 2 weeks.  If you have not heard from [place] then please contact us. The fastest way to get your results is to register for My Chart.   IF you received an x-ray today, you will receive an invoice from [facility]. Please contact [facility] at [phone] with questions or concerns regarding your invoice.   IF you received labwork today, you will receive an invoice from [facility]. Please contact [facility] at [phone] with questions or concerns regarding your invoice.   Our billing staff will not be able to assist you with questions regarding bills from these companies.  You will be contacted with the lab results as soon as they are available. The fastest way to get your results is to activate your My Chart account. Instructions are located on the last page of this paperwork. If you have not heard from [place] regarding the results in 2 weeks, please contact this office.

## 2018-06-11 NOTE — Progress Notes (Signed)
3/21/202010:28 AM  Austin Valencia. March 16, 1966, 53 y.o., male [id number]  Chief Complaint  Patient presents with  . Diabetes    f/u     HPI:   Patient is a 53 y.o. male with past medical history significant for DM2, CKD 3, HLP, vitamin b12 deficiency who presents today for elevated glucose at home, 432 this morning  Last OV in feb 2020 with Dr Brigitte Pulse Just prior to last OV he took himself off trulicity due to GI side effects Since then he has noticed significant raise in cbgs Denies any SOB, nausea, vomiting, abd pain, malaise Endorses polydipsia and polyuria  He saw another doctor a week ago and he was started on tradjenta, not helping     Lab Results  Component Value Date   HGBA1C 8.0 (A) 05/19/2018   HGBA1C 6.2 (A) 10/16/2017   HGBA1C 6.1 06/24/2017   Lab Results  Component Value Date   MICROALBUR 1.2 02/07/2016   [place] 16 10/16/2017   CREATININE 1.45 (H) 05/19/2018  GFR 64  Fall Risk  06/11/2018 05/19/2018 04/11/2018 10/16/2017 06/24/2017  Falls in the past year? 0 0 0 No No  Number falls in past yr: 0 - - - -  Injury with Fall? 0 - - - -  Follow up Falls evaluation completed Falls evaluation completed - - -     Depression screen [facility] 2/9 06/11/2018 05/19/2018 04/11/2018  Decreased Interest 0 0 0  Down, Depressed, Hopeless 0 0 0  PHQ - 2 Score 0 0 0    Allergies  Allergen Reactions  . 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 Ace Inhibitors     See 05/23/14 angioedema with Losartan  . Trulicity [Dulaglutide] Diarrhea    Prior to Admission medications   Medication Sig Start Date End Date Taking? Authorizing Provider  alfuzosin (UROXATRAL) 10 MG 24 hr tablet Take 1 tablet (10 mg total) by mouth daily with breakfast. 05/19/18  Yes Shawnee Knapp, MD  amLODipine (NORVASC) 5 MG tablet Take 1 tablet (5 mg total) by mouth daily. 05/19/18  Yes Shawnee Knapp, MD  Blood Gluc Meter Disp-Strips (BLOOD GLUCOSE METER DISPOSABLE) DEVI Please given strips of type to work w/  pt's meter. Needs to check tid due to medication change, hyperglycemia 06/12/16  Yes Shawnee Knapp, MD  fenofibrate (TRICOR) 145 MG tablet Take 1 tablet (145 mg total) by mouth daily. 05/19/18  Yes Shawnee Knapp, MD  linagliptin (TRADJENTA) 5 MG TABS tablet Take 5 mg by mouth daily.   Yes [provider]  metFORMIN (GLUCOPHAGE) 1000 MG tablet TAKE 1 TAB po bid 05/19/18  Yes Shawnee Knapp, MD  omeprazole (PRILOSEC) 20 MG capsule Take 1 capsule (20 mg total) by mouth daily. 05/19/18  Yes Shawnee Knapp, MD    Past Medical History:  Diagnosis Date  . Diabetes mellitus without complication ([place]) 79/8921  . GERD (gastroesophageal reflux disease)   . Headache(784.0)   . Hyperlipidemia   . Hypertension     Past Surgical History:  Procedure Laterality Date  . HERNIA REPAIR     umbilical child  . LIPOMA EXCISION Right 11/29/2013   Procedure: EXCISION LIPOMA RIGHT AXILLA;  Surgeon: Autumn Messing III, MD;  Location: [place];  Service: General;  Laterality: Right;    Social History   Tobacco Use  . Smoking status: Never Smoker  . Smokeless tobacco: Never Used  Substance Use Topics  . Alcohol use: No  Alcohol/week: 0.0 standard drinks    Family History  Problem Relation Age of Onset  . Cancer Father        lung  . Diabetes Father   . Hypertension Father   . Hyperlipidemia Mother   . Hypertension Mother     Review of Systems  Constitutional: Negative for chills and fever.  Eyes: Negative for blurred vision and double vision.  Respiratory: Negative for cough and shortness of breath.   Cardiovascular: Negative for chest pain, palpitations and leg swelling.  Gastrointestinal: Negative for abdominal pain, nausea and vomiting.  per hpi   OBJECTIVE:  Today's Vitals   06/11/18 1005  BP: 118/78  Pulse: 82  Temp: (!) 97.4 F (36.3 C)  TempSrc: Oral  SpO2: 94%  Weight: 222 lb 3.2 oz (100.8 kg)  Height: 5\' 7"  (1.702 m)   Body mass index is 34.8 kg/m.   Physical  Exam Vitals signs and nursing note reviewed.  Constitutional:      Appearance: He is well-developed.  HENT:     Head: Normocephalic and atraumatic.  Eyes:     Conjunctiva/sclera: Conjunctivae normal.     Pupils: Pupils are equal, round, and reactive to light.  Neck:     Musculoskeletal: Neck supple.  Cardiovascular:     Rate and Rhythm: Normal rate and regular rhythm.     Heart sounds: No murmur. No friction rub. No gallop.   Pulmonary:     Effort: Pulmonary effort is normal.     Breath sounds: Normal breath sounds. No wheezing or rales.  Skin:    General: Skin is warm and dry.  Neurological:     General: No focal deficit present.     Mental Status: He is alert and oriented to person, place, and time.      Results for orders placed or performed in visit on 06/11/18 (from the past 24 hour(s))  POCT urinalysis dipstick     Status: Abnormal   Collection Time: 06/11/18 10:19 AM  Result Value Ref Range   Color, UA yellow yellow   Clarity, UA clear clear   Glucose, UA >=1,000 (A) negative mg/dL   Bilirubin, UA negative negative   Ketones, POC UA small (15) (A) negative mg/dL   Spec Grav, UA 1.015 1.010 - 1.025   Blood, UA negative negative   pH, UA 5.5 5.0 - 8.0   Protein Ur, POC trace (A) negative mg/dL   Urobilinogen, UA 0.2 0.2 or 1.0 E.U./dL   Nitrite, UA Negative Negative   Leukocytes, UA Negative Negative  POCT glucose (manual entry)     Status: None   Collection Time: 06/11/18 10:21 AM  Result Value Ref Range   POC Glucose >444 70 - 99 mg/dl  Comprehensive metabolic panel     Status: Abnormal   Collection Time: 06/11/18 10:43 AM  Result Value Ref Range   Glucose 425 (H) 65 - 99 mg/dL   BUN 13 6 - 24 mg/dL   Creatinine, Ser 1.42 (H) 0.76 - 1.27 mg/dL   GFR calc non Af Amer 57 (L) >59 mL/min/1.73   GFR calc Af Amer 66 >59 mL/min/1.73   BUN/Creatinine Ratio 9 9 - 20   Sodium 135 134 - 144 mmol/L   Potassium 4.2 3.5 - 5.2 mmol/L   Chloride 97 96 - 106 mmol/L    CO2 21 20 - 29 mmol/L   Calcium 9.5 8.7 - 10.2 mg/dL   Total Protein 6.5 6.0 - 8.5 g/dL   Albumin 4.5 4.0 -  5.0 g/dL   Globulin, Total 2.0 1.5 - 4.5 g/dL   Albumin/Globulin Ratio 2.3 (H) 1.2 - 2.2   Bilirubin Total 0.4 0.0 - 1.2 mg/dL   Alkaline Phosphatase 94 39 - 117 IU/L   AST 14 0 - 40 IU/L   ALT 23 0 - 44 IU/L   Narrative   Performed at:  [address], [place], [place]  [id number] Lab Director: Rush Farmer MD, Phone:  [phone]    ASSESSMENT and PLAN  1. Uncontrolled type 2 diabetes mellitus with hyperglycemia (HCC) No current red flags. Discussed starting glargine. Discussed restarting low dose GLP1, will monitor sx. Discussed home cbg monitoring. Strict ER precautions given.  - POCT glucose (manual entry) - POCT urinalysis dipstick - Comprehensive metabolic panel  Other orders - linagliptin (TRADJENTA) 5 MG TABS tablet; Take 5 mg by mouth daily. - Dulaglutide (TRULICITY) 9.44 HQ/7.5FF SOPN; Inject 0.75 mg into the skin once a week. - Insulin Pen Needle (PEN NEEDLES 31GX5/16") 31G X 8 MM MISC; 1 pen needle per lantus injection - Insulin Glargine (BASAGLAR KWIKPEN) 100 UNIT/ML SOPN; Inject 0.2 mLs (20 Units total) into the skin at bedtime.  Return for e visit in one week.    Rutherford Guys, MD [facility] [place], [place] [address] Ph.  [phone] Fax [phone]

## 2018-06-21 ENCOUNTER — Ambulatory Visit (AMBULATORY_SURGERY_CENTER): Payer: Self-pay | Admitting: *Deleted

## 2018-06-21 ENCOUNTER — Other Ambulatory Visit: Payer: Self-pay

## 2018-06-21 VITALS — Ht 67.0 in | Wt 218.0 lb

## 2018-06-21 DIAGNOSIS — Z1211 Encounter for screening for malignant neoplasm of colon: Secondary | ICD-10-CM

## 2018-06-21 MED ORDER — NA SULFATE-K SULFATE-MG SULF 17.5-3.13-1.6 GM/177ML PO SOLN: ORAL | 0 refills | Status: DC

## 2018-06-21 NOTE — Addendum Note (Signed)
Addended by: Levonne Spiller on: 06/21/2018 09:43 AM   Modules accepted: Level of Service

## 2018-06-21 NOTE — Progress Notes (Signed)
Called patient, pre-visit done over the phone with pt. Patient denies any allergies to eggs or soy. Patient denies any problems with anesthesia/sedation. Patient denies any oxygen use at home. Patient denies taking any diet/weight loss medications or blood thinners. EMMI education assisgned to patient on colonoscopy, this was explained and instructions given to patient. instructions mailed to pt. With suprep coupon.

## 2018-07-04 ENCOUNTER — Encounter: Payer: BC Managed Care – PPO | Admitting: Gastroenterology

## 2018-07-05 ENCOUNTER — Encounter: Payer: BC Managed Care – PPO | Admitting: Gastroenterology

## 2018-07-13 ENCOUNTER — Encounter: Payer: Self-pay | Admitting: Gastroenterology

## 2018-07-27 ENCOUNTER — Encounter: Payer: BC Managed Care – PPO | Admitting: Gastroenterology

## 2018-08-02 ENCOUNTER — Telehealth: Payer: Self-pay | Admitting: *Deleted

## 2018-08-02 NOTE — Telephone Encounter (Signed)
Called patient. No answer, left message for patient to call us back to reschedule colonoscopy with Dr.Armbruster.

## 2018-08-02 NOTE — Telephone Encounter (Signed)
Spoke with patient. Reschedule his colonoscopy for 09/05/2018 at 8 am. Arrive at 7 am. Patient has suprep at home. New instructions mailed to patient-pt is aware. I did explained when to stop eating and drinking and when to drink the preps with the patient.  encouraged patient to call us back with any questions-pt verbalizes understanding.

## 2018-09-02 ENCOUNTER — Telehealth: Payer: Self-pay

## 2018-09-02 ENCOUNTER — Telehealth: Payer: Self-pay | Admitting: Gastroenterology

## 2018-09-02 ENCOUNTER — Ambulatory Visit: Payer: Self-pay

## 2018-09-02 NOTE — Telephone Encounter (Signed)
After talking with MD, he decided to go ahead and add on the EGD to the patients procedure time this Monday. I informed the patient and answered several questions. Patient acted like he didn't remember his instructions so we went over them again. I advised him to go back and read his previsit instructions today so he would not miss anything.

## 2018-09-02 NOTE — Telephone Encounter (Signed)
Patient called said that he would like to have a endo also with his procedure on Monday 09-05-18

## 2018-09-02 NOTE — Telephone Encounter (Signed)
Covid-19 screening questions  Have you traveled in the last 14 days? No. If yes where?  Do you now or have you had a fever in the last 14 days? No.  Do you have any respiratory symptoms of shortness of breath or cough now or in the last 14 days? No.  Do you have any family members or close contacts with diagnosed or suspected Covid-19 in the past 14 days? No.  Have you been tested for Covid-19 and found to be positive? No.       

## 2018-09-02 NOTE — Telephone Encounter (Signed)
Patient experienced some chest pain a while back and went to the cardiologist. They told him it was not heart related  but possibly reflux. He want to change his colon appt into a EGD/Colon. I explained that you would need to evaluate him before we could add on another procedure. I asked him if he wanted to cancel his colonoscopy so that he could see you and then we could reschedule for a double procedure. I told him I would let you know, he wants to know what you say before he agrees.

## 2018-09-02 NOTE — Telephone Encounter (Signed)
Patient called and says he's been having chest pain to the left side for the past couple of days. He says he can feel it at a 10 and sharp when he's bending or turning, but at rest no pain, but he can feel it when he takes a deep breath. He says his left shoulder is sore, but that's an ongoing issue. 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 He says he's been having nausea as well, no other symptoms. I called the office and spoke to [place], [facility] who asks to speak to the patient, the call was connected successfully.   Answer Assessment - Initial Assessment Questions 1. LOCATION: "Where does it hurt?"       Mainly left side 2. RADIATION: "Does the pain go anywhere else?" (e.g., into neck, jaw, arms, back)     Left shoulder  3. ONSET: "When did the chest pain begin?" (Minutes, hours or days)      Couple of days ago 4. PATTERN "Does the pain come and go, or has it been constant since it started?"  "Does it get worse with exertion?"      Come and go, worse with exertion (bending and turning it hurts) 5. DURATION: "How long does it last" (e.g., seconds, minutes, hours)     Not long 6. SEVERITY: "How bad is the pain?"  (e.g., Scale 1-10; mild, moderate, or severe)    - MILD (1-3): doesn't interfere with normal activities     - MODERATE (4-7): interferes with normal activities or awakens from sleep    - SEVERE (8-10): excruciating pain, unable to do any normal activities       10 when bending or twisting; sitting not really pain, but pressure on the left side 7. CARDIAC RISK FACTORS: "Do you have any history of heart problems or risk factors for heart disease?" (e.g., prior heart attack, angina; high blood pressure, diabetes, being overweight, high cholesterol, smoking, or strong family history of heart disease)     Yes, diabetes and over weight 8. PULMONARY RISK FACTORS: "Do you have any history of lung disease?"  (e.g., blood clots in lung, asthma, emphysema, birth control pills)     No 9. CAUSE: "What do  you think is causing the chest pain?"     I don't know 10. OTHER SYMPTOMS: "Do you have any other symptoms?" (e.g., dizziness, nausea, vomiting, sweating, fever, difficulty breathing, cough)    Nausea 11. PREGNANCY: "Is there any chance you are pregnant?" "When was your last menstrual period?"      N/A  Protocols used: CHEST PAIN-A-AH

## 2018-09-02 NOTE — Telephone Encounter (Signed)
Thanks for the update, looks like he takes omeprazole chronically. If he has not had an EGD and has chronic reflux I think okay to add on an EGD if he has not had one before. I'm okay to add it on to his block on the [place] Monday, should not add too much extra time, I think we can accommodate it.Marland Kitchen He is calling about this too late to cancel his colonoscopy otherwise, would be charged a late fee

## 2018-09-03 ENCOUNTER — Other Ambulatory Visit: Payer: Self-pay | Admitting: Family Medicine

## 2018-09-05 ENCOUNTER — Ambulatory Visit (AMBULATORY_SURGERY_CENTER): Payer: BC Managed Care – PPO | Admitting: Gastroenterology

## 2018-09-05 ENCOUNTER — Encounter: Payer: BC Managed Care – PPO | Admitting: Gastroenterology

## 2018-09-05 ENCOUNTER — Encounter: Payer: Self-pay | Admitting: Gastroenterology

## 2018-09-05 ENCOUNTER — Other Ambulatory Visit: Payer: Self-pay

## 2018-09-05 VITALS — BP 121/82 | HR 79 | Temp 98.5°F | Resp 16

## 2018-09-05 DIAGNOSIS — K317 Polyp of stomach and duodenum: Secondary | ICD-10-CM

## 2018-09-05 DIAGNOSIS — K2980 Duodenitis without bleeding: Secondary | ICD-10-CM

## 2018-09-05 DIAGNOSIS — Z1211 Encounter for screening for malignant neoplasm of colon: Secondary | ICD-10-CM | POA: Diagnosis present

## 2018-09-05 DIAGNOSIS — K219 Gastro-esophageal reflux disease without esophagitis: Secondary | ICD-10-CM | POA: Diagnosis not present

## 2018-09-05 DIAGNOSIS — K2950 Unspecified chronic gastritis without bleeding: Secondary | ICD-10-CM

## 2018-09-05 DIAGNOSIS — K2970 Gastritis, unspecified, without bleeding: Secondary | ICD-10-CM

## 2018-09-05 DIAGNOSIS — K3189 Other diseases of stomach and duodenum: Secondary | ICD-10-CM | POA: Diagnosis not present

## 2018-09-05 DIAGNOSIS — R0789 Other chest pain: Secondary | ICD-10-CM

## 2018-09-05 MED ORDER — SODIUM CHLORIDE 0.9 % IV SOLN: 500.0000 mL | Freq: Once | INTRAVENOUS | Status: DC

## 2018-09-05 MED ORDER — OMEPRAZOLE 40 MG PO CPDR: 40.0000 mg | DELAYED_RELEASE_CAPSULE | Freq: Two times a day (BID) | ORAL | 1 refills | Status: DC

## 2018-09-05 NOTE — Op Note (Signed)
[place] Patient Name: Austin Valencia Date: 09/05/2018 8:39 AM MRN: [id number] Endoscopist: Remo Lipps P. Havery Moros , MD Age: 53 Referring MD:  Date of Birth: 07-01-1965 Gender: Male Account #: [id number] Valencia:                Upper GI endoscopy Indications:              Follow-up of gastro-esophageal reflux disease,                            Chest pain (non cardiac), improved on omeprazole                            20mg  daily Medicines:                Monitored Anesthesia Care Valencia:                Pre-Anesthesia Assessment:                           - Prior to the Valencia, a History and Physical                            was performed, and patient medications and                            allergies were reviewed. The patient's tolerance of                            previous anesthesia was also reviewed. The risks                            and benefits of the Valencia and the sedation                            options and risks were discussed with the patient.                            All questions were answered, and informed consent                            was obtained. Prior Anticoagulants: The patient has                            taken no previous anticoagulant or antiplatelet                            agents. ASA Grade Assessment: II - A patient with                            mild systemic disease. After reviewing the risks                            and benefits, the patient was deemed in  satisfactory condition to undergo the Valencia.                           After obtaining informed consent, the endoscope was                            passed under direct vision. Throughout the                            Valencia, the patient's blood pressure, pulse, and                            oxygen saturations were monitored continuously. The                            Model GIF-[id number]) scope was  introduced                            through the mouth, and advanced to the second part                            of duodenum. The upper GI endoscopy was                            accomplished without difficulty. The patient                            tolerated the Valencia well. Scope In: Scope Out: Findings:                 Esophagogastric landmarks were identified: the                            Z-line was found at 42 cm, the gastroesophageal                            junction was found at 42 cm and the upper extent of                            the gastric folds was found at 42 cm from the                            incisors.                           The exam of the esophagus was otherwise normal.                           Patchy severe inflammation characterized by                            adherent blood, congestion (edema), erythema with                            friability, and nodularity was found  in the gastric                            antrum. Biopsies were taken with a cold forceps for                            histology and to rule out H Pylori.                           A few medium benign appearing sessile polyps were                            found in the gastric body. Biopsies were taken with                            a cold forceps for histology.                           The exam of the stomach was otherwise normal.                           Patchy moderate inflammation characterized by                            erythema and friability was found in the duodenal                            bulb, no focal ulceration was noted, with prominent                            vascular areas. There was oozing from the                            friability with contact with the scope in the bulb                            and duodenal sweep. Nodularity was also noted in                            the bulb, more so consistent with ectopic gastric                             mucosa. Biopsies were taken with a cold forceps for                            histology.                           The exam of the duodenum was otherwise normal. Complications:            No immediate complications. Estimated blood loss:                            Minimal. Estimated Blood Loss:  Estimated blood loss was minimal. Impression:               - Esophagogastric landmarks identified.                           - Normal esophagus                           - Severe gastritis as outlined above. Biopsied.                           - A few benign appearing gastric polyps. Biopsied.                           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 Biopsied. Recommendation:           - Patient has a contact number available for                            emergencies. The signs and symptoms of potential                            delayed complications were discussed with the                            patient. Return to normal activities tomorrow.                            Written discharge instructions were provided to the                            patient.                           - Resume previous diet.                           - Continue present medications.                           - Increase omeprazole to 40mg  twice daily                           - Please avoid all NSAIDs, use tylenol PRN for pains                           - Await pathology results. Remo Lipps P. Demitrious Mccannon, MD 09/05/2018 9:25:30 AM This report has been signed electronically.

## 2018-09-05 NOTE — Progress Notes (Signed)
Pt's states no medical or surgical changes since previsit or office visit. 

## 2018-09-05 NOTE — Progress Notes (Signed)
Called to room to assist during endoscopic procedure.  Patient ID and intended procedure confirmed with present staff. Received instructions for my participation in the procedure from the performing physician.  

## 2018-09-05 NOTE — Op Note (Signed)
[place] Patient Name: Austin Valencia Procedure Date: 09/05/2018 8:39 AM MRN: [id number] Endoscopist: Remo Lipps P. Havery Moros , MD Age: 53 Referring MD:  Date of Birth: 1965/04/27 Gender: Male Account #: [id number] Procedure:                Colonoscopy Indications:              Screening for colorectal malignant neoplasm, This                            is the patient's first colonoscopy Medicines:                Monitored Anesthesia Care Procedure:                Pre-Anesthesia Assessment:                           - Prior to the procedure, a History and Physical                            was performed, and patient medications and                            allergies were reviewed. The patient's tolerance of                            previous anesthesia was also reviewed. The risks                            and benefits of the procedure and the sedation                            options and risks were discussed with the patient.                            All questions were answered, and informed consent                            was obtained. Prior Anticoagulants: The patient has                            taken no previous anticoagulant or antiplatelet                            agents. ASA Grade Assessment: II - A patient with                            mild systemic disease. After reviewing the risks                            and benefits, the patient was deemed in                            satisfactory condition to undergo the procedure.  After obtaining informed consent, the colonoscope                            was passed under direct vision. Throughout the                            procedure, the patient's blood pressure, pulse, and                            oxygen saturations were monitored continuously. The                            Colonoscope was introduced through the anus and                            advanced to the the  cecum, identified by                            appendiceal orifice and ileocecal valve. The                            colonoscopy was performed without difficulty. The                            patient tolerated the procedure well. The quality                            of the bowel preparation was good. The ileocecal                            valve, appendiceal orifice, and rectum were                            photographed. Scope In: 8:53:36 AM Scope Out: 9:14:44 AM Scope Withdrawal Time: 0 hours 15 minutes 52 seconds  Total Procedure Duration: 0 hours 21 minutes 8 seconds  Findings:                 The perianal and digital rectal examinations were                            normal.                           A few small-mouthed diverticula were found in the                            sigmoid colon and ascending colon.                           Internal hemorrhoids were found during retroflexion.                           The exam was otherwise without abnormality. No  polyps. Complications:            No immediate complications. Estimated blood loss:                            None. Estimated Blood Loss:     Estimated blood loss: none. Impression:               - Diverticulosis in the sigmoid colon and in the                            ascending colon.                           - Internal hemorrhoids.                           - The examination was otherwise normal.                           - No polyps Recommendation:           - Patient has a contact number available for                            emergencies. The signs and symptoms of potential                            delayed complications were discussed with the                            patient. Return to normal activities tomorrow.                            Written discharge instructions were provided to the                            patient.                           - Resume previous  diet.                           - Continue present medications.                           - Repeat colonoscopy in 10 years for screening                            purposes. Remo Lipps P. Armbruster, MD 09/05/2018 9:17:56 AM This report has been signed electronically.

## 2018-09-05 NOTE — Patient Instructions (Signed)
YOU HAD AN ENDOSCOPIC PROCEDURE TODAY AT THE [facility]:   Refer to the procedure report that was given to you for any specific questions about what was found during the examination.  If the procedure report does not answer your questions, please call your gastroenterologist to clarify.  If you requested that your care partner not be given the details of your procedure findings, then the procedure report has been included in a sealed envelope for you to review at your convenience later.  YOU SHOULD EXPECT: Some feelings of bloating in the abdomen. Passage of more gas than usual.  Walking can help get rid of the air that was put into your GI tract during the procedure and reduce the bloating. If you had a lower endoscopy (such as a colonoscopy or flexible sigmoidoscopy) you may notice spotting of blood in your stool or on the toilet paper. If you underwent a bowel prep for your procedure, you may not have a normal bowel movement for a few days.  Please Note:  You might notice some irritation and congestion in your nose or some drainage.  This is from the oxygen used during your procedure.  There is no need for concern and it should clear up in a day or so.  SYMPTOMS TO REPORT IMMEDIATELY:   Following lower endoscopy (colonoscopy or flexible sigmoidoscopy):  Excessive amounts of blood in the stool  Significant tenderness or worsening of abdominal pains  Swelling of the abdomen that is new, acute  Fever of 100F or higher   Following upper endoscopy (EGD)  Vomiting of blood or coffee ground material  New chest pain or pain under the shoulder blades  Painful or persistently difficult swallowing  New shortness of breath  Fever of 100F or higher  Black, tarry-looking stools  For urgent or emergent issues, a gastroenterologist can be reached at any hour by calling [phone].   DIET:  We do recommend a small meal at first, but then you may proceed to your regular diet.  Drink  plenty of fluids but you should avoid alcoholic beverages for 24 hours.  ACTIVITY:  You should plan to take it easy for the rest of today and you should NOT DRIVE or use heavy machinery until tomorrow (because of the sedation medicines used during the test).    FOLLOW UP: Our staff will call the number listed on your records 48-72 hours following your procedure to check on you and address any questions or concerns that you may have regarding the information given to you following your procedure. If we do not reach you, we will leave a message.  We will attempt to reach you two times.  During this call, we will ask if you have developed any symptoms of COVID 19. If you develop any symptoms (ie: fever, flu-like symptoms, shortness of breath, cough etc.) before then, please call [phone].  If you test positive for Covid 19 in the 2 weeks post procedure, please call and report this information to us.    If any biopsies were taken you will be contacted by phone or by letter within the next 1-3 weeks.  Please call us at [phone] if you have not heard about the biopsies in 3 weeks.    SIGNATURES/CONFIDENTIALITY: You and/or your care partner have signed paperwork which will be entered into your electronic medical record.  These signatures attest to the fact that that the information above on your After Visit Summary has been reviewed and is   understood.  Full responsibility of the confidentiality of this discharge information lies with you and/or your care-partner. 

## 2018-09-05 NOTE — Progress Notes (Signed)
Report given to PACU, vss 

## 2018-09-07 ENCOUNTER — Telehealth: Payer: Self-pay | Admitting: *Deleted

## 2018-09-07 NOTE — Telephone Encounter (Signed)
1. Have you developed a fever since your procedure? no  2.   Have you had an respiratory symptoms (SOB or cough) since your procedure? no  3.   Have you tested positive for COVID 19 since your procedure no  4.   Have you had any family members/close contacts diagnosed with the COVID 19 since your procedure?  no   If yes to any of these questions please route to Joylene John, RN and Alphonsa Gin, Therapist, sports.  Follow up Call-  Call back number 09/05/2018  Post procedure Call Back phone  # [phone]  Permission to leave phone message Yes  Some recent data might be hidden     Patient questions:  Do you have a fever, pain , or abdominal swelling? No. Pain Score  0 *  Have you tolerated food without any problems? Yes.    Have you been able to return to your normal activities? Yes.    Do you have any questions about your discharge instructions: Diet   No. Medications  No. Follow up visit  No.  Do you have questions or concerns about your Care? No.  Actions: * If pain score is 4 or above: No action needed, pain <4.

## 2018-09-14 ENCOUNTER — Encounter: Payer: Self-pay | Admitting: Gastroenterology

## 2018-09-26 ENCOUNTER — Ambulatory Visit: Payer: BC Managed Care – PPO | Admitting: Family Medicine

## 2018-09-30 ENCOUNTER — Encounter: Payer: BC Managed Care – PPO | Admitting: Gastroenterology

## 2018-10-07 LAB — HM DIABETES EYE EXAM

## 2018-12-14 ENCOUNTER — Telehealth: Payer: Self-pay | Admitting: Family

## 2018-12-14 NOTE — Telephone Encounter (Signed)
Patient came by the office requesting to re-establish care here at [facility]. He was previously a patient of Terri Piedra and transferred to [name] at [place] when [facility]ering left. Would you be willing to see him?

## 2018-12-14 NOTE — Telephone Encounter (Signed)
It's fine- please schedule as new patient/ not TOC though.

## 2018-12-15 NOTE — Telephone Encounter (Signed)
Appointment scheduled.

## 2018-12-23 ENCOUNTER — Other Ambulatory Visit: Payer: Self-pay

## 2018-12-23 ENCOUNTER — Ambulatory Visit (INDEPENDENT_AMBULATORY_CARE_PROVIDER_SITE_OTHER): Payer: BC Managed Care – PPO | Admitting: Family

## 2018-12-23 ENCOUNTER — Encounter: Payer: Self-pay | Admitting: Family

## 2018-12-23 ENCOUNTER — Other Ambulatory Visit (INDEPENDENT_AMBULATORY_CARE_PROVIDER_SITE_OTHER): Payer: BC Managed Care – PPO

## 2018-12-23 VITALS — BP 100/68 | HR 76 | Temp 98.2°F | Ht 67.0 in | Wt 220.0 lb

## 2018-12-23 DIAGNOSIS — E781 Pure hyperglyceridemia: Secondary | ICD-10-CM

## 2018-12-23 DIAGNOSIS — K219 Gastro-esophageal reflux disease without esophagitis: Secondary | ICD-10-CM | POA: Diagnosis not present

## 2018-12-23 DIAGNOSIS — N183 Chronic kidney disease, stage 3 unspecified: Secondary | ICD-10-CM | POA: Diagnosis not present

## 2018-12-23 DIAGNOSIS — R972 Elevated prostate specific antigen [PSA]: Secondary | ICD-10-CM | POA: Diagnosis not present

## 2018-12-23 DIAGNOSIS — Z23 Encounter for immunization: Secondary | ICD-10-CM | POA: Diagnosis not present

## 2018-12-23 DIAGNOSIS — E538 Deficiency of other specified B group vitamins: Secondary | ICD-10-CM | POA: Diagnosis not present

## 2018-12-23 DIAGNOSIS — E1122 Type 2 diabetes mellitus with diabetic chronic kidney disease: Secondary | ICD-10-CM

## 2018-12-23 DIAGNOSIS — I10 Essential (primary) hypertension: Secondary | ICD-10-CM

## 2018-12-23 LAB — CBC WITH DIFFERENTIAL/PLATELET
Basophils Absolute: 0 10*3/uL (ref 0.0–0.1)
Basophils Relative: 0.9 % (ref 0.0–3.0)
Eosinophils Absolute: 0.1 10*3/uL (ref 0.0–0.7)
Eosinophils Relative: 1.7 % (ref 0.0–5.0)
HCT: 40.5 % (ref 39.0–52.0)
Hemoglobin: 13.8 g/dL (ref 13.0–17.0)
Lymphocytes Relative: 26.7 % (ref 12.0–46.0)
Lymphs Abs: 1.4 10*3/uL (ref 0.7–4.0)
MCHC: 34.1 g/dL (ref 30.0–36.0)
MCV: 95.8 fl (ref 78.0–100.0)
Monocytes Absolute: 0.3 10*3/uL (ref 0.1–1.0)
Monocytes Relative: 5.8 % (ref 3.0–12.0)
Neutro Abs: 3.5 10*3/uL (ref 1.4–7.7)
Neutrophils Relative %: 64.9 % (ref 43.0–77.0)
Platelets: 260 10*3/uL (ref 150.0–400.0)
RBC: 4.22 Mil/uL (ref 4.22–5.81)
RDW: 13.7 % (ref 11.5–15.5)
WBC: 5.3 10*3/uL (ref 4.0–10.5)

## 2018-12-23 LAB — COMPREHENSIVE METABOLIC PANEL
ALT: 14 U/L (ref 0–53)
AST: 13 U/L (ref 0–37)
Albumin: 4.7 g/dL (ref 3.5–5.2)
Alkaline Phosphatase: 44 U/L (ref 39–117)
BUN: 12 mg/dL (ref 6–23)
CO2: 27 mEq/L (ref 19–32)
Calcium: 10 mg/dL (ref 8.4–10.5)
Chloride: 104 mEq/L (ref 96–112)
Creatinine, Ser: 1.49 mg/dL (ref 0.40–1.50)
GFR: 60.15 mL/min (ref >60.00)
Glucose, Bld: 135 mg/dL — ABNORMAL HIGH (ref 70–99)
Potassium: 4.2 mEq/L (ref 3.5–5.1)
Sodium: 140 mEq/L (ref 135–145)
Total Bilirubin: 0.4 mg/dL (ref 0.2–1.2)
Total Protein: 6.9 g/dL (ref 6.0–8.3)

## 2018-12-23 LAB — LIPID PANEL
Cholesterol: 92 mg/dL (ref 0–200)
HDL: 29.3 mg/dL — ABNORMAL LOW (ref >39.00)
LDL Cholesterol: 35 mg/dL (ref 0–99)
NonHDL: 62.81
Total CHOL/HDL Ratio: 3
Triglycerides: 137 mg/dL (ref 0.0–149.0)
VLDL: 27.4 mg/dL (ref 0.0–40.0)

## 2018-12-23 LAB — HEMOGLOBIN A1C: Hgb A1c MFr Bld: 6.7 % — ABNORMAL HIGH (ref 4.6–6.5)

## 2018-12-23 LAB — VITAMIN B12: Vitamin B-12: 163 pg/mL — ABNORMAL LOW (ref 211–911)

## 2018-12-23 LAB — PSA: PSA: 3.09 ng/mL (ref 0.10–4.00)

## 2018-12-23 NOTE — Progress Notes (Signed)
Austin Vertz. is a 53 y.o. male with the following history as recorded in Epic[employer]:  Patient Active Problem List   Diagnosis Date Noted  . Vitamin B12 deficiency 12/24/2017  . Elevated PSA 12/24/2017  . Obesity (BMI 30-39.9) 04/21/2017  . Left inguinal hernia 03/12/2016  . Type 2 diabetes mellitus with stage 3 chronic kidney disease, without long-term current use of insulin ([place]) 06/22/2015  . 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 GERD 03/28/2009  . Hyperlipidemia 02/25/2009  . DEPRESSION 02/25/2009  . Essential hypertension 02/25/2009  . BPH (benign prostatic hyperplasia) 02/25/2009    Current Outpatient Medications  Medication Sig Dispense Refill  . alfuzosin (UROXATRAL) 10 MG 24 hr tablet Take 1 tablet (10 mg total) by mouth daily with breakfast. 90 tablet 3  . amLODipine (NORVASC) 5 MG tablet Take 1 tablet (5 mg total) by mouth daily. 90 tablet 3  . Blood Gluc Meter Disp-Strips (BLOOD GLUCOSE METER DISPOSABLE) DEVI Please given strips of type to work w/ pt's meter. Needs to check tid due to medication change, hyperglycemia 100 each 11  . Dulaglutide (TRULICITY) 1.5 ST/4.1DQ SOPN Inject into the skin. Take weekly as directed    . fenofibrate (TRICOR) 145 MG tablet Take 1 tablet (145 mg total) by mouth daily. 90 tablet 1  . Insulin Pen Needle (PEN NEEDLES 31GX5/16") 31G X 8 MM MISC 1 pen needle per lantus injection 50 each 0  . metFORMIN (GLUCOPHAGE) 1000 MG tablet TAKE 1 TAB po bid 180 tablet 2  . Multiple Vitamin (MULTIVITAMIN) tablet Take 1 tablet by mouth daily.    Marland Kitchen omeprazole (PRILOSEC) 40 MG capsule Take 1 capsule (40 mg total) by mouth 2 (two) times a day. 180 capsule 1   No current facility-administered medications for this visit.     Allergies: Losartan and Ace inhibitors  Past Medical History:  Diagnosis Date  . Diabetes mellitus without complication ([place]) 22/2979  . GERD (gastroesophageal reflux disease)   . Headache(784.0)   . Hyperlipidemia   . Hypertension      Past Surgical History:  Procedure Laterality Date  . HERNIA REPAIR     umbilical child  . LIPOMA EXCISION Right 11/29/2013   Procedure: EXCISION LIPOMA RIGHT AXILLA;  Surgeon: Autumn Messing III, MD;  Location: [facility];  Service: General;  Laterality: Right;    Family History  Problem Relation Age of Onset  . Cancer Father        lung  . Diabetes Father   . Hypertension Father   . Hyperlipidemia Mother   . Hypertension Mother   . Colon cancer Paternal Uncle        68's  . Colon polyps Neg Hx   . Esophageal cancer Neg Hx   . Rectal cancer Neg Hx   . Stomach cancer Neg Hx     Social History   Tobacco Use  . Smoking status: Never Smoker  . Smokeless tobacco: Never Used  Substance Use Topics  . Alcohol use: No    Alcohol/week: 0.0 standard drinks    Subjective:  Patient presents to re-establish care today; history of Type 2 Diabetes- currently on Metformin and Trulicity; not currently on statin- is only taking Tricor; Notes blood sugar was 111 ( fasting). PSA was noted to be elevated earlier this year- was referred to urology; difficult historian- it sounds like he was supposed to schedule for biopsy; not sure why this didn't happen.  Notes that he sees Dr. Terrence Dupont occasionally for "cardiac needs"  as well; states he thinks Harwani has him on another medication as well.      Objective:  Vitals:   12/23/18 0903  BP: 100/68  Pulse: 76  Temp: 98.2 F (36.8 C)  TempSrc: Oral  SpO2: 96%  Weight: 220 lb (99.8 kg)  Height: '5\' 7"'$  (1.702 m)    General: Well developed, well nourished, in no acute distress  Skin : Warm and dry.  Head: Normocephalic and atraumatic  Eyes: Sclera and conjunctiva clear; pupils round and reactive to light; extraocular movements intact  Ears: External normal; canals clear; tympanic membranes normal  Oropharynx: Pink, supple. No suspicious lesions  Neck: Supple without thyromegaly, adenopathy  Lungs: Respirations unlabored; clear to  auscultation bilaterally without wheeze, rales, rhonchi  CVS exam: normal rate and regular rhythm.  Abdomen: Soft; nontender; nondistended; normoactive bowel sounds; no masses or hepatosplenomegaly  Musculoskeletal: No deformities; no active joint inflammation  Extremities: No edema, cyanosis, clubbing  Vessels: Symmetric bilaterally  Neurologic: Alert and oriented; speech intact; face symmetrical; moves all extremities well; CNII-XII intact without focal deficit   Assessment:  1. Type 2 diabetes mellitus with stage 3 chronic kidney disease, without long-term current use of insulin, unspecified whether stage 3a or 3b CKD ([place])   2. Need for influenza vaccination   3. Gastroesophageal reflux disease without esophagitis   4. Elevated PSA   5. Low vitamin B12 level   6. Essential hypertension   7. Pure hypertriglyceridemia     Plan:  1. Need to update labs to determine control; will update labs today; follow-up in 1 month; 2. Flu shot given; 3. Increase Omeprazole 40 mg bid; needs to see GI in 1 month- referral updated. 4. Re-check PSA; need to refer to urology; 5. Check B12 level today; 6. Stable; continue same medication; 7. Continue Tricor; patient has been prescribed Atoravastatin in the past- not sure why he is not taking; will most likely need to re-start.   No follow-ups on file.  Orders Placed This Encounter  Procedures  . Flu Vaccine QUAD 36+ mos IM  . CBC w/Diff    Standing Status:   Future    Number of Occurrences:   1    Standing Expiration Date:   12/23/2019  . Comp Met (CMET)    Standing Status:   Future    Number of Occurrences:   1    Standing Expiration Date:   12/23/2019  . Lipid panel    Standing Status:   Future    Number of Occurrences:   1    Standing Expiration Date:   12/23/2019  . HgB A1c    Standing Status:   Future    Number of Occurrences:   1    Standing Expiration Date:   12/23/2019  . PSA    Standing Status:   Future    Number of Occurrences:    1    Standing Expiration Date:   12/23/2019  . B12    Standing Status:   Future    Number of Occurrences:   1    Standing Expiration Date:   12/23/2019  . Ambulatory referral to Gastroenterology    Referral Priority:   Routine    Referral Type:   Consultation    Referral Reason:   Specialty Services Required    Referred to Provider:   Yetta Flock, MD    Number of Visits Requested:   1    Requested Prescriptions    No prescriptions requested or  ordered in this encounter     

## 2018-12-28 ENCOUNTER — Ambulatory Visit (INDEPENDENT_AMBULATORY_CARE_PROVIDER_SITE_OTHER): Payer: BC Managed Care – PPO

## 2018-12-28 DIAGNOSIS — E538 Deficiency of other specified B group vitamins: Secondary | ICD-10-CM

## 2018-12-28 MED ORDER — CYANOCOBALAMIN 1000 MCG/ML IJ SOLN
1000.0000 ug | Freq: Once | INTRAMUSCULAR | Status: AC
  Administered 2018-12-28: 1000 ug via INTRAMUSCULAR

## 2019-01-04 ENCOUNTER — Ambulatory Visit (INDEPENDENT_AMBULATORY_CARE_PROVIDER_SITE_OTHER): Payer: BC Managed Care – PPO

## 2019-01-04 DIAGNOSIS — E538 Deficiency of other specified B group vitamins: Secondary | ICD-10-CM | POA: Diagnosis not present

## 2019-01-04 MED ORDER — CYANOCOBALAMIN 1000 MCG/ML IJ SOLN
1000.0000 ug | Freq: Once | INTRAMUSCULAR | Status: AC
  Administered 2019-01-04: 1000 ug via INTRAMUSCULAR

## 2019-01-06 NOTE — Progress Notes (Signed)
Agree with treatment 

## 2019-01-11 ENCOUNTER — Ambulatory Visit (INDEPENDENT_AMBULATORY_CARE_PROVIDER_SITE_OTHER): Payer: BC Managed Care – PPO

## 2019-01-11 DIAGNOSIS — E538 Deficiency of other specified B group vitamins: Secondary | ICD-10-CM | POA: Diagnosis not present

## 2019-01-11 MED ORDER — CYANOCOBALAMIN 1000 MCG/ML IJ SOLN
1000.0000 ug | Freq: Once | INTRAMUSCULAR | Status: AC
  Administered 2019-01-11: 1000 ug via INTRAMUSCULAR

## 2019-01-13 NOTE — Progress Notes (Signed)
Agree with treatment 

## 2019-01-14 ENCOUNTER — Encounter (HOSPITAL_COMMUNITY): Payer: Self-pay

## 2019-01-14 ENCOUNTER — Other Ambulatory Visit: Payer: Self-pay

## 2019-01-14 ENCOUNTER — Ambulatory Visit (HOSPITAL_COMMUNITY)
Admission: EM | Admit: 2019-01-14 | Discharge: 2019-01-14 | Disposition: A | Discharge status: Home / Self Care | Payer: BC Managed Care – PPO

## 2019-01-14 DIAGNOSIS — M545 Low back pain, unspecified: Secondary | ICD-10-CM

## 2019-01-14 DIAGNOSIS — R1032 Left lower quadrant pain: Secondary | ICD-10-CM

## 2019-01-14 DIAGNOSIS — R109 Unspecified abdominal pain: Secondary | ICD-10-CM

## 2019-01-14 DIAGNOSIS — E119 Type 2 diabetes mellitus without complications: Secondary | ICD-10-CM

## 2019-01-14 DIAGNOSIS — I10 Essential (primary) hypertension: Secondary | ICD-10-CM

## 2019-01-14 MED ORDER — HYDROCODONE-ACETAMINOPHEN 5-325 MG PO TABS: 1.0000 | ORAL_TABLET | Freq: Four times a day (QID) | ORAL | 0 refills | Status: DC | PRN

## 2019-01-14 NOTE — Discharge Instructions (Addendum)
Your vital signs including your blood pressure, temperature and pulse are very stable.  This can change however especially if your abdominal pain worsens.  Right now it would be important to report to the emergency room if you develop fever, nausea, vomiting, worsening abdominal pain with blood in your stool.  This may be a sign of diverticulitis.  For now, we will manage you as an outpatient but I want to emphasize that you need to contact your PCP as soon as possible Monday morning to see if they can order an outpatient abdominal CT scan to evaluate whether or not you have a significant problem like diverticulitis.

## 2019-01-14 NOTE — ED Triage Notes (Signed)
Pt states he is having left sided abdominal pain x 4 days. Pt states having left sided back pain x 4 days, worse if he lay down.

## 2019-01-14 NOTE — ED Provider Notes (Signed)
MRN: [id number] DOB: Jul 10, 1965  Subjective:   Austin Graser. is a 53 y.o. male presenting for 4-day history of moderate to severe progressively worsening left lower abdominal pain, left flank pain going to the left lower side of his back.  Pain is mostly constant and moderate but when he presses into his belly becomes severe.  Tried 81mg  aspirin without any relief.  Patient denies history of diverticulitis.  He has type 2 diabetes that is well controlled.  He also takes blood pressure medications.  Admits that he is not had a good diet better part of his life.  Denies trouble with constipation, fever, nausea, vomiting.  Last bowel movement was last night and was normal.  Denies bloody stools.  Patient admits that he does not hydrate well.  No current facility-administered medications for this encounter.   Current Outpatient Medications:  .  metoprolol tartrate (LOPRESSOR) 25 MG tablet, Take 25 mg by mouth 2 (two) times daily., Disp: , Rfl:  .  alfuzosin (UROXATRAL) 10 MG 24 hr tablet, Take 1 tablet (10 mg total) by mouth daily with breakfast., Disp: 90 tablet, Rfl: 3 .  amLODipine (NORVASC) 5 MG tablet, Take 1 tablet (5 mg total) by mouth daily., Disp: 90 tablet, Rfl: 3 .  Blood Gluc Meter Disp-Strips (BLOOD GLUCOSE METER DISPOSABLE) DEVI, Please given strips of type to work w/ pt's meter. Needs to check tid due to medication change, hyperglycemia, Disp: 100 each, Rfl: 11 .  Dulaglutide (TRULICITY) 1.5 0000000 SOPN, Inject into the skin. Take weekly as directed, Disp: , Rfl:  .  fenofibrate (TRICOR) 145 MG tablet, Take 1 tablet (145 mg total) by mouth daily., Disp: 90 tablet, Rfl: 1 .  Insulin Pen Needle (PEN NEEDLES 31GX5/16") 31G X 8 MM MISC, 1 pen needle per lantus injection, Disp: 50 each, Rfl: 0 .  metFORMIN (GLUCOPHAGE) 1000 MG tablet, TAKE 1 TAB po bid, Disp: 180 tablet, Rfl: 2 .  Multiple Vitamin (MULTIVITAMIN) tablet, Take 1 tablet by mouth daily., Disp: , Rfl:  .  omeprazole  (PRILOSEC) 40 MG capsule, Take 1 capsule (40 mg total) by mouth 2 (two) times a day., Disp: 180 capsule, Rfl: 1   Allergies  Allergen Reactions  . 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 Ace Inhibitors     See 05/23/14 angioedema with Losartan    Past Medical History:  Diagnosis Date  . Diabetes mellitus without complication ([place]) 0000000  . GERD (gastroesophageal reflux disease)   . Headache(784.0)   . Hyperlipidemia   . Hypertension      Past Surgical History:  Procedure Laterality Date  . HERNIA REPAIR     umbilical child  . LIPOMA EXCISION Right 11/29/2013   Procedure: EXCISION LIPOMA RIGHT AXILLA;  Surgeon: Autumn Messing III, MD;  Location: [facility];  Service: General;  Laterality: Right;    Social History   Tobacco Use  . Smoking status: Never Smoker  . Smokeless tobacco: Never Used  Substance Use Topics  . Alcohol use: No    Alcohol/week: 0.0 standard drinks  . Drug use: No    Family History  Problem Relation Age of Onset  . Cancer Father        lung  . Diabetes Father   . Hypertension Father   . Hyperlipidemia Mother   . Hypertension Mother   . Colon cancer Paternal Uncle        36's  . Colon polyps Neg Hx   . Esophageal  cancer Neg Hx   . Rectal cancer Neg Hx   . Stomach cancer Neg Hx     Social History   Tobacco Use  . Smoking status: Never Smoker  . Smokeless tobacco: Never Used  Substance Use Topics  . Alcohol use: No    Alcohol/week: 0.0 standard drinks  . Drug use: No    Family History  Problem Relation Age of Onset  . Cancer Father        lung  . Diabetes Father   . Hypertension Father   . Hyperlipidemia Mother   . Hypertension Mother   . Colon cancer Paternal Uncle        9's  . Colon polyps Neg Hx   . Esophageal cancer Neg Hx   . Rectal cancer Neg Hx   . Stomach cancer Neg Hx      Review of Systems  Constitutional: Negative for fever and malaise/fatigue.  HENT: Negative for congestion, ear pain, sinus  pain and sore throat.   Eyes: Negative for discharge and redness.  Respiratory: Negative for cough, hemoptysis, shortness of breath and wheezing.   Cardiovascular: Negative for chest pain.  Gastrointestinal: Positive for abdominal pain. Negative for blood in stool, constipation, diarrhea, nausea and vomiting.  Genitourinary: Negative for dysuria, flank pain and hematuria.  Musculoskeletal: Positive for back pain. Negative for myalgias.  Skin: Negative for rash.  Neurological: Negative for dizziness, weakness and headaches.  Psychiatric/Behavioral: Negative for depression and substance abuse.    Objective:   Vitals: BP 119/70 (BP Location: Right Arm)   Pulse 73   Temp 98.3 F (36.8 C) (Oral)   Resp 16   SpO2 97%   Physical Exam Constitutional:      General: He is not in acute distress.    Appearance: Normal appearance. He is well-developed. He is not ill-appearing, toxic-appearing or diaphoretic.  HENT:     Head: Normocephalic and atraumatic.     Right Ear: External ear normal.     Left Ear: External ear normal.     Nose: Nose normal.     Mouth/Throat:     Mouth: Mucous membranes are moist.     Pharynx: Oropharynx is clear.  Eyes:     General: No scleral icterus.    Extraocular Movements: Extraocular movements intact.     Pupils: Pupils are equal, round, and reactive to light.  Cardiovascular:     Rate and Rhythm: Normal rate and regular rhythm.     Heart sounds: Normal heart sounds. No murmur. No friction rub. No gallop.   Pulmonary:     Effort: Pulmonary effort is normal. No respiratory distress.     Breath sounds: Normal breath sounds. No stridor. No wheezing, rhonchi or rales.  Abdominal:     General: Bowel sounds are normal. There is no distension.     Palpations: Abdomen is soft. There is no mass.     Tenderness: There is abdominal tenderness in the left lower quadrant. There is no guarding or rebound.  Skin:    General: Skin is warm and dry.  Neurological:      Mental Status: He is alert and oriented to person, place, and time.  Psychiatric:        Mood and Affect: Mood normal.        Behavior: Behavior normal.        Thought Content: Thought content normal.     Assessment and Plan :   1. Left lower quadrant abdominal pain   2.  Left flank pain   3. Acute left-sided low back pain without sciatica   4. Well controlled diabetes mellitus ([place])   5. Essential hypertension     Discussed differential with patient including possible diverticulitis, renal stone.  Patient has very reassuring vital signs.  He did have pain with deep palpation over left lower quadrant.  Counseled that this needs rule out for diverticulitis but patient was not agreeable to going to the emergency room due to long wait time and possible exposure to other illnesses.  Counseled patient that we can use hydrocodone to control pain and maintain strict ER precautions as discussed.  Also recommended that he reach out to his PCP to try and obtain outpatient CT scan.  Patient contracts for safety. Counseled patient on potential for adverse effects with medications prescribed/recommended today, ER and return-to-clinic precautions discussed, patient verbalized understanding.    Jaynee Eagles, [place] 01/14/19 [id number]

## 2019-01-16 ENCOUNTER — Other Ambulatory Visit: Payer: Self-pay | Admitting: Internal Medicine

## 2019-01-16 ENCOUNTER — Other Ambulatory Visit (INDEPENDENT_AMBULATORY_CARE_PROVIDER_SITE_OTHER): Payer: BC Managed Care – PPO

## 2019-01-16 ENCOUNTER — Ambulatory Visit (INDEPENDENT_AMBULATORY_CARE_PROVIDER_SITE_OTHER): Payer: BC Managed Care – PPO | Admitting: Internal Medicine

## 2019-01-16 ENCOUNTER — Ambulatory Visit (INDEPENDENT_AMBULATORY_CARE_PROVIDER_SITE_OTHER)
Admission: RE | Admit: 2019-01-16 | Discharge: 2019-01-16 | Disposition: A | Discharge status: Home / Self Care | Payer: BC Managed Care – PPO | Source: Ambulatory Visit | Attending: Internal Medicine | Admitting: Internal Medicine

## 2019-01-16 ENCOUNTER — Other Ambulatory Visit: Payer: Self-pay

## 2019-01-16 ENCOUNTER — Encounter: Payer: Self-pay | Admitting: Internal Medicine

## 2019-01-16 DIAGNOSIS — R1032 Left lower quadrant pain: Secondary | ICD-10-CM | POA: Diagnosis not present

## 2019-01-16 LAB — URINALYSIS, ROUTINE W REFLEX MICROSCOPIC
Bilirubin Urine: NEGATIVE
Hgb urine dipstick: NEGATIVE
Ketones, ur: NEGATIVE
Leukocytes,Ua: NEGATIVE
Nitrite: NEGATIVE
RBC / HPF: NONE SEEN (ref 0–?)
Specific Gravity, Urine: 1.02 (ref 1.000–1.030)
Total Protein, Urine: NEGATIVE
Urine Glucose: NEGATIVE
Urobilinogen, UA: 0.2 (ref 0.0–1.0)
pH: 8 (ref 5.0–8.0)

## 2019-01-16 LAB — CBC WITH DIFFERENTIAL/PLATELET
Basophils Absolute: 0 10*3/uL (ref 0.0–0.1)
Basophils Relative: 0.8 % (ref 0.0–3.0)
Eosinophils Absolute: 0.1 10*3/uL (ref 0.0–0.7)
Eosinophils Relative: 1.6 % (ref 0.0–5.0)
HCT: 42.3 % (ref 39.0–52.0)
Hemoglobin: 14.3 g/dL (ref 13.0–17.0)
Lymphocytes Relative: 24.6 % (ref 12.0–46.0)
Lymphs Abs: 1.6 10*3/uL (ref 0.7–4.0)
MCHC: 33.8 g/dL (ref 30.0–36.0)
MCV: 95.1 fl (ref 78.0–100.0)
Monocytes Absolute: 0.5 10*3/uL (ref 0.1–1.0)
Monocytes Relative: 8.1 % (ref 3.0–12.0)
Neutro Abs: 4.1 10*3/uL (ref 1.4–7.7)
Neutrophils Relative %: 64.9 % (ref 43.0–77.0)
Platelets: 248 10*3/uL (ref 150.0–400.0)
RBC: 4.45 Mil/uL (ref 4.22–5.81)
RDW: 13.8 % (ref 11.5–15.5)
WBC: 6.4 10*3/uL (ref 4.0–10.5)

## 2019-01-16 LAB — COMPREHENSIVE METABOLIC PANEL
ALT: 16 U/L (ref 0–53)
AST: 14 U/L (ref 0–37)
Albumin: 4.6 g/dL (ref 3.5–5.2)
Alkaline Phosphatase: 45 U/L (ref 39–117)
BUN: 11 mg/dL (ref 6–23)
CO2: 30 mEq/L (ref 19–32)
Calcium: 9.7 mg/dL (ref 8.4–10.5)
Chloride: 101 mEq/L (ref 96–112)
Creatinine, Ser: 1.48 mg/dL (ref 0.40–1.50)
GFR: 60.61 mL/min (ref >60.00)
Glucose, Bld: 111 mg/dL — ABNORMAL HIGH (ref 70–99)
Potassium: 4.2 mEq/L (ref 3.5–5.1)
Sodium: 137 mEq/L (ref 135–145)
Total Bilirubin: 0.4 mg/dL (ref 0.2–1.2)
Total Protein: 7.1 g/dL (ref 6.0–8.3)

## 2019-01-16 MED ORDER — MELOXICAM 15 MG PO TABS: 15.0000 mg | ORAL_TABLET | Freq: Every day | ORAL | 0 refills | Status: AC

## 2019-01-16 MED ORDER — IOHEXOL 300 MG/ML  SOLN
100.0000 mL | Freq: Once | INTRAMUSCULAR | Status: AC | PRN
  Administered 2019-01-16: 100 mL via INTRAVENOUS

## 2019-01-16 NOTE — Progress Notes (Signed)
Subjective:    Patient ID: Austin Valencia., male    DOB: October 15, 1965, 53 y.o.   MRN: [id number]  HPI The patient is here for an acute visit for left-sided abdominal pain and left back pain.   His left sided abdominal pain started one week ago and he went to [facility] urgent care 2 days ago.  He states the pain is constant and moderate to severe in nature.  When he went to [facility] health urgent care they advised following up with his PCP to consider a CT scan to rule out possible diverticulitis.  The pain is in his left abdomen and left lower back.  His pain is constant.  Nothing makes it better or worse.  The pain is not worse with movement.  He was constipated for a couple of days, which is unusual for him.  His bowel movements are typically regular and daily.  The last couple of days have been better and more normal for him.  He denies any blood in the stool, nausea, fever or chills.  He denies any urinary symptoms, except he did have some difficulty urinating until he increased his fluids and then that corrected itself.  He has not had any blood or pain with urination.  He denies any personal history of kidney stones.    Medications and allergies reviewed with patient and updated if appropriate.  Patient Active Problem List   Diagnosis Date Noted  . Vitamin B12 deficiency 12/24/2017  . Elevated PSA 12/24/2017  . Obesity (BMI 30-39.9) 04/21/2017  . Left inguinal hernia 03/12/2016  . Type 2 diabetes mellitus with stage 3 chronic kidney disease, without long-term current use of insulin ([place]) 06/22/2015  . 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 GERD 03/28/2009  . Hyperlipidemia 02/25/2009  . DEPRESSION 02/25/2009  . Essential hypertension 02/25/2009  . BPH (benign prostatic hyperplasia) 02/25/2009    Current Outpatient Medications on File Prior to Visit  Medication Sig Dispense Refill  . alfuzosin (UROXATRAL) 10 MG 24 hr tablet Take 1 tablet (10 mg total) by mouth daily with breakfast. 90  tablet 3  . amLODipine (NORVASC) 5 MG tablet Take 1 tablet (5 mg total) by mouth daily. 90 tablet 3  . Blood Gluc Meter Disp-Strips (BLOOD GLUCOSE METER DISPOSABLE) DEVI Please given strips of type to work w/ pt's meter. Needs to check tid due to medication change, hyperglycemia 100 each 11  . Dulaglutide (TRULICITY) 1.5 0000000 SOPN Inject into the skin. Take weekly as directed    . fenofibrate (TRICOR) 145 MG tablet Take 1 tablet (145 mg total) by mouth daily. 90 tablet 1  . HYDROcodone-acetaminophen (NORCO/VICODIN) 5-325 MG tablet Take 1 tablet by mouth every 6 (six) hours as needed for severe pain. 15 tablet 0  . Insulin Pen Needle (PEN NEEDLES 31GX5/16") 31G X 8 MM MISC 1 pen needle per lantus injection 50 each 0  . metFORMIN (GLUCOPHAGE) 1000 MG tablet TAKE 1 TAB po bid 180 tablet 2  . metoprolol tartrate (LOPRESSOR) 25 MG tablet Take 25 mg by mouth 2 (two) times daily.    . Multiple Vitamin (MULTIVITAMIN) tablet Take 1 tablet by mouth daily.    Marland Kitchen omeprazole (PRILOSEC) 40 MG capsule Take 1 capsule (40 mg total) by mouth 2 (two) times a day. 180 capsule 1   No current facility-administered medications on file prior to visit.     Past Medical History:  Diagnosis Date  . Diabetes mellitus without complication ([place]) 0000000  . GERD (gastroesophageal  reflux disease)   . Headache(784.0)   . Hyperlipidemia   . Hypertension     Past Surgical History:  Procedure Laterality Date  . HERNIA REPAIR     umbilical child  . LIPOMA EXCISION Right 11/29/2013   Procedure: EXCISION LIPOMA RIGHT AXILLA;  Surgeon: Autumn Messing III, MD;  Location: [place];  Service: General;  Laterality: Right;    Social History   Socioeconomic History  . Marital status: Single    Spouse name: Not on file  . Number of children: 2  . Years of education: 40  . Highest education level: Not on file  Occupational History  . Occupation: Custodian  Social Needs  . Financial resource strain: Not on  file  . Food insecurity    Worry: Not on file    Inability: Not on file  . Transportation needs    Medical: Not on file    Non-medical: Not on file  Tobacco Use  . Smoking status: Never Smoker  . Smokeless tobacco: Never Used  Substance and Sexual Activity  . Alcohol use: No    Alcohol/week: 0.0 standard drinks  . Drug use: No  . Sexual activity: Yes  Lifestyle  . Physical activity    Days per week: Not on file    Minutes per session: Not on file  . Stress: Not on file  Relationships  . Social Herbalist on phone: Not on file    Gets together: Not on file    Attends religious service: Not on file    Active member of club or organization: Not on file    Attends meetings of clubs or organizations: Not on file    Relationship status: Not on file  Other Topics Concern  . Not on file  Social History Narrative   Born in [place], [place] and raised [place], [place]. Currently resides in a house with his mother. No pets. Fun: Go to sporting events, concerts   Denies religious beliefs that would effect health care.     Family History  Problem Relation Age of Onset  . Cancer Father        lung  . Diabetes Father   . Hypertension Father   . Hyperlipidemia Mother   . Hypertension Mother   . Colon cancer Paternal Uncle        47's  . Colon polyps Neg Hx   . Esophageal cancer Neg Hx   . Rectal cancer Neg Hx   . Stomach cancer Neg Hx     Review of Systems  Constitutional: Negative for appetite change, chills and fever.  Respiratory: Negative for cough, shortness of breath and wheezing.   Cardiovascular: Negative for chest pain and palpitations.  Gastrointestinal: Positive for abdominal pain (left side) and constipation. Negative for blood in stool, diarrhea, nausea and vomiting.       GERD controlled  Genitourinary: Positive for difficulty urinating (with abdomianl pain - started drinking more water). Negative for dysuria, frequency and hematuria.  Musculoskeletal:  Positive for back pain.  Neurological: Negative for light-headedness, numbness and headaches.       Objective:   Vitals:   01/16/19 1046  BP: 118/80  Pulse: 73  Temp: 98.3 F (36.8 C)  SpO2: 94%   BP Readings from Last 3 Encounters:  01/16/19 118/80  01/14/19 119/70  12/23/18 100/68   Wt Readings from Last 3 Encounters:  01/16/19 226 lb 3.2 oz (102.6 kg)  12/23/18 220 lb (99.8 kg)  06/21/18  218 lb (98.9 kg)   Body mass index is 35.43 kg/m.   Physical Exam Constitutional:      General: He is not in acute distress.    Appearance: He is well-developed. He is not ill-appearing.  HENT:     Head: Normocephalic and atraumatic.  Cardiovascular:     Rate and Rhythm: Normal rate and regular rhythm.  Pulmonary:     Effort: Pulmonary effort is normal.     Breath sounds: Normal breath sounds.  Abdominal:     General: Bowel sounds are normal.     Palpations: Abdomen is soft.     Tenderness: There is abdominal tenderness in the left lower quadrant. There is no right CVA tenderness, left CVA tenderness, guarding or rebound.  Musculoskeletal:     Comments: No tenderness left lower back with palpation.  No change in pain with movement.  Skin:    General: Skin is warm and dry.  Neurological:     Mental Status: He is alert.            Assessment & Plan:    See Problem List for Assessment and Plan of chronic medical problems.

## 2019-01-16 NOTE — Telephone Encounter (Signed)
Let him know his blood work is normal (kidney and liver tests, blood counts) and his urine is normal.     Ct shows no cause for the pain.  There are some incidental findings:   He has a enlarged prostate, tiny b/l kidney stones that are not causing this pain. He also has b/l groin hernia.  Nothing needs to be done about any of these now. Can review with Mickel Baas.     His pain may be musculoskeletal in nature - we can try meloxicam 15 mg daily with food to see if that helps.  (pending)   Call back if no improvement

## 2019-01-16 NOTE — Patient Instructions (Addendum)
Have blood work done today.    A CT scan was ordered.     We will call you with the results.

## 2019-01-16 NOTE — Assessment & Plan Note (Signed)
Left-sided abdominal pain, left lower back pain Possibly diverticulitis, possible kidney stone, possible musculoskeletal in nature He states the pain is severe 9/10 here, but appears more comfortable than CBC, CMP, urinalysis We will order CT of the abdomen and pelvis to rule out diverticulitis, kidney stone Further evaluation and management based on above

## 2019-01-18 ENCOUNTER — Ambulatory Visit (INDEPENDENT_AMBULATORY_CARE_PROVIDER_SITE_OTHER): Payer: BC Managed Care – PPO

## 2019-01-18 DIAGNOSIS — E538 Deficiency of other specified B group vitamins: Secondary | ICD-10-CM | POA: Diagnosis not present

## 2019-01-18 MED ORDER — CYANOCOBALAMIN 1000 MCG/ML IJ SOLN
1000.0000 ug | Freq: Once | INTRAMUSCULAR | Status: AC
  Administered 2019-01-18: 09:00:00 1000 ug via INTRAMUSCULAR

## 2019-01-18 NOTE — Progress Notes (Signed)
b12 Injection given.   Yancey Pedley J Fidela Cieslak, MD  

## 2019-01-19 ENCOUNTER — Other Ambulatory Visit: Payer: BC Managed Care – PPO

## 2019-01-23 ENCOUNTER — Other Ambulatory Visit: Payer: Self-pay

## 2019-01-23 ENCOUNTER — Encounter: Payer: Self-pay | Admitting: Family

## 2019-01-23 ENCOUNTER — Ambulatory Visit (INDEPENDENT_AMBULATORY_CARE_PROVIDER_SITE_OTHER): Payer: BC Managed Care – PPO | Admitting: Family

## 2019-01-23 ENCOUNTER — Other Ambulatory Visit (INDEPENDENT_AMBULATORY_CARE_PROVIDER_SITE_OTHER): Payer: BC Managed Care – PPO

## 2019-01-23 VITALS — BP 116/78 | HR 84 | Temp 98.2°F | Ht 67.0 in | Wt 231.6 lb

## 2019-01-23 DIAGNOSIS — E538 Deficiency of other specified B group vitamins: Secondary | ICD-10-CM | POA: Diagnosis not present

## 2019-01-23 DIAGNOSIS — E1122 Type 2 diabetes mellitus with diabetic chronic kidney disease: Secondary | ICD-10-CM | POA: Diagnosis not present

## 2019-01-23 DIAGNOSIS — N401 Enlarged prostate with lower urinary tract symptoms: Secondary | ICD-10-CM

## 2019-01-23 DIAGNOSIS — K4090 Unilateral inguinal hernia, without obstruction or gangrene, not specified as recurrent: Secondary | ICD-10-CM | POA: Diagnosis not present

## 2019-01-23 DIAGNOSIS — N183 Chronic kidney disease, stage 3 unspecified: Secondary | ICD-10-CM

## 2019-01-23 DIAGNOSIS — I10 Essential (primary) hypertension: Secondary | ICD-10-CM | POA: Diagnosis not present

## 2019-01-23 LAB — VITAMIN B12: Vitamin B-12: 602 pg/mL (ref 211–911)

## 2019-01-23 NOTE — Progress Notes (Signed)
Agree with treatment 

## 2019-01-23 NOTE — Progress Notes (Signed)
Austin Valencia. is a 53 y.o. male with the following history as recorded in EpicCare:  Patient Active Problem List   Diagnosis Date Noted  . Left lower quadrant abdominal pain 01/16/2019  . Vitamin B12 deficiency 12/24/2017  . Elevated PSA 12/24/2017  . Obesity (BMI 30-39.9) 04/21/2017  . Left inguinal hernia 03/12/2016  . Type 2 diabetes mellitus with stage 3 chronic kidney disease, without long-term current use of insulin ([place]) 06/22/2015  . 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 GERD 03/28/2009  . Hyperlipidemia 02/25/2009  . DEPRESSION 02/25/2009  . Essential hypertension 02/25/2009  . BPH (benign prostatic hyperplasia) 02/25/2009    Current Outpatient Medications  Medication Sig Dispense Refill  . alfuzosin (UROXATRAL) 10 MG 24 hr tablet Take 1 tablet (10 mg total) by mouth daily with breakfast. 90 tablet 3  . amLODipine (NORVASC) 5 MG tablet Take 1 tablet (5 mg total) by mouth daily. 90 tablet 3  . Blood Gluc Meter Disp-Strips (BLOOD GLUCOSE METER DISPOSABLE) DEVI Please given strips of type to work w/ pt's meter. Needs to check tid due to medication change, hyperglycemia 100 each 11  . Dulaglutide (TRULICITY) 1.5 0000000 SOPN Inject into the skin. Take weekly as directed    . fenofibrate (TRICOR) 145 MG tablet Take 1 tablet (145 mg total) by mouth daily. 90 tablet 1  . Insulin Pen Needle (PEN NEEDLES 31GX5/16") 31G X 8 MM MISC 1 pen needle per lantus injection 50 each 0  . metFORMIN (GLUCOPHAGE) 1000 MG tablet TAKE 1 TAB po bid 180 tablet 2  . Multiple Vitamin (MULTIVITAMIN) tablet Take 1 tablet by mouth daily.    Marland Kitchen omeprazole (PRILOSEC) 40 MG capsule Take 1 capsule (40 mg total) by mouth 2 (two) times a day. 180 capsule 1  . meloxicam (MOBIC) 15 MG tablet Take 1 tablet (15 mg total) by mouth daily. 30 tablet 0  . metoprolol succinate (TOPROL-XL) 50 MG 24 hr tablet Take 50 mg by mouth daily. Take with or immediately following a meal. Managed by cardiology- Dr. Terrence Dupont; patient is  unsure of doage    . metoprolol tartrate (LOPRESSOR) 25 MG tablet Take 25 mg by mouth 2 (two) times daily.     No current facility-administered medications for this visit.     Allergies: Losartan and Ace inhibitors  Past Medical History:  Diagnosis Date  . Diabetes mellitus without complication ([place]) 0000000  . GERD (gastroesophageal reflux disease)   . Headache(784.0)   . Hyperlipidemia   . Hypertension     Past Surgical History:  Procedure Laterality Date  . HERNIA REPAIR     umbilical child  . LIPOMA EXCISION Right 11/29/2013   Procedure: EXCISION LIPOMA RIGHT AXILLA;  Surgeon: Autumn Messing III, MD;  Location: [place];  Service: General;  Laterality: Right;    Family History  Problem Relation Age of Onset  . Cancer Father        lung  . Diabetes Father   . Hypertension Father   . Hyperlipidemia Mother   . Hypertension Mother   . Colon cancer Paternal Uncle        24's  . Colon polyps Neg Hx   . Esophageal cancer Neg Hx   . Rectal cancer Neg Hx   . Stomach cancer Neg Hx     Social History   Tobacco Use  . Smoking status: Never Smoker  . Smokeless tobacco: Never Used  Substance Use Topics  . Alcohol use: No    Alcohol/week:  0.0 standard drinks    Subjective:  1 month follow-up on chronic care needs:  1) HTN- stable; patient is on Metoprolol per his cardiologist but unsure of the dosage; says he is only taking once per day/ ? If Lopressor or Toprol XL; 2) Type 2 Diabetes- most recent Hgba1c indicates good control at 6.7; 3) Known prostate enlargement- sees [facility] yearly; 4) CT done last week showed bilateral hernias- would like to see surgeon; 5) Low B12- has done 4 weeks of injections;     Objective:  Vitals:   01/23/19 0914  BP: 116/78  Pulse: 84  Temp: 98.2 F (36.8 C)  TempSrc: Oral  SpO2: 97%  Weight: 231 lb 9.6 oz (105.1 kg)  Height: 5\' 7"  (1.702 m)    General: Well developed, well nourished, in no acute distress  Skin  : Warm and dry.  Head: Normocephalic and atraumatic  Lungs: Respirations unlabored; clear to auscultation bilaterally without wheeze, rales, rhonchi  CVS exam: normal rate and regular rhythm.  Abdomen: Soft; nontender; nondistended; normoactive bowel sounds; no masses or hepatosplenomegaly  Musculoskeletal: No deformities; no active joint inflammation  Extremities: No edema, cyanosis, clubbing  Vessels: Symmetric bilaterally  Neurologic: Alert and oriented; speech intact; face symmetrical; moves all extremities well; CNII-XII intact without focal deficit   Assessment:  1. Inguinal hernia without obstruction or gangrene, recurrence not specified, unspecified laterality   2. B12 deficiency   3. Essential hypertension   4. Type 2 diabetes mellitus with stage 3 chronic kidney disease, without long-term current use of insulin, unspecified whether stage 3a or 3b CKD ([place])   5. Benign prostatic hyperplasia with lower urinary tract symptoms, symptom details unspecified     Plan:  1. Reviewed CT with patient today; will refer to urology; 2. Check B12 level today- will determine follow-up; 3. Stable; need to get patient to clarify dosage of Metoprolol; 4. Stable; continue same medications; 5. Continue with urology as scheduled;   No follow-ups on file.  Orders Placed This Encounter  Procedures  . B12    Standing Status:   Future    Standing Expiration Date:   01/23/2020  . Ambulatory referral to General Surgery    Referral Priority:   Routine    Referral Type:   Surgical    Referral Reason:   Specialty Services Required    Requested Specialty:   General Surgery    Number of Visits Requested:   1    Requested Prescriptions    No prescriptions requested or ordered in this encounter

## 2019-02-23 ENCOUNTER — Other Ambulatory Visit: Payer: Self-pay

## 2019-02-23 ENCOUNTER — Encounter (HOSPITAL_COMMUNITY): Payer: Self-pay | Admitting: Emergency Medicine

## 2019-02-23 ENCOUNTER — Ambulatory Visit (HOSPITAL_COMMUNITY)
Admission: EM | Admit: 2019-02-23 | Discharge: 2019-02-23 | Disposition: A | Discharge status: Home / Self Care | Payer: BC Managed Care – PPO | Attending: Family Medicine | Admitting: Family Medicine

## 2019-02-23 DIAGNOSIS — Z20828 Contact with and (suspected) exposure to other viral communicable diseases: Secondary | ICD-10-CM | POA: Diagnosis present

## 2019-02-23 DIAGNOSIS — J0100 Acute maxillary sinusitis, unspecified: Secondary | ICD-10-CM | POA: Diagnosis present

## 2019-02-23 DIAGNOSIS — U071 COVID-19: Secondary | ICD-10-CM | POA: Diagnosis not present

## 2019-02-23 DIAGNOSIS — Z20822 Contact with and (suspected) exposure to covid-19: Secondary | ICD-10-CM

## 2019-02-23 MED ORDER — FLUTICASONE PROPIONATE 50 MCG/ACT NA SUSP: 2.0000 | Freq: Every day | NASAL | 2 refills | Status: AC

## 2019-02-23 MED ORDER — AMOXICILLIN 875 MG PO TABS: 875.0000 mg | ORAL_TABLET | Freq: Two times a day (BID) | ORAL | 0 refills | Status: DC

## 2019-02-23 NOTE — Discharge Instructions (Signed)
Drink plenty of fluids Take antibiotic 2 times a day Use the Flonase twice a day until symptoms improve Tylenol for pain or fever Check my chart for your coronavirus test result Quarantine at home until your test result is available Call for questions or problems

## 2019-02-23 NOTE — ED Triage Notes (Signed)
Pt reports sinus congestion x3-4 weeks.  Pt states he has felt feverish at times, but he gets his temperature checked at work everyday and he has not had a fever.  He also reports some loss of taste and smell that started yesterday.  Pt denies N, V, D, or h/a.

## 2019-02-23 NOTE — ED Provider Notes (Signed)
[facility]    CSN: [id number] Arrival date & time: 02/23/19  1408      History   Chief Complaint Chief Complaint  Patient presents with  . sinus pressure    HPI Austin Valencia. is a 53 y.o. male.   HPI  Pleasant 41 year old man.  Works as a Sports coach.  No known exposure to Covid.  Low risk occupation.  Lives alone.  At Thanksgiving with only his son.  Son is asymptomatic.  He has had sinus infection symptoms for over 2 weeks.  It is getting worse.  He has thick drainage.  Postnasal drip.  Runny nose.  Thick yellow.  He has lost a sense of taste and smell.  He does not notice from the sinus infection or whether it could be Covid.  He is requesting Covid testing. No fever chills.  No body aches. Well-controlled diabetes.  Well-controlled hypertension and high cholesterol  Past Medical History:  Diagnosis Date  . Diabetes mellitus without complication ([place]) 0000000  . GERD (gastroesophageal reflux disease)   . Headache(784.0)   . Hyperlipidemia   . Hypertension     Patient Active Problem List   Diagnosis Date Noted  . Left lower quadrant abdominal pain 01/16/2019  . Vitamin B12 deficiency 12/24/2017  . Elevated PSA 12/24/2017  . Obesity (BMI 30-39.9) 04/21/2017  . Left inguinal hernia 03/12/2016  . Type 2 diabetes mellitus with stage 3 chronic kidney disease, without long-term current use of insulin ([place]) 06/22/2015  . 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 GERD 03/28/2009  . Hyperlipidemia 02/25/2009  . DEPRESSION 02/25/2009  . Essential hypertension 02/25/2009  . BPH (benign prostatic hyperplasia) 02/25/2009    Past Surgical History:  Procedure Laterality Date  . HERNIA REPAIR     umbilical child  . LIPOMA EXCISION Right 11/29/2013   Procedure: EXCISION LIPOMA RIGHT AXILLA;  Surgeon: Autumn Messing III, MD;  Location: [place];  Service: General;  Laterality: Right;       Home Medications    Prior to Admission medications   Medication Sig  Start Date End Date Taking? Authorizing Provider  alfuzosin (UROXATRAL) 10 MG 24 hr tablet Take 1 tablet (10 mg total) by mouth daily with breakfast. 05/19/18  Yes Shawnee Knapp, MD  amLODipine (NORVASC) 5 MG tablet Take 1 tablet (5 mg total) by mouth daily. 05/19/18  Yes Shawnee Knapp, MD  Blood Gluc Meter Disp-Strips (BLOOD GLUCOSE METER DISPOSABLE) DEVI Please given strips of type to work w/ pt's meter. Needs to check tid due to medication change, hyperglycemia 06/12/16  Yes Shawnee Knapp, MD  Dulaglutide (TRULICITY) 1.5 0000000 SOPN Inject into the skin. Take weekly as directed   Yes [provider]  fenofibrate (TRICOR) 145 MG tablet Take 1 tablet (145 mg total) by mouth daily. 05/19/18  Yes Shawnee Knapp, MD  Insulin Pen Needle (PEN NEEDLES 31GX5/16") 31G X 8 MM MISC 1 pen needle per lantus injection 06/11/18  Yes Rutherford Guys, MD  meloxicam (MOBIC) 15 MG tablet Take 1 tablet (15 mg total) by mouth daily. 01/16/19  Yes Binnie Rail, MD  metFORMIN (GLUCOPHAGE) 1000 MG tablet TAKE 1 TAB po bid 05/19/18  Yes Shawnee Knapp, MD  Multiple Vitamin (MULTIVITAMIN) tablet Take 1 tablet by mouth daily.   Yes [provider]  omeprazole (PRILOSEC) 40 MG capsule Take 1 capsule (40 mg total) by mouth 2 (two) times a day. 09/05/18  Yes Armbruster, Carlota Raspberry, MD  amoxicillin (AMOXIL) 875 MG tablet Take 1 tablet (875 mg total) by mouth 2 (two) times daily. 02/23/19   Raylene Everts, MD  fluticasone [facility]) 50 MCG/ACT nasal spray Place 2 sprays into both nostrils daily. 02/23/19   Raylene Everts, MD  metoprolol succinate (TOPROL-XL) 50 MG 24 hr tablet Take 50 mg by mouth daily. Take with or immediately following a meal. Managed by cardiology- Dr. Terrence Dupont; patient is unsure of doage    [provider]  metoprolol tartrate (LOPRESSOR) 25 MG tablet Take 25 mg by mouth 2 (two) times daily.    [provider]    Family History Family History  Problem Relation Age of Onset  .  Cancer Father        lung  . Diabetes Father   . Hypertension Father   . Hyperlipidemia Mother   . Hypertension Mother   . Colon cancer Paternal Uncle        8's  . Colon polyps Neg Hx   . Esophageal cancer Neg Hx   . Rectal cancer Neg Hx   . Stomach cancer Neg Hx     Social History Social History   Tobacco Use  . Smoking status: Never Smoker  . Smokeless tobacco: Never Used  Substance Use Topics  . Alcohol use: No    Alcohol/week: 0.0 standard drinks  . Drug use: No     Allergies   Losartan and Ace inhibitors   Review of Systems Review of Systems  Constitutional: Positive for appetite change. Negative for chills and fever.  HENT: Positive for congestion, postnasal drip, rhinorrhea, sinus pressure, sinus pain and sore throat. Negative for ear pain.   Eyes: Negative for pain and visual disturbance.  Respiratory: Negative for cough and shortness of breath.   Cardiovascular: Negative for chest pain and palpitations.  Gastrointestinal: Negative for abdominal pain, nausea and vomiting.  Genitourinary: Negative for dysuria and hematuria.  Musculoskeletal: Negative for arthralgias and back pain.  Skin: Negative for color change and rash.  Neurological: Negative for seizures, syncope and headaches.  All other systems reviewed and are negative.    Physical Exam Triage Vital Signs ED Triage Vitals  Enc Vitals Group     BP 02/23/19 1501 127/82     Pulse Rate 02/23/19 1501 90     Resp 02/23/19 1501 12     Temp 02/23/19 1501 98.9 F (37.2 C)     Temp Source 02/23/19 1501 Oral     SpO2 02/23/19 1501 96 %     Weight --      Height --      Head Circumference --      Peak Flow --      Pain Score 02/23/19 1504 10     Pain Loc --      Pain Edu? --      Excl. in [place]? --    No data found.  Updated Vital Signs BP 127/82 (BP Location: Left Arm)   Pulse 90   Temp 98.9 F (37.2 C) (Oral)   Resp 12   SpO2 96%      Physical Exam Constitutional:      General: He is  not in acute distress.    Appearance: He is well-developed.  HENT:     Head: Normocephalic and atraumatic.     Right Ear: Tympanic membrane and ear canal normal.     Left Ear: Tympanic membrane normal.     Nose: Congestion present.     Comments:  Maxillary sinus tenderness, mild.  Nasal membranes are swollen and red    Mouth/Throat:     Mouth: Mucous membranes are moist.     Pharynx: Posterior oropharyngeal erythema present.  Eyes:     Conjunctiva/sclera: Conjunctivae normal.     Pupils: Pupils are equal, round, and reactive to light.  Neck:     Musculoskeletal: Normal range of motion.  Cardiovascular:     Rate and Rhythm: Normal rate and regular rhythm.     Heart sounds: Normal heart sounds.  Pulmonary:     Effort: Pulmonary effort is normal. No respiratory distress.     Breath sounds: Normal breath sounds.  Abdominal:     General: There is no distension.     Palpations: Abdomen is soft.  Musculoskeletal: Normal range of motion.  Lymphadenopathy:     Cervical: No cervical adenopathy.  Skin:    General: Skin is warm and dry.  Neurological:     Mental Status: He is alert.  Psychiatric:        Mood and Affect: Mood normal.        Behavior: Behavior normal.      UC Treatments / Results  Labs (all labs ordered are listed, but only abnormal results are displayed) Labs Reviewed  NOVEL CORONAVIRUS, NAA (HOSP ORDER, SEND-OUT TO REF LAB; TAT 18-24 HRS)    EKG   Radiology No results found.  Procedures Procedures (including critical care time)  Medications Ordered in UC Medications - No data to display  Initial Impression / Assessment and Plan / UC Course  I have reviewed the triage vital signs and the nursing notes.  Pertinent labs & imaging results that were available during my care of the patient were reviewed by me and considered in my medical decision making (see chart for details).     Sinus infection symptoms are usually caused by a virus.  They should be  better by now, after 2 weeks especially with worsening symptoms I am concerned he may need antibiotics.  Less likely concern for coronavirus.  Send out test is done.  He is advised to quarantine until his test result is available Final Clinical Impressions(s) / UC Diagnoses   Final diagnoses:  Suspected COVID-19 virus infection  Acute non-recurrent maxillary sinusitis     Discharge Instructions     Drink plenty of fluids Take antibiotic 2 times a day Use the Flonase twice a day until symptoms improve Tylenol for pain or fever Check my chart for your coronavirus test result Quarantine at home until your test result is available Call for questions or problems   ED Prescriptions    Medication Sig Dispense Auth. Provider   fluticasone (FLONASE) 50 MCG/ACT nasal spray Place 2 sprays into both nostrils daily. 16 g Raylene Everts, MD   amoxicillin (AMOXIL) 875 MG tablet Take 1 tablet (875 mg total) by mouth 2 (two) times daily. 14 tablet Raylene Everts, MD     PDMP not reviewed this encounter.   Raylene Everts, MD 02/23/19 [phone]

## 2019-02-24 LAB — NOVEL CORONAVIRUS, NAA (HOSP ORDER, SEND-OUT TO REF LAB; TAT 18-24 HRS): SARS-CoV-2, NAA: DETECTED — AB

## 2019-02-25 ENCOUNTER — Telehealth: Payer: Self-pay | Admitting: Nurse Practitioner

## 2019-02-25 NOTE — Telephone Encounter (Signed)
Attempt to call patient about Covid symptoms and the use of bamlanivimab, a monoclonal antibody infusion for those with mild to moderate Covid symptoms and at a high risk of hospitalization.  Pt is qualified for this infusion at the [facility] infusion center due to T2DM, obesity, and HTN.  No answer, left a HIPAA compliant message.

## 2019-02-27 ENCOUNTER — Telehealth (HOSPITAL_COMMUNITY): Payer: Self-pay | Admitting: Emergency Medicine

## 2019-02-27 NOTE — Telephone Encounter (Signed)
Your test for COVID-19 was positive, meaning that you were infected with the novel coronavirus and could give the germ to others.  Please continue isolation at home for at least 10 days since the start of your symptoms. If you do not have symptoms, please isolate at home for 10 days from the day you were tested. Once you complete your 10 day quarantine, you may return to normal activities as long as you've not had a fever for over 24 hours(without taking fever reducing medicine) and your symptoms are improving. Please continue good preventive care measures, including:  frequent hand-washing, avoid touching your face, cover coughs/sneezes, stay out of crowds and keep a 6 foot distance from others.  Go to the nearest hospital emergency room if fever/cough/breathlessness are severe or illness seems like a threat to life.  Patient contacted by phone and made aware of    results. Pt verbalized understanding and had all questions answered. Quarantine ends Dec 13th

## 2019-04-07 ENCOUNTER — Encounter: Payer: Self-pay | Admitting: Family

## 2019-04-07 ENCOUNTER — Other Ambulatory Visit: Payer: Self-pay

## 2019-04-07 ENCOUNTER — Ambulatory Visit (INDEPENDENT_AMBULATORY_CARE_PROVIDER_SITE_OTHER): Payer: BC Managed Care – PPO | Admitting: Family

## 2019-04-07 VITALS — BP 120/82 | HR 96 | Temp 97.9°F | Ht 67.0 in | Wt 233.2 lb

## 2019-04-07 DIAGNOSIS — I10 Essential (primary) hypertension: Secondary | ICD-10-CM | POA: Diagnosis not present

## 2019-04-07 DIAGNOSIS — E1122 Type 2 diabetes mellitus with diabetic chronic kidney disease: Secondary | ICD-10-CM | POA: Diagnosis not present

## 2019-04-07 DIAGNOSIS — N183 Chronic kidney disease, stage 3 unspecified: Secondary | ICD-10-CM

## 2019-04-07 DIAGNOSIS — E538 Deficiency of other specified B group vitamins: Secondary | ICD-10-CM

## 2019-04-07 DIAGNOSIS — K4090 Unilateral inguinal hernia, without obstruction or gangrene, not specified as recurrent: Secondary | ICD-10-CM | POA: Diagnosis not present

## 2019-04-07 DIAGNOSIS — M25511 Pain in right shoulder: Secondary | ICD-10-CM

## 2019-04-07 DIAGNOSIS — N401 Enlarged prostate with lower urinary tract symptoms: Secondary | ICD-10-CM

## 2019-04-07 LAB — VITAMIN B12: Vitamin B-12: 395 pg/mL (ref 211–911)

## 2019-04-07 LAB — CBC WITH DIFFERENTIAL/PLATELET
Basophils Absolute: 0.1 10*3/uL (ref 0.0–0.1)
Basophils Relative: 1.5 % (ref 0.0–3.0)
Eosinophils Absolute: 0.1 10*3/uL (ref 0.0–0.7)
Eosinophils Relative: 2.1 % (ref 0.0–5.0)
HCT: 40.2 % (ref 39.0–52.0)
Hemoglobin: 13.8 g/dL (ref 13.0–17.0)
Lymphocytes Relative: 29.8 % (ref 12.0–46.0)
Lymphs Abs: 1.5 10*3/uL (ref 0.7–4.0)
MCHC: 34.4 g/dL (ref 30.0–36.0)
MCV: 94.3 fl (ref 78.0–100.0)
Monocytes Absolute: 0.3 10*3/uL (ref 0.1–1.0)
Monocytes Relative: 6.7 % (ref 3.0–12.0)
Neutro Abs: 3.1 10*3/uL (ref 1.4–7.7)
Neutrophils Relative %: 59.9 % (ref 43.0–77.0)
Platelets: 219 10*3/uL (ref 150.0–400.0)
RBC: 4.27 Mil/uL (ref 4.22–5.81)
RDW: 14.6 % (ref 11.5–15.5)
WBC: 5.2 10*3/uL (ref 4.0–10.5)

## 2019-04-07 LAB — COMPREHENSIVE METABOLIC PANEL
ALT: 22 U/L (ref 0–53)
AST: 22 U/L (ref 0–37)
Albumin: 4.6 g/dL (ref 3.5–5.2)
Alkaline Phosphatase: 58 U/L (ref 39–117)
BUN: 10 mg/dL (ref 6–23)
CO2: 27 mEq/L (ref 19–32)
Calcium: 9.2 mg/dL (ref 8.4–10.5)
Chloride: 102 mEq/L (ref 96–112)
Creatinine, Ser: 1.4 mg/dL (ref 0.40–1.50)
GFR: 64.56 mL/min (ref >60.00)
Glucose, Bld: 170 mg/dL — ABNORMAL HIGH (ref 70–99)
Potassium: 3.8 mEq/L (ref 3.5–5.1)
Sodium: 138 mEq/L (ref 135–145)
Total Bilirubin: 0.4 mg/dL (ref 0.2–1.2)
Total Protein: 6.8 g/dL (ref 6.0–8.3)

## 2019-04-07 LAB — HEMOGLOBIN A1C: Hgb A1c MFr Bld: 6.4 % (ref 4.6–6.5)

## 2019-04-07 MED ORDER — FENOFIBRATE 145 MG PO TABS: 145.0000 mg | ORAL_TABLET | Freq: Every day | ORAL | 3 refills | Status: DC

## 2019-04-07 MED ORDER — ALFUZOSIN HCL ER 10 MG PO TB24: 10.0000 mg | ORAL_TABLET | Freq: Every day | ORAL | 3 refills | Status: AC

## 2019-04-07 MED ORDER — TRULICITY 1.5 MG/0.5ML ~~LOC~~ SOAJ: SUBCUTANEOUS | 5 refills | Status: DC

## 2019-04-07 MED ORDER — METFORMIN HCL 1000 MG PO TABS: ORAL_TABLET | ORAL | 3 refills | Status: AC

## 2019-04-07 MED ORDER — AMLODIPINE BESYLATE 5 MG PO TABS: 5.0000 mg | ORAL_TABLET | Freq: Every day | ORAL | 3 refills | Status: DC

## 2019-04-07 NOTE — Patient Instructions (Signed)
Please schedule a follow-up with Dr. Noemi Chapel about your continued shoulder pain;

## 2019-04-07 NOTE — Progress Notes (Signed)
Austin Valencia. is a 54 y.o. male with the following history as recorded in EpicCare:  Patient Active Problem List   Diagnosis Date Noted  . Morbid obesity ([employer]) 02/25/2019  . Left lower quadrant abdominal pain 01/16/2019  . Vitamin B12 deficiency 12/24/2017  . Elevated PSA 12/24/2017  . BMI 36.0-36.9,adult 04/21/2017  . Left inguinal hernia 03/12/2016  . Type 2 diabetes mellitus with stage 3 chronic kidney disease, without long-term current use of insulin ([place]) 06/22/2015  . 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 GERD 03/28/2009  . Hyperlipidemia 02/25/2009  . DEPRESSION 02/25/2009  . Essential hypertension 02/25/2009  . BPH (benign prostatic hyperplasia) 02/25/2009    Current Outpatient Medications  Medication Sig Dispense Refill  . alfuzosin (UROXATRAL) 10 MG 24 hr tablet Take 1 tablet (10 mg total) by mouth daily with breakfast. 90 tablet 3  . amLODipine (NORVASC) 5 MG tablet Take 1 tablet (5 mg total) by mouth daily. 90 tablet 3  . Blood Gluc Meter Disp-Strips (BLOOD GLUCOSE METER DISPOSABLE) DEVI Please given strips of type to work w/ pt's meter. Needs to check tid due to medication change, hyperglycemia 100 each 11  . Dulaglutide (TRULICITY) 1.5 KF/2.7MD SOPN Take weekly as directed 4 pen 5  . fenofibrate (TRICOR) 145 MG tablet Take 1 tablet (145 mg total) by mouth daily. 90 tablet 3  . fluticasone (FLONASE) 50 MCG/ACT nasal spray Place 2 sprays into both nostrils daily. 16 g 2  . meloxicam (MOBIC) 15 MG tablet Take 1 tablet (15 mg total) by mouth daily. 30 tablet 0  . metFORMIN (GLUCOPHAGE) 1000 MG tablet TAKE 1 TAB po bid 180 tablet 3  . metoprolol succinate (TOPROL-XL) 25 MG 24 hr tablet Take 25 mg by mouth daily.    . Multiple Vitamin (MULTIVITAMIN) tablet Take 1 tablet by mouth daily.    Marland Kitchen omeprazole (PRILOSEC) 40 MG capsule Take 1 capsule (40 mg total) by mouth 2 (two) times a day. 180 capsule 1   No current facility-administered medications for this visit.    Allergies:  Losartan and Ace inhibitors  Past Medical History:  Diagnosis Date  . Diabetes mellitus without complication ([place]) 47/0929  . GERD (gastroesophageal reflux disease)   . Headache(784.0)   . Hyperlipidemia   . Hypertension     Past Surgical History:  Procedure Laterality Date  . HERNIA REPAIR     umbilical child  . LIPOMA EXCISION Right 11/29/2013   Procedure: EXCISION LIPOMA RIGHT AXILLA;  Surgeon: Autumn Messing III, MD;  Location: [place];  Service: General;  Laterality: Right;    Family History  Problem Relation Age of Onset  . Cancer Father        lung  . Diabetes Father   . Hypertension Father   . Hyperlipidemia Mother   . Hypertension Mother   . Colon cancer Paternal Uncle        52's  . Colon polyps Neg Hx   . Esophageal cancer Neg Hx   . Rectal cancer Neg Hx   . Stomach cancer Neg Hx     Social History   Tobacco Use  . Smoking status: Never Smoker  . Smokeless tobacco: Never Used  Substance Use Topics  . Alcohol use: No    Alcohol/week: 0.0 standard drinks    Subjective:   3 month follow-up on chronic care needs; requesting refills on his medications; Denies any chest pain, shortness of breath, blurred vision or headache. 1) Hypertension- stable; continuing to work with  Dr. Terrence Dupont; 2) Type 2 Diabetes- excellent control- last Hgba1c at 6.7; 3) Known prostate enlargement- sees [facility] yearly; 4) Will be scheduling follow up with surgeon about his bilateral hernias; previous scheduling had to be changed due to COVID diagnosis in early December.  5) History of low B12- now taking oral supplement; needs to have B12 level checked today.    Objective:  Vitals:   04/07/19 0918  BP: 120/82  Pulse: 96  Temp: 97.9 F (36.6 C)  TempSrc: Oral  SpO2: 99%  Weight: 233 lb 3.2 oz (105.8 kg)  Height: 5' 7"  (1.702 m)    General: Well developed, well nourished, in no acute distress  Skin : Warm and dry.  Head: Normocephalic and atraumatic   Lungs: Respirations unlabored; clear to auscultation bilaterally without wheeze, rales, rhonchi  CVS exam: normal rate and regular rhythm.  Neurologic: Alert and oriented; speech intact; face symmetrical; moves all extremities well; CNII-XII intact without focal deficit   Assessment:  1. Type 2 diabetes mellitus with stage 3 chronic kidney disease, without long-term current use of insulin, unspecified whether stage 3a or 3b CKD ([place])   2. Vitamin B12 deficiency   3. Essential hypertension   4. Inguinal hernia without obstruction or gangrene, recurrence not specified, unspecified laterality   5. Benign prostatic hyperplasia with lower urinary tract symptoms, symptom details unspecified   6. Right shoulder pain, unspecified chronicity     Plan:  1. Stable; check Hgba1c today; follow-up in 4 months; 2. Check B12 level today; 3. Stable; refills updated; 4. Encouraged patient to re-schedule with surgeon; 5. Encouraged to follow-up with his urologist for yearly check up 6. Encouraged to go back to his orthopedist who he saw last fall.   This visit occurred during the SARS-CoV-2 public health emergency.  Safety protocols were in place, including screening questions prior to the visit, additional usage of staff PPE, and extensive cleaning of exam room while observing appropriate contact time as indicated for disinfecting solutions.    No follow-ups on file.  Orders Placed This Encounter  Procedures  . CBC w/Diff    Standing Status:   Future    Number of Occurrences:   1    Standing Expiration Date:   04/06/2020  . Comp Met (CMET)  . B12  . HgB A1c    Requested Prescriptions   Signed Prescriptions Disp Refills  . Dulaglutide (TRULICITY) 1.5 OI/3.7CW SOPN 4 pen 5    Sig: Take weekly as directed  . metFORMIN (GLUCOPHAGE) 1000 MG tablet 180 tablet 3    Sig: TAKE 1 TAB po bid  . alfuzosin (UROXATRAL) 10 MG 24 hr tablet 90 tablet 3    Sig: Take 1 tablet (10 mg total) by mouth daily with  breakfast.  . fenofibrate (TRICOR) 145 MG tablet 90 tablet 3    Sig: Take 1 tablet (145 mg total) by mouth daily.  Marland Kitchen amLODipine (NORVASC) 5 MG tablet 90 tablet 3    Sig: Take 1 tablet (5 mg total) by mouth daily.

## 2019-04-24 ENCOUNTER — Ambulatory Visit: Payer: BC Managed Care – PPO | Admitting: Family

## 2019-05-27 ENCOUNTER — Other Ambulatory Visit: Payer: Self-pay | Admitting: Gastroenterology

## 2019-06-15 ENCOUNTER — Ambulatory Visit: Payer: BC Managed Care – PPO | Attending: Internal Medicine

## 2019-06-15 DIAGNOSIS — Z23 Encounter for immunization: Secondary | ICD-10-CM

## 2019-06-15 NOTE — Progress Notes (Signed)
   [id number] [facility]  Name:  Haytham Vairo.    MRN: [id number] DOB: 1965/05/15  06/15/2019  Mr. Lindroth was observed post Covid-19 immunization for 15 minutes without incident. He was provided with Vaccine Information Sheet and instruction to access the V-Safe system.   Mr. Arens was instructed to call 911 with any severe reactions post vaccine: Marland Kitchen Difficulty breathing  . Swelling of face and throat  . A fast heartbeat  . A bad rash all over body  . Dizziness and weakness   Immunizations Administered    Name Date Dose VIS Date Route   Pfizer COVID-19 Vaccine 06/15/2019  9:33 AM 0.3 mL 03/03/2019 Intramuscular   Manufacturer: [place]   Lot: [id number]   [place]: [id number]

## 2019-07-10 ENCOUNTER — Ambulatory Visit: Payer: BC Managed Care – PPO | Attending: Internal Medicine

## 2019-07-10 DIAGNOSIS — Z23 Encounter for immunization: Secondary | ICD-10-CM

## 2019-07-10 NOTE — Progress Notes (Signed)
   [id number] [facility]  Name:  Austin Valencia.    MRN: [id number] DOB: 09-25-1965  07/10/2019  Austin Valencia was observed post Covid-19 immunization for 15 minutes without incident. He was provided with Vaccine Information Sheet and instruction to access the V-Safe system.   Austin Valencia was instructed to call 911 with any severe reactions post vaccine: Marland Kitchen Difficulty breathing  . Swelling of face and throat  . A fast heartbeat  . A bad rash all over body  . Dizziness and weakness   Immunizations Administered    Name Date Dose VIS Date Route   Pfizer COVID-19 Vaccine 07/10/2019  9:35 AM 0.3 mL 05/17/2018 Intramuscular   Manufacturer: [place]   Lot: [id number]   [place]: [id number]

## 2019-08-07 ENCOUNTER — Encounter: Payer: Self-pay | Admitting: Family

## 2019-08-07 ENCOUNTER — Other Ambulatory Visit: Payer: Self-pay

## 2019-08-07 ENCOUNTER — Ambulatory Visit: Payer: BC Managed Care – PPO | Admitting: Family

## 2019-08-07 VITALS — BP 114/72 | HR 84 | Temp 98.0°F | Ht 67.0 in | Wt 215.2 lb

## 2019-08-07 DIAGNOSIS — R634 Abnormal weight loss: Secondary | ICD-10-CM

## 2019-08-07 DIAGNOSIS — N183 Chronic kidney disease, stage 3 unspecified: Secondary | ICD-10-CM

## 2019-08-07 DIAGNOSIS — E538 Deficiency of other specified B group vitamins: Secondary | ICD-10-CM | POA: Diagnosis not present

## 2019-08-07 DIAGNOSIS — R5383 Other fatigue: Secondary | ICD-10-CM | POA: Diagnosis not present

## 2019-08-07 DIAGNOSIS — R350 Frequency of micturition: Secondary | ICD-10-CM

## 2019-08-07 DIAGNOSIS — N401 Enlarged prostate with lower urinary tract symptoms: Secondary | ICD-10-CM | POA: Diagnosis not present

## 2019-08-07 DIAGNOSIS — E1122 Type 2 diabetes mellitus with diabetic chronic kidney disease: Secondary | ICD-10-CM

## 2019-08-07 LAB — CBC WITH DIFFERENTIAL/PLATELET
Basophils Absolute: 0 10*3/uL (ref 0.0–0.1)
Basophils Relative: 0.7 % (ref 0.0–3.0)
Eosinophils Absolute: 0.1 10*3/uL (ref 0.0–0.7)
Eosinophils Relative: 1.8 % (ref 0.0–5.0)
HCT: 40.6 % (ref 39.0–52.0)
Hemoglobin: 14 g/dL (ref 13.0–17.0)
Lymphocytes Relative: 30.2 % (ref 12.0–46.0)
Lymphs Abs: 1.4 10*3/uL (ref 0.7–4.0)
MCHC: 34.6 g/dL (ref 30.0–36.0)
MCV: 93.1 fl (ref 78.0–100.0)
Monocytes Absolute: 0.3 10*3/uL (ref 0.1–1.0)
Monocytes Relative: 7.1 % (ref 3.0–12.0)
Neutro Abs: 2.8 10*3/uL (ref 1.4–7.7)
Neutrophils Relative %: 60.2 % (ref 43.0–77.0)
Platelets: 232 10*3/uL (ref 150.0–400.0)
RBC: 4.36 Mil/uL (ref 4.22–5.81)
RDW: 13.1 % (ref 11.5–15.5)
WBC: 4.6 10*3/uL (ref 4.0–10.5)

## 2019-08-07 LAB — COMPREHENSIVE METABOLIC PANEL
ALT: 17 U/L (ref 0–53)
AST: 15 U/L (ref 0–37)
Albumin: 4.3 g/dL (ref 3.5–5.2)
Alkaline Phosphatase: 96 U/L (ref 39–117)
BUN: 12 mg/dL (ref 6–23)
CO2: 26 mEq/L (ref 19–32)
Calcium: 8.8 mg/dL (ref 8.4–10.5)
Chloride: 95 mEq/L — ABNORMAL LOW (ref 96–112)
Creatinine, Ser: 1.48 mg/dL (ref 0.40–1.50)
GFR: 60.47 mL/min (ref >60.00)
Glucose, Bld: 438 mg/dL — ABNORMAL HIGH (ref 70–99)
Potassium: 3.8 mEq/L (ref 3.5–5.1)
Sodium: 133 mEq/L — ABNORMAL LOW (ref 135–145)
Total Bilirubin: 0.4 mg/dL (ref 0.2–1.2)
Total Protein: 6.7 g/dL (ref 6.0–8.3)

## 2019-08-07 LAB — HEMOGLOBIN A1C: Hgb A1c MFr Bld: 10.9 % — ABNORMAL HIGH (ref 4.6–6.5)

## 2019-08-07 LAB — AMYLASE: Amylase: 31 U/L (ref 27–131)

## 2019-08-07 LAB — LIPASE: Lipase: 39 U/L (ref 11.0–59.0)

## 2019-08-07 NOTE — Progress Notes (Signed)
Austin Valencia. is a 54 y.o. male with the following history as recorded in EpicCare:  Patient Active Problem List   Diagnosis Date Noted  . Morbid obesity ([place]) 02/25/2019  . Left lower quadrant abdominal pain 01/16/2019  . Vitamin B12 deficiency 12/24/2017  . Elevated PSA 12/24/2017  . BMI 36.0-36.9,adult 04/21/2017  . Left inguinal hernia 03/12/2016  . Type 2 diabetes mellitus with stage 3 chronic kidney disease, without long-term current use of insulin ([place]) 06/22/2015  . 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 GERD 03/28/2009  . Hyperlipidemia 02/25/2009  . DEPRESSION 02/25/2009  . Essential hypertension 02/25/2009  . BPH (benign prostatic hyperplasia) 02/25/2009    Current Outpatient Medications  Medication Sig Dispense Refill  . alfuzosin (UROXATRAL) 10 MG 24 hr tablet Take 1 tablet (10 mg total) by mouth daily with breakfast. 90 tablet 3  . amLODipine (NORVASC) 5 MG tablet Take 1 tablet (5 mg total) by mouth daily. 90 tablet 3  . Blood Gluc Meter Disp-Strips (BLOOD GLUCOSE METER DISPOSABLE) DEVI Please given strips of type to work w/ pt's meter. Needs to check tid due to medication change, hyperglycemia 100 each 11  . Dulaglutide (TRULICITY) 1.5 QH/4.7ML SOPN Take weekly as directed 4 pen 5  . fenofibrate (TRICOR) 145 MG tablet Take 1 tablet (145 mg total) by mouth daily. 90 tablet 3  . fluticasone (FLONASE) 50 MCG/ACT nasal spray Place 2 sprays into both nostrils daily. 16 g 2  . meloxicam (MOBIC) 15 MG tablet Take 1 tablet (15 mg total) by mouth daily. 30 tablet 0  . metFORMIN (GLUCOPHAGE) 1000 MG tablet TAKE 1 TAB po bid 180 tablet 3  . metoprolol succinate (TOPROL-XL) 25 MG 24 hr tablet Take 25 mg by mouth daily.    . Multiple Vitamin (MULTIVITAMIN) tablet Take 1 tablet by mouth daily.    Marland Kitchen omeprazole (PRILOSEC) 40 MG capsule Take 1 capsule by mouth twice daily 180 capsule 0   No current facility-administered medications for this visit.    Allergies: Losartan and Ace  inhibitors  Past Medical History:  Diagnosis Date  . Diabetes mellitus without complication ([place]) 46/5035  . GERD (gastroesophageal reflux disease)   . Headache(784.0)   . Hyperlipidemia   . Hypertension     Past Surgical History:  Procedure Laterality Date  . HERNIA REPAIR     umbilical child  . LIPOMA EXCISION Right 11/29/2013   Procedure: EXCISION LIPOMA RIGHT AXILLA;  Surgeon: Autumn Messing III, MD;  Location: [place];  Service: General;  Laterality: Right;    Family History  Problem Relation Age of Onset  . Cancer Father        lung  . Diabetes Father   . Hypertension Father   . Hyperlipidemia Mother   . Hypertension Mother   . Colon cancer Paternal Uncle        50's  . Colon polyps Neg Hx   . Esophageal cancer Neg Hx   . Rectal cancer Neg Hx   . Stomach cancer Neg Hx     Social History   Tobacco Use  . Smoking status: Never Smoker  . Smokeless tobacco: Never Used  Substance Use Topics  . Alcohol use: No    Alcohol/week: 0.0 standard drinks    Subjective:  4 month follow-up on Type 2 Diabetes; per patient, he has been working to lose weight; he notes however that even with weight loss, his blood sugars have been very high in the past month- feels that  they are averaging in the 300s; he notes this is actually normal for him- when he loses weight, his blood sugars will go up; he has been feeling more tired recently but denies any chest pain or shortness of breath on exertion; did not follow-up with his urologist last fall as recommended;   Objective:  Vitals:   08/07/19 0936  BP: 114/72  Pulse: 84  Temp: 98 F (36.7 C)  TempSrc: Oral  SpO2: 95%  Weight: 215 lb 3.2 oz (97.6 kg)  Height: 5' 7"  (1.702 m)    General: Well developed, well nourished, in no acute distress  Skin : Warm and dry.  Head: Normocephalic and atraumatic  Lungs: Respirations unlabored; clear to auscultation bilaterally without wheeze, rales, rhonchi  CVS exam: normal rate and  regular rhythm.  Neurologic: Alert and oriented; speech intact; face symmetrical; moves all extremities well; CNII-XII intact without focal deficit   Assessment:  1. Type 2 diabetes mellitus with stage 3 chronic kidney disease, without long-term current use of insulin, unspecified whether stage 3a or 3b CKD ([place])   2. Unexplained weight loss   3. Low vitamin B12 level   4. Benign prostatic hyperplasia with urinary frequency   5. Other fatigue     Plan:  Update labs today- will need to consider CXR as well if labs do not offer any indication as to source of weight loss; patient is not taking a specific B12 supplement- may need to re-start shots; follow-up to be determined.  This visit occurred during the SARS-CoV-2 public health emergency.  Safety protocols were in place, including screening questions prior to the visit, additional usage of staff PPE, and extensive cleaning of exam room while observing appropriate contact time as indicated for disinfecting solutions.     No follow-ups on file.  Orders Placed This Encounter  Procedures  . CBC with Differential/Platelet  . Comp Met (CMET)  . Amylase  . Lipase  . HgB A1c  . B12  . PSA  . TSH    Requested Prescriptions    No prescriptions requested or ordered in this encounter

## 2019-08-08 ENCOUNTER — Telehealth: Payer: Self-pay

## 2019-08-08 LAB — PSA: PSA: 3.43 ng/mL (ref 0.10–4.00)

## 2019-08-08 LAB — VITAMIN B12: Vitamin B-12: 262 pg/mL (ref 211–911)

## 2019-08-08 LAB — TSH: TSH: 1.51 u[IU]/mL (ref 0.35–4.50)

## 2019-08-08 NOTE — Telephone Encounter (Signed)
Lab results not back yet. Will call patient once Mickel Baas has looked at them.

## 2019-08-08 NOTE — Telephone Encounter (Signed)
New message    Calling for test results  

## 2019-08-09 ENCOUNTER — Other Ambulatory Visit: Payer: Self-pay | Admitting: Family

## 2019-08-09 MED ORDER — INSULIN PEN NEEDLE 32G X 6 MM MISC: 0 refills | Status: DC

## 2019-08-09 MED ORDER — LANTUS SOLOSTAR 100 UNIT/ML ~~LOC~~ SOPN: 15.0000 [IU] | PEN_INJECTOR | Freq: Every day | SUBCUTANEOUS | 1 refills | Status: DC

## 2019-09-08 ENCOUNTER — Other Ambulatory Visit: Payer: Self-pay | Admitting: Family

## 2019-09-08 ENCOUNTER — Ambulatory Visit (INDEPENDENT_AMBULATORY_CARE_PROVIDER_SITE_OTHER): Payer: BC Managed Care – PPO | Admitting: Family

## 2019-09-08 ENCOUNTER — Other Ambulatory Visit: Payer: Self-pay

## 2019-09-08 VITALS — BP 120/72 | HR 82 | Temp 98.3°F | Ht 67.0 in | Wt 210.0 lb

## 2019-09-08 DIAGNOSIS — R634 Abnormal weight loss: Secondary | ICD-10-CM

## 2019-09-08 DIAGNOSIS — I10 Essential (primary) hypertension: Secondary | ICD-10-CM | POA: Diagnosis not present

## 2019-09-08 DIAGNOSIS — E1122 Type 2 diabetes mellitus with diabetic chronic kidney disease: Secondary | ICD-10-CM

## 2019-09-08 DIAGNOSIS — N183 Chronic kidney disease, stage 3 unspecified: Secondary | ICD-10-CM | POA: Diagnosis not present

## 2019-09-08 LAB — COMPREHENSIVE METABOLIC PANEL
ALT: 14 U/L (ref 0–53)
AST: 12 U/L (ref 0–37)
Albumin: 4.5 g/dL (ref 3.5–5.2)
Alkaline Phosphatase: 75 U/L (ref 39–117)
BUN: 13 mg/dL (ref 6–23)
CO2: 28 mEq/L (ref 19–32)
Calcium: 9.3 mg/dL (ref 8.4–10.5)
Chloride: 104 mEq/L (ref 96–112)
Creatinine, Ser: 1.28 mg/dL (ref 0.40–1.50)
GFR: 71.48 mL/min (ref >60.00)
Glucose, Bld: 255 mg/dL — ABNORMAL HIGH (ref 70–99)
Potassium: 3.9 mEq/L (ref 3.5–5.1)
Sodium: 135 mEq/L (ref 135–145)
Total Bilirubin: 0.6 mg/dL (ref 0.2–1.2)
Total Protein: 6.8 g/dL (ref 6.0–8.3)

## 2019-09-08 LAB — CBC WITH DIFFERENTIAL/PLATELET
Basophils Absolute: 0 10*3/uL (ref 0.0–0.1)
Basophils Relative: 0.7 % (ref 0.0–3.0)
Eosinophils Absolute: 0.1 10*3/uL (ref 0.0–0.7)
Eosinophils Relative: 1.3 % (ref 0.0–5.0)
HCT: 40 % (ref 39.0–52.0)
Hemoglobin: 13.9 g/dL (ref 13.0–17.0)
Lymphocytes Relative: 24.1 % (ref 12.0–46.0)
Lymphs Abs: 1.2 10*3/uL (ref 0.7–4.0)
MCHC: 34.7 g/dL (ref 30.0–36.0)
MCV: 93.2 fl (ref 78.0–100.0)
Monocytes Absolute: 0.4 10*3/uL (ref 0.1–1.0)
Monocytes Relative: 7.7 % (ref 3.0–12.0)
Neutro Abs: 3.4 10*3/uL (ref 1.4–7.7)
Neutrophils Relative %: 66.2 % (ref 43.0–77.0)
Platelets: 241 10*3/uL (ref 150.0–400.0)
RBC: 4.29 Mil/uL (ref 4.22–5.81)
RDW: 13.5 % (ref 11.5–15.5)
WBC: 5.1 10*3/uL (ref 4.0–10.5)

## 2019-09-08 MED ORDER — LANTUS SOLOSTAR 100 UNIT/ML ~~LOC~~ SOPN: 10.0000 [IU] | PEN_INJECTOR | Freq: Every day | SUBCUTANEOUS | 1 refills | Status: DC

## 2019-09-08 MED ORDER — INSULIN PEN NEEDLE 32G X 6 MM MISC: 0 refills | Status: AC

## 2019-09-08 NOTE — Progress Notes (Signed)
Austin Valencia. is a 54 y.o. male with the following history as recorded in EpicCare:  Patient Active Problem List   Diagnosis Date Noted   Morbid obesity ([place]) 02/25/2019   Left lower quadrant abdominal pain 01/16/2019   Vitamin B12 deficiency 12/24/2017   Elevated PSA 12/24/2017   BMI 36.0-36.9,adult 04/21/2017   Left inguinal hernia 03/12/2016   Type 2 diabetes mellitus with stage 3 chronic kidney disease, without long-term current use of insulin ([place]) 06/22/2015   Lipoma of axilla 05/23/2013   GERD 03/28/2009   Hyperlipidemia 02/25/2009   DEPRESSION 02/25/2009   Essential hypertension 02/25/2009   BPH (benign prostatic hyperplasia) 02/25/2009    Current Outpatient Medications  Medication Sig Dispense Refill   alfuzosin (UROXATRAL) 10 MG 24 hr tablet Take 1 tablet (10 mg total) by mouth daily with breakfast. 90 tablet 3   amLODipine (NORVASC) 5 MG tablet Take 1 tablet (5 mg total) by mouth daily. 90 tablet 3   Blood Gluc Meter Disp-Strips (BLOOD GLUCOSE METER DISPOSABLE) DEVI Please given strips of type to work w/ pt's meter. Needs to check tid due to medication change, hyperglycemia 100 each 11   Dulaglutide (TRULICITY) 1.5 JO/8.4ZY SOPN Take weekly as directed 4 pen 5   fenofibrate (TRICOR) 145 MG tablet Take 1 tablet (145 mg total) by mouth daily. 90 tablet 3   fluticasone (FLONASE) 50 MCG/ACT nasal spray Place 2 sprays into both nostrils daily. 16 g 2   insulin glargine (LANTUS SOLOSTAR) 100 UNIT/ML Solostar Pen Inject 15 Units into the skin at bedtime. 5 pen 1   Insulin Pen Needle 32G X 6 MM MISC Inject daily as prescribed 100 each 0   meloxicam (MOBIC) 15 MG tablet Take 1 tablet (15 mg total) by mouth daily. 30 tablet 0   metFORMIN (GLUCOPHAGE) 1000 MG tablet TAKE 1 TAB po bid 180 tablet 3   metoprolol succinate (TOPROL-XL) 25 MG 24 hr tablet Take 25 mg by mouth daily.     Multiple Vitamin (MULTIVITAMIN) tablet Take 1 tablet by mouth daily.      omeprazole (PRILOSEC) 40 MG capsule Take 1 capsule by mouth twice daily 180 capsule 0   No current facility-administered medications for this visit.    Allergies: Losartan and Ace inhibitors  Past Medical History:  Diagnosis Date   Diabetes mellitus without complication ([place]) 60/6301   GERD (gastroesophageal reflux disease)    Headache(784.0)    Hyperlipidemia    Hypertension     Past Surgical History:  Procedure Laterality Date   HERNIA REPAIR     umbilical child   LIPOMA EXCISION Right 11/29/2013   Procedure: EXCISION LIPOMA RIGHT AXILLA;  Surgeon: Autumn Messing III, MD;  Location: [facility];  Service: General;  Laterality: Right;    Family History  Problem Relation Age of Onset   Cancer Father        lung   Diabetes Father    Hypertension Father    Hyperlipidemia Mother    Hypertension Mother    Colon cancer Paternal Uncle        74's   Colon polyps Neg Hx    Esophageal cancer Neg Hx    Rectal cancer Neg Hx    Stomach cancer Neg Hx     Social History   Tobacco Use   Smoking status: Never Smoker   Smokeless tobacco: Never Used  Substance Use Topics   Alcohol use: No    Alcohol/week: 0.0 standard drinks    Subjective:  1 month follow-up on Type 2 Diabetes; at last OV, hgba1c was noted to be quite elevated and he was prescribed  15 units of Lantus in addition to his Trulicity and Metformin; he has lost another 5 pounds since his last OV- per patient, he is actively trying to lose weight.   Of note, he did not start the insulin as  was prescribed.    Objective:  Vitals:   09/08/19 1017  BP: 120/72  Pulse: 82  Temp: 98.3 F (36.8 C)  TempSrc: Oral  SpO2: 97%  Weight: 210 lb (95.3 kg)  Height: 5' 7"  (1.702 m)    General: Well developed, well nourished, in no acute distress  Skin : Warm and dry.  Head: Normocephalic and atraumatic  Lungs: Respirations unlabored; clear to auscultation bilaterally without wheeze, rales, rhonchi   CVS exam: normal rate and regular rhythm.  Musculoskeletal: No deformities; no active joint inflammation  Extremities: No edema, cyanosis, clubbing  Vessels: Symmetric bilaterally  Neurologic: Alert and oriented; speech intact; face symmetrical; moves all extremities well; CNII-XII intact without focal deficit   Assessment:  1. Type 2 diabetes mellitus with stage 3 chronic kidney disease, without long-term current use of insulin, unspecified whether stage 3a or 3b CKD ([place])   2. Essential hypertension   3. Weight loss, unintentional     Plan:  1. Patient did not start insulin as prescribed; he notes he has discussed with his cardiologist and might prefer to try another pill instead; discussed my concern with the marked elevation in his Hgba1c; will re-check labs and make decision; also asked patient to always follow-up if he feels like a prescription is not available at the pharmacy. 2. Stable; 3. Per patient, he is actively trying to lose weight; he also notes his weight will go down when his diabetes is not controlled; follow-up to be determined;  This visit occurred during the SARS-CoV-2 public health emergency.  Safety protocols were in place, including screening questions prior to the visit, additional usage of staff PPE, and extensive cleaning of exam room while observing appropriate contact time as indicated for disinfecting solutions.      No follow-ups on file.  Orders Placed This Encounter  Procedures   Comp Met (CMET)   CBC with Differential/Platelet    Requested Prescriptions    No prescriptions requested or ordered in this encounter

## 2019-09-11 ENCOUNTER — Ambulatory Visit: Payer: BC Managed Care – PPO | Admitting: Family

## 2019-09-24 ENCOUNTER — Other Ambulatory Visit: Payer: Self-pay | Admitting: Family

## 2019-10-16 ENCOUNTER — Other Ambulatory Visit: Payer: Self-pay | Admitting: Gastroenterology

## 2019-10-18 LAB — HM DIABETES EYE EXAM

## 2019-11-02 ENCOUNTER — Encounter: Payer: Self-pay | Admitting: Family

## 2019-11-02 NOTE — Progress Notes (Signed)
Outside notes received. Information abstracted. Notes sent to scan.  

## 2019-12-11 ENCOUNTER — Encounter: Payer: Self-pay | Admitting: Family

## 2019-12-11 ENCOUNTER — Ambulatory Visit (INDEPENDENT_AMBULATORY_CARE_PROVIDER_SITE_OTHER): Payer: BC Managed Care – PPO | Admitting: Family

## 2019-12-11 ENCOUNTER — Other Ambulatory Visit: Payer: Self-pay

## 2019-12-11 VITALS — BP 118/68 | HR 84 | Temp 98.5°F | Ht 67.0 in | Wt 220.6 lb

## 2019-12-11 DIAGNOSIS — E1122 Type 2 diabetes mellitus with diabetic chronic kidney disease: Secondary | ICD-10-CM | POA: Diagnosis not present

## 2019-12-11 DIAGNOSIS — Z23 Encounter for immunization: Secondary | ICD-10-CM | POA: Diagnosis not present

## 2019-12-11 DIAGNOSIS — N183 Chronic kidney disease, stage 3 unspecified: Secondary | ICD-10-CM | POA: Diagnosis not present

## 2019-12-11 DIAGNOSIS — I10 Essential (primary) hypertension: Secondary | ICD-10-CM | POA: Diagnosis not present

## 2019-12-11 NOTE — Addendum Note (Signed)
Addended by: Rolm Baptise on: 12/11/2019 10:38 AM   Modules accepted: Orders

## 2019-12-11 NOTE — Progress Notes (Signed)
Austin Valencia. is a 54 y.o. male with the following history as recorded in Epic[employer]:  Patient Active Problem List   Diagnosis Date Noted  . Morbid obesity ([place]) 02/25/2019  . Left lower quadrant abdominal pain 01/16/2019  . Vitamin B12 deficiency 12/24/2017  . Elevated PSA 12/24/2017  . BMI 36.0-36.9,adult 04/21/2017  . Left inguinal hernia 03/12/2016  . Type 2 diabetes mellitus with stage 3 chronic kidney disease, without long-term current use of insulin ([place]) 06/22/2015  . 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 GERD 03/28/2009  . Hyperlipidemia 02/25/2009  . DEPRESSION 02/25/2009  . Essential hypertension 02/25/2009  . BPH (benign prostatic hyperplasia) 02/25/2009    Current Outpatient Medications  Medication Sig Dispense Refill  . alfuzosin (UROXATRAL) 10 MG 24 hr tablet Take 1 tablet (10 mg total) by mouth daily with breakfast. 90 tablet 3  . amLODipine (NORVASC) 5 MG tablet Take 1 tablet (5 mg total) by mouth daily. 90 tablet 3  . Blood Gluc Meter Disp-Strips (BLOOD GLUCOSE METER DISPOSABLE) DEVI Please given strips of type to work w/ pt's meter. Needs to check tid due to medication change, hyperglycemia 100 each 11  . fenofibrate (TRICOR) 145 MG tablet Take 1 tablet (145 mg total) by mouth daily. 90 tablet 3  . fluticasone (FLONASE) 50 MCG/ACT nasal spray Place 2 sprays into both nostrils daily. 16 g 2  . JANUVIA 50 MG tablet Take 50 mg by mouth daily.    . meloxicam (MOBIC) 15 MG tablet Take 1 tablet (15 mg total) by mouth daily. 30 tablet 0  . meloxicam (MOBIC) 7.5 MG tablet Take 7.5 mg by mouth daily.    . metFORMIN (GLUCOPHAGE) 1000 MG tablet TAKE 1 TAB po bid 180 tablet 3  . metoprolol succinate (TOPROL-XL) 25 MG 24 hr tablet Take 25 mg by mouth daily.    . Multiple Vitamin (MULTIVITAMIN) tablet Take 1 tablet by mouth daily.    Marland Kitchen omeprazole (PRILOSEC) 40 MG capsule Take 1 capsule (40 mg total) by mouth 2 (two) times daily. Please schedule an office visit for further refills.  Thank you 60 capsule 1  . TRULICITY 1.5 PN/3.6RW SOPN TAKE WEEKLY AS DIRECTED 4 mL 2  . insulin glargine (LANTUS SOLOSTAR) 100 UNIT/ML Solostar Pen Inject 10 Units into the skin at bedtime. (Patient not taking: Reported on 12/11/2019) 5 pen 1  . Insulin Pen Needle 32G X 6 MM MISC Inject daily as prescribed (Patient not taking: Reported on 12/11/2019) 100 each 0   No current facility-administered medications for this visit.    Allergies: Losartan and Ace inhibitors  Past Medical History:  Diagnosis Date  . Diabetes mellitus without complication ([place]) 43/1540  . GERD (gastroesophageal reflux disease)   . Headache(784.0)   . Hyperlipidemia   . Hypertension     Past Surgical History:  Procedure Laterality Date  . HERNIA REPAIR     umbilical child  . LIPOMA EXCISION Right 11/29/2013   Procedure: EXCISION LIPOMA RIGHT AXILLA;  Surgeon: Autumn Messing III, MD;  Location: [facility];  Service: General;  Laterality: Right;    Family History  Problem Relation Age of Onset  . Cancer Father        lung  . Diabetes Father   . Hypertension Father   . Hyperlipidemia Mother   . Hypertension Mother   . Colon cancer Paternal Uncle        89's  . Colon polyps Neg Hx   . Esophageal cancer Neg Hx   .  Rectal cancer Neg Hx   . Stomach cancer Neg Hx     Social History   Tobacco Use  . Smoking status: Never Smoker  . Smokeless tobacco: Never Used  Substance Use Topics  . Alcohol use: No    Alcohol/week: 0.0 standard drinks    Subjective:  3 month follow-up on Type 2 diabetes; was asked to start using daily Lantus- patient did not start as requested; needs to get labs updated today; opted to see his former PCP instead to make changes and opted to start Jardiance instead per Dr. Zenia Resides recommendation;  Taking blood pressure medication as prescribed; Denies any chest pain, shortness of breath, blurred vision or headache. Has gained 10 pounds since last OV;  Objective:  Vitals:    12/11/19 0906  BP: 118/68  Pulse: 84  Temp: 98.5 F (36.9 C)  TempSrc: Oral  SpO2: 96%  Weight: 220 lb 9.6 oz (100.1 kg)  Height: 5' 7"  (1.702 m)    General: Well developed, well nourished, in no acute distress  Skin : Warm and dry.  Head: Normocephalic and atraumatic  Eyes: Sclera and conjunctiva clear; pupils round and reactive to light; extraocular movements intact  Ears: External normal; canals clear; tympanic membranes normal  Oropharynx: Pink, supple. No suspicious lesions  Neck: Supple without thyromegaly, adenopathy  Lungs: Respirations unlabored; clear to auscultation bilaterally without wheeze, rales, rhonchi  CVS exam: normal rate and regular rhythm.  Neurologic: Alert and oriented; speech intact; face symmetrical; moves all extremities well; CNII-XII intact without focal deficit   Assessment:  1. Type 2 diabetes mellitus with stage 3 chronic kidney disease, without long-term current use of insulin, unspecified whether stage 3a or 3b CKD ([place])   2. Essential hypertension   3. Morbid obesity ([place]) Chronic    Plan:  1. ? Control; patient opted not to start insulin- saw another provider and chose to try Jardiance instead; at this point, will plan to continue Metformin, Trulicity and Jardiance for diabetes management;  update labs today; asked patient to decide on one PCP and follow-up there regularly for management of his diabetes; 2. Stable; continue same medications; 3. Encouraged to work on [employer] loss/ exercise;  Flu shot given;   This visit occurred during the SARS-CoV-2 public health emergency.  Safety protocols were in place, including screening questions prior to the visit, additional usage of staff PPE, and extensive cleaning of exam room while observing appropriate contact time as indicated for disinfecting solutions.     No follow-ups on file.  Orders Placed This Encounter  Procedures  . Comp Met (CMET)    Standing Status:   Future    Standing Expiration  Date:   12/10/2020  . Hemoglobin A1c    Standing Status:   Future    Standing Expiration Date:   12/10/2020    Requested Prescriptions    No prescriptions requested or ordered in this encounter

## 2019-12-12 LAB — COMPREHENSIVE METABOLIC PANEL
AG Ratio: 2.1 (calc) (ref 1.0–2.5)
ALT: 16 U/L (ref 9–46)
AST: 17 U/L (ref 10–35)
Albumin: 4.6 g/dL (ref 3.6–5.1)
Alkaline phosphatase (APISO): 49 U/L (ref 35–144)
BUN/Creatinine Ratio: 8 (calc) (ref 6–22)
BUN: 13 mg/dL (ref 7–25)
CO2: 27 mmol/L (ref 20–32)
Calcium: 9.8 mg/dL (ref 8.6–10.3)
Chloride: 106 mmol/L (ref 98–110)
Creat: 1.65 mg/dL — ABNORMAL HIGH (ref 0.70–1.33)
Globulin: 2.2 g/dL (calc) (ref 1.9–3.7)
Glucose, Bld: 112 mg/dL — ABNORMAL HIGH (ref 65–99)
Potassium: 4.4 mmol/L (ref 3.5–5.3)
Sodium: 141 mmol/L (ref 135–146)
Total Bilirubin: 0.5 mg/dL (ref 0.2–1.2)
Total Protein: 6.8 g/dL (ref 6.1–8.1)

## 2019-12-12 LAB — HEMOGLOBIN A1C
Hgb A1c MFr Bld: 6.3 % of total Hgb — ABNORMAL HIGH (ref <5.7)
Mean Plasma Glucose: 134 (calc)
eAG (mmol/L): 7.4 (calc)

## 2019-12-23 ENCOUNTER — Other Ambulatory Visit: Payer: Self-pay | Admitting: Family

## 2020-01-20 ENCOUNTER — Ambulatory Visit: Payer: BC Managed Care – PPO

## 2020-01-21 ENCOUNTER — Other Ambulatory Visit: Payer: Self-pay | Admitting: Gastroenterology

## 2020-01-22 ENCOUNTER — Ambulatory Visit: Payer: BC Managed Care – PPO | Attending: Internal Medicine

## 2020-01-22 DIAGNOSIS — Z23 Encounter for immunization: Secondary | ICD-10-CM

## 2020-01-22 NOTE — Progress Notes (Signed)
   [id number]  Name:  Austin Valencia.    MRN: [id number] DOB: [birth year]  01/22/2020  Austin Valencia was observed post Covid-19 immunization for 15 minutes without incident. He was provided with Vaccine Information Sheet and instruction to access the V-Safe system.   Austin Valencia was instructed to call 911 with any severe reactions post vaccine: Marland Kitchen Difficulty breathing  . Swelling of face and throat  . A fast heartbeat  . A bad rash all over body  . Dizziness and weakness

## 2020-01-23 ENCOUNTER — Other Ambulatory Visit: Payer: Self-pay | Admitting: Gastroenterology

## 2020-01-26 ENCOUNTER — Other Ambulatory Visit: Payer: Self-pay | Admitting: Gastroenterology

## 2020-03-09 ENCOUNTER — Other Ambulatory Visit: Payer: Self-pay | Admitting: Family

## 2020-04-10 ENCOUNTER — Other Ambulatory Visit: Payer: Self-pay | Admitting: Family

## 2020-04-12 ENCOUNTER — Ambulatory Visit: Payer: BC Managed Care – PPO | Admitting: Family

## 2020-05-04 ENCOUNTER — Other Ambulatory Visit: Payer: Self-pay | Admitting: Family

## 2020-05-13 ENCOUNTER — Other Ambulatory Visit: Payer: Self-pay | Admitting: Family

## 2020-05-13 NOTE — Telephone Encounter (Signed)
Patient returned call. He can be reached at [phone]

## 2020-05-13 NOTE — Telephone Encounter (Signed)
LVM asking pt to return call to office to re: medication request for refill.

## 2020-05-13 NOTE — Telephone Encounter (Signed)
At his last OV in September, it was discussed that he needed to have one PCP managing his diabetes. Is he planning to work with Dr. Terrence Dupont? I just need to know how to manage his refill requests.

## 2020-05-30 ENCOUNTER — Other Ambulatory Visit: Payer: Self-pay | Admitting: Family

## 2020-05-30 NOTE — Telephone Encounter (Signed)
At his last OV, he had indicated he was going to have Dr. Terrence Dupont take over his diabetes management. Please verify this so we know how to handle refill requests.

## 2020-07-11 ENCOUNTER — Other Ambulatory Visit: Payer: Self-pay | Admitting: Family

## 2020-08-05 NOTE — Addendum Note (Signed)
Addended by: Kem Boroughs D on: 08/05/2020 05:01 PM   Modules accepted: Orders

## 2020-08-05 NOTE — Telephone Encounter (Signed)
Pt stated that he is getting his medications from his endocrinologist.  Pt also reports that he wants to have a primary provider at [facility].

## 2020-08-22 ENCOUNTER — Telehealth: Payer: Self-pay

## 2020-08-22 NOTE — Telephone Encounter (Signed)
Per previous PCP CMA, pt would like TOC to remain at [facility] location.  Unable to LVM due to full mailbox.

## 2020-08-23 ENCOUNTER — Other Ambulatory Visit: Payer: Self-pay | Admitting: Family

## 2020-08-27 NOTE — Telephone Encounter (Signed)
Pt confirms that he would like TOC to provider in this office.  Will call back with an available visit.

## 2020-11-21 IMAGING — CT CT ABD-PELV W/ CM
2 of 5 series · 16 of 46 positions shown, 18 images · IV contrast (OMNIPAQUE 300)
Comparison: 02/07/2016

CLINICAL DATA: Abdominal pain, 5 day history of worsening LEFT
lower quadrant pain question diverticulitis, constipation

EXAM:
CT ABDOMEN AND PELVIS WITH CONTRAST
TECHNIQUE: Multidetector CT imaging of the abdomen and pelvis was performed
using the standard protocol following bolus administration of
intravenous contrast. Sagittal and coronal MPR images reconstructed
from axial data set.
CONTRAST:  100mL OMNIPAQUE IOHEXOL 300 MG/ML SOLN IV. Dilute oral
contrast.

[Series 2: abd/pel w · axial · 0.75mm/px · z∈[-480,-50]mm · 13 of 96 slices shown, 15 images]
[im 5/96  soft-tissue]
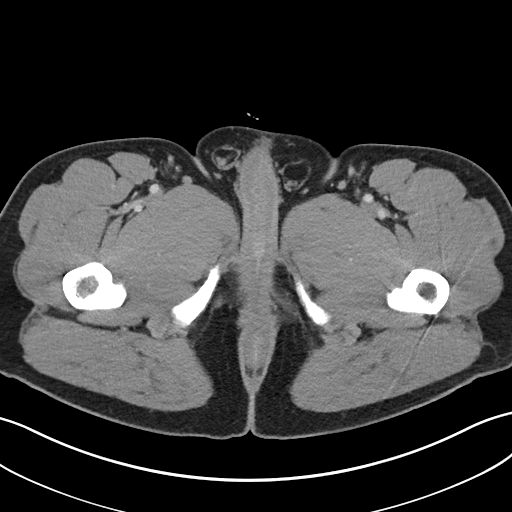
[im 5/96  bone]
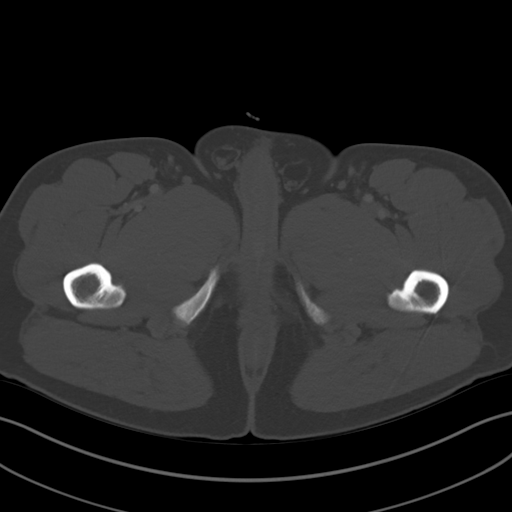
[im 15/96  soft-tissue]
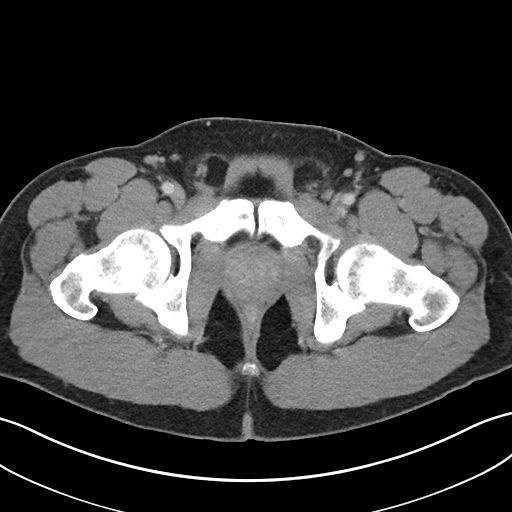
[im 20/96  soft-tissue]
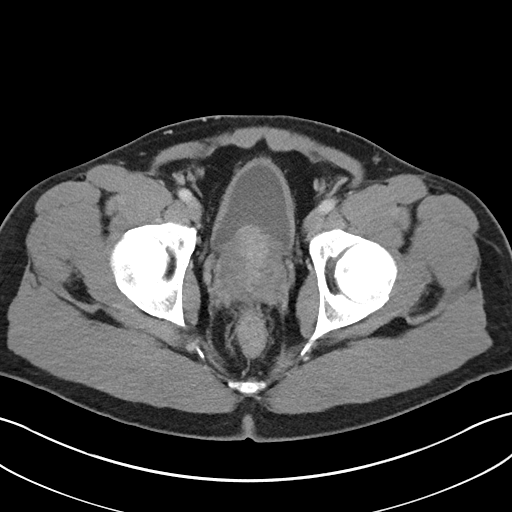
[im 29/96  soft-tissue]
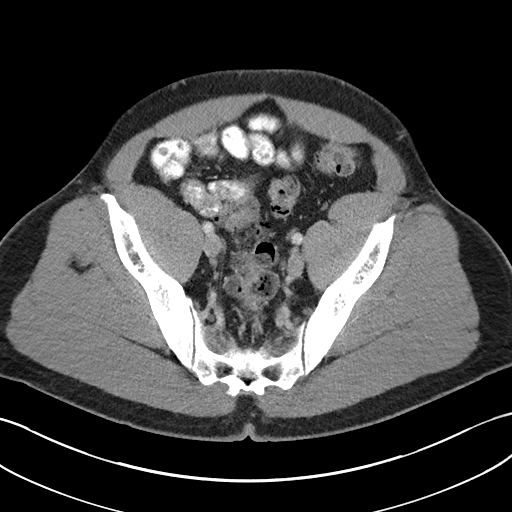
[im 34/96  soft-tissue]
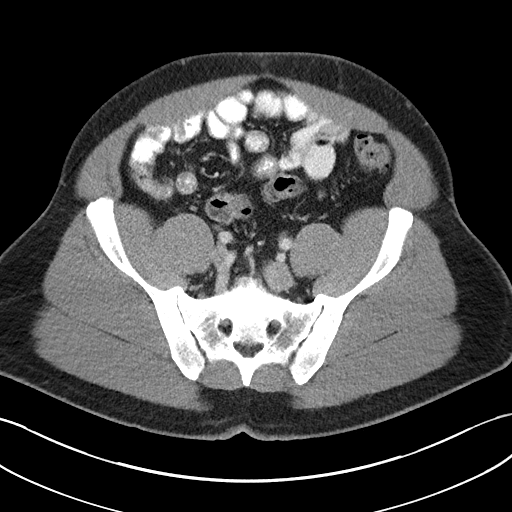
[im 43/96  soft-tissue]
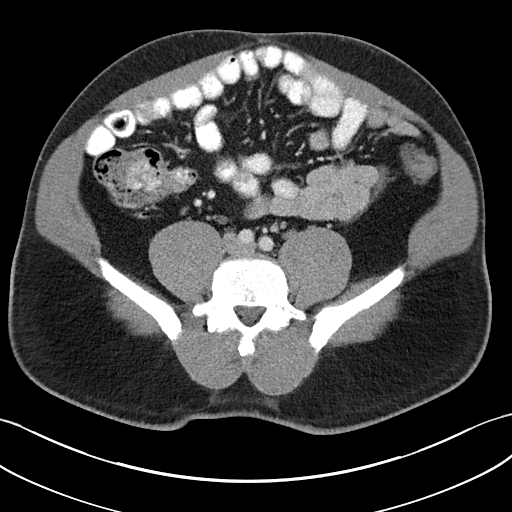
[im 48/96  soft-tissue]
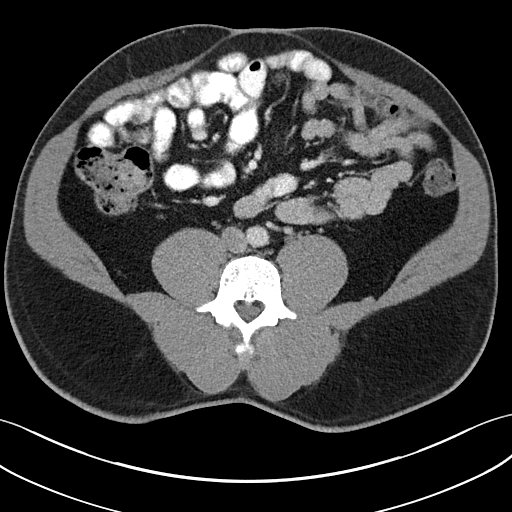
[im 53/96  soft-tissue]
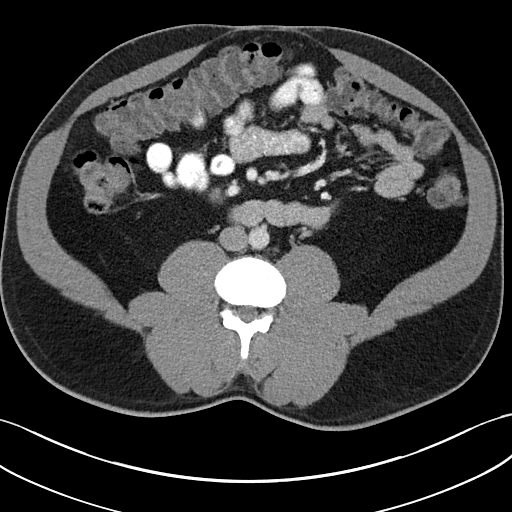
[im 62/96  soft-tissue]
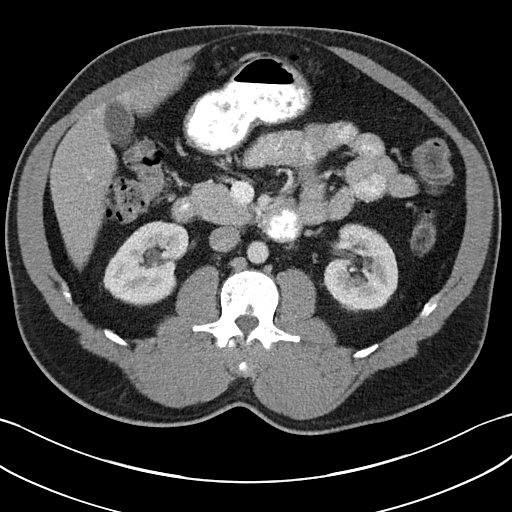
[im 62/96  bone]
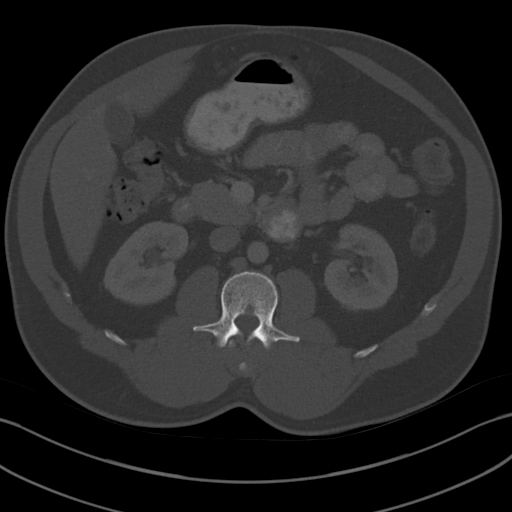
[im 67/96  soft-tissue]
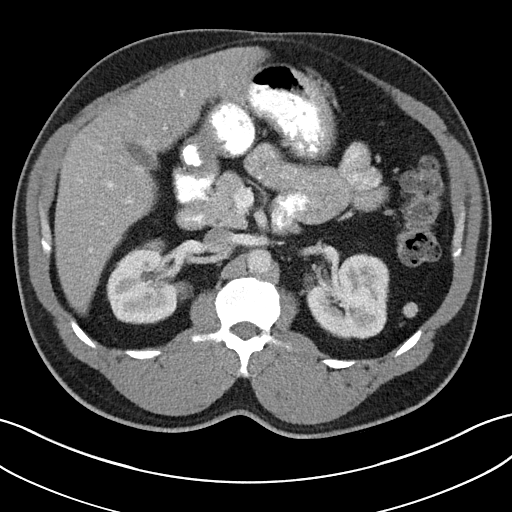
[im 77/96  soft-tissue]
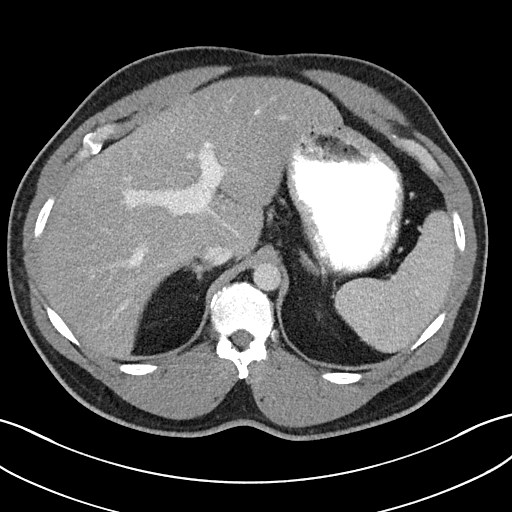
[im 81/96  soft-tissue]
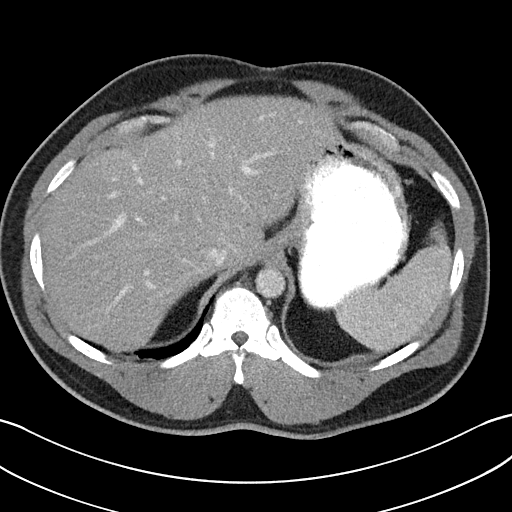
[im 91/96  soft-tissue]
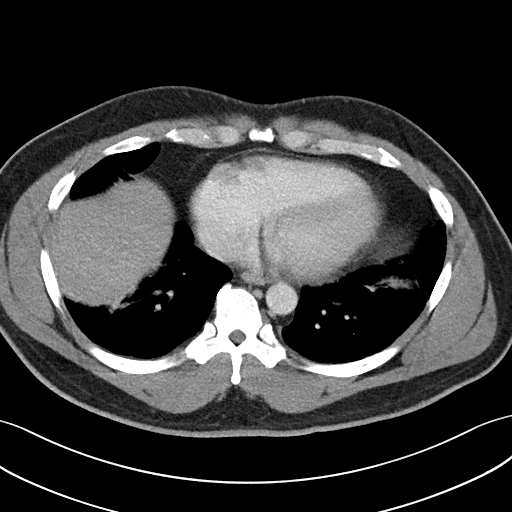

[Series 5: abd/pel w st · coronal · 0.73mm/px · 3 of 93 slices shown]
[im 31/93  soft-tissue]
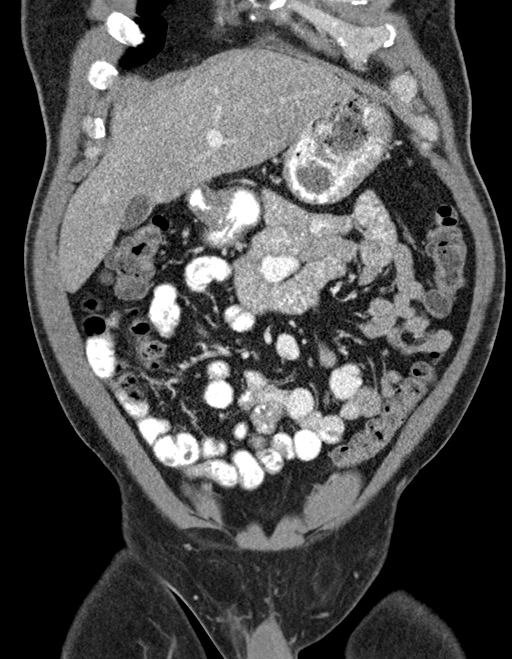
[im 41/93  soft-tissue]
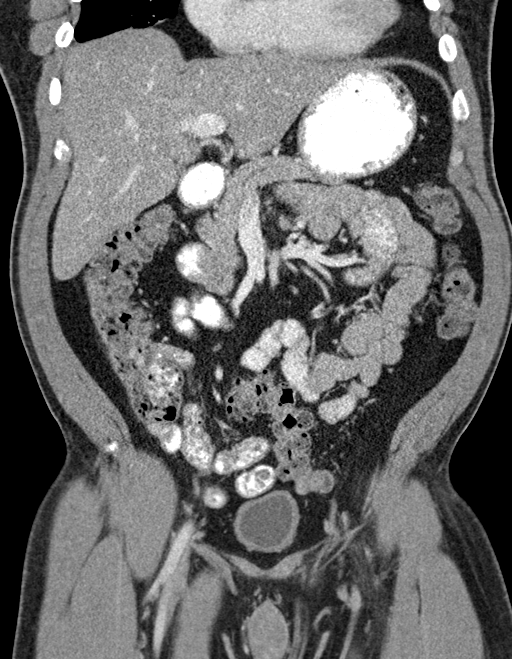
[im 52/93  soft-tissue]
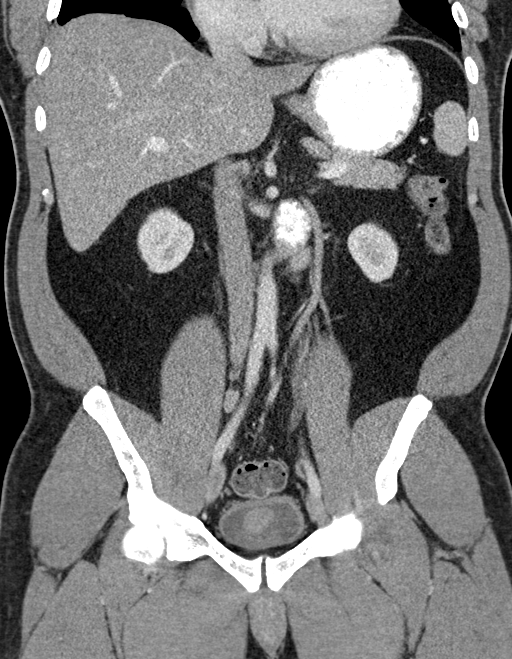

[16 of 46 positions shown; findings below may reference images not displayed]

FINDINGS: Lower chest: Subsegmental atelectasis at both lung bases

Hepatobiliary: Mild fatty infiltration of liver. Gallbladder and
liver otherwise unremarkable.

Pancreas: Normal appearance

Spleen: Normal appearance. Small splenule inferior to spleen.

Adrenals/Urinary Tract: Adrenal glands normal appearance. Tiny
BILATERAL nonobstructing renal calculi. Tiny cyst at upper pole
RIGHT kidney. Kidneys, ureters, and bladder otherwise normal
appearance. No ureteral calcification or dilatation.

Stomach/Bowel: Normal appendix. Stomach and bowel loops normal
appearance

Vascular/Lymphatic: Aorta normal caliber. Vascular structures
patent. No adenopathy.

Reproductive: Prostatic enlargement, gland 5.6 x 5.3 x 5.4 cm
(volume = 84 cm^3), indenting bladder base

Other: BILATERAL inguinal hernias containing fat larger on LEFT. No
free air or free fluid. No inflammatory process.

Musculoskeletal: Unremarkable
IMPRESSION: Tiny BILATERAL nonobstructing renal calculi.

Prostatic enlargement.

BILATERAL inguinal hernias containing fat, larger on LEFT.

No acute intra-abdominal or intrapelvic abnormalities.
